# Patient Record
Sex: Female | Born: 1980 | Race: Black or African American | Hispanic: No | Marital: Married | Smoking: Never smoker
Health system: Southern US, Community
[De-identification: ages and names within clinical notes are randomized; demographics above are authoritative.]

## PROBLEM LIST (undated history)

## (undated) ENCOUNTER — Encounter: Attending: Family Medicine | Primary: Family Medicine

## (undated) ENCOUNTER — Encounter

## (undated) ENCOUNTER — Telehealth

## (undated) ENCOUNTER — Ambulatory Visit

## (undated) ENCOUNTER — Ambulatory Visit: Payer: BLUE CROSS/BLUE SHIELD | Attending: Family Medicine | Primary: Family Medicine

## (undated) ENCOUNTER — Ambulatory Visit: Payer: PRIVATE HEALTH INSURANCE

## (undated) ENCOUNTER — Telehealth: Attending: Family Medicine | Primary: Family Medicine

## (undated) ENCOUNTER — Ambulatory Visit: Payer: BLUE CROSS/BLUE SHIELD | Attending: Diagnostic Radiology | Primary: Diagnostic Radiology

## (undated) ENCOUNTER — Ambulatory Visit: Attending: Pharmacist | Primary: Pharmacist

## (undated) ENCOUNTER — Telehealth: Attending: Rheumatology | Primary: Rheumatology

## (undated) ENCOUNTER — Ambulatory Visit: Payer: PRIVATE HEALTH INSURANCE | Attending: Family Medicine | Primary: Family Medicine

## (undated) ENCOUNTER — Ambulatory Visit: Payer: BLUE CROSS/BLUE SHIELD

## (undated) ENCOUNTER — Ambulatory Visit: Attending: Family Medicine | Primary: Family Medicine

## (undated) DIAGNOSIS — R7303 Prediabetes: Secondary | ICD-10-CM

## (undated) DIAGNOSIS — D241 Benign neoplasm of right breast: Secondary | ICD-10-CM

## (undated) DIAGNOSIS — E559 Vitamin D deficiency, unspecified: Secondary | ICD-10-CM

## (undated) DIAGNOSIS — D242 Benign neoplasm of left breast: Secondary | ICD-10-CM

## (undated) DIAGNOSIS — K219 Gastro-esophageal reflux disease without esophagitis: Secondary | ICD-10-CM

## (undated) DIAGNOSIS — D869 Sarcoidosis, unspecified: Secondary | ICD-10-CM

## (undated) HISTORY — PX: TONSILLECTOMY: SUR1361

## (undated) HISTORY — DX: Sarcoidosis, unspecified: D86.9

## (undated) HISTORY — PX: BREAST EXCISIONAL BIOPSY: SUR124

## (undated) HISTORY — DX: Gastro-esophageal reflux disease without esophagitis: K21.9

## (undated) HISTORY — PX: BREAST BIOPSY: SHX20

## (undated) HISTORY — DX: Benign neoplasm of left breast: D24.2

## (undated) HISTORY — DX: Prediabetes: R73.03

## (undated) HISTORY — DX: Benign neoplasm of right breast: D24.1

## (undated) HISTORY — DX: Vitamin D deficiency, unspecified: E55.9

---

## 2000-07-15 HISTORY — PX: OTHER SURGICAL HISTORY: SHX169

## 2004-07-15 HISTORY — PX: WISDOM TOOTH EXTRACTION: SHX21

## 2004-08-31 ENCOUNTER — Emergency Department: Payer: Self-pay | Admitting: Emergency Medicine

## 2005-07-15 HISTORY — PX: UPPER GI ENDOSCOPY: SHX6162

## 2005-07-15 HISTORY — PX: DILATION AND CURETTAGE OF UTERUS: SHX78

## 2006-01-01 ENCOUNTER — Ambulatory Visit: Payer: Self-pay | Admitting: Family Medicine

## 2006-02-06 ENCOUNTER — Ambulatory Visit: Payer: Self-pay | Admitting: Obstetrics and Gynecology

## 2007-03-23 ENCOUNTER — Ambulatory Visit: Payer: Self-pay | Admitting: Ophthalmology

## 2009-07-15 HISTORY — PX: COLONOSCOPY: SHX174

## 2009-08-22 ENCOUNTER — Ambulatory Visit: Payer: Self-pay | Admitting: Rheumatology

## 2010-03-13 ENCOUNTER — Ambulatory Visit: Payer: Self-pay | Admitting: Emergency Medicine

## 2010-09-13 ENCOUNTER — Ambulatory Visit: Payer: Self-pay | Admitting: Oncology

## 2010-10-05 ENCOUNTER — Ambulatory Visit: Payer: Self-pay | Admitting: Oncology

## 2010-10-14 ENCOUNTER — Ambulatory Visit: Payer: Self-pay | Admitting: Oncology

## 2010-11-13 ENCOUNTER — Ambulatory Visit: Payer: Self-pay | Admitting: Oncology

## 2010-12-14 ENCOUNTER — Ambulatory Visit: Payer: Self-pay | Admitting: Oncology

## 2011-01-18 ENCOUNTER — Ambulatory Visit: Payer: Self-pay | Admitting: Oncology

## 2011-02-13 ENCOUNTER — Ambulatory Visit: Payer: Self-pay | Admitting: Oncology

## 2011-03-16 ENCOUNTER — Ambulatory Visit: Payer: Self-pay | Admitting: Oncology

## 2011-05-15 ENCOUNTER — Ambulatory Visit: Payer: Self-pay | Admitting: Oncology

## 2011-05-16 ENCOUNTER — Ambulatory Visit: Payer: Self-pay | Admitting: Oncology

## 2011-06-26 ENCOUNTER — Ambulatory Visit: Payer: Self-pay | Admitting: Oncology

## 2011-07-16 ENCOUNTER — Ambulatory Visit: Payer: Self-pay | Admitting: Oncology

## 2011-08-28 ENCOUNTER — Ambulatory Visit: Payer: Self-pay | Admitting: Oncology

## 2011-09-13 ENCOUNTER — Ambulatory Visit: Payer: Self-pay | Admitting: Oncology

## 2011-10-23 ENCOUNTER — Ambulatory Visit: Payer: Self-pay | Admitting: Oncology

## 2011-11-13 ENCOUNTER — Ambulatory Visit: Payer: Self-pay | Admitting: Oncology

## 2011-12-18 ENCOUNTER — Ambulatory Visit: Payer: Self-pay | Admitting: Oncology

## 2012-01-13 ENCOUNTER — Ambulatory Visit: Payer: Self-pay | Admitting: Oncology

## 2012-02-19 ENCOUNTER — Ambulatory Visit: Payer: Self-pay | Admitting: Oncology

## 2012-03-15 ENCOUNTER — Ambulatory Visit: Payer: Self-pay | Admitting: Oncology

## 2012-04-15 ENCOUNTER — Ambulatory Visit: Payer: Self-pay | Admitting: Oncology

## 2013-03-03 ENCOUNTER — Ambulatory Visit: Payer: Self-pay | Admitting: Otolaryngology

## 2015-02-13 ENCOUNTER — Other Ambulatory Visit: Payer: Self-pay | Admitting: Obstetrics and Gynecology

## 2015-02-13 DIAGNOSIS — N63 Unspecified lump in unspecified breast: Secondary | ICD-10-CM

## 2015-02-15 ENCOUNTER — Other Ambulatory Visit: Payer: Self-pay

## 2015-02-15 ENCOUNTER — Ambulatory Visit: Payer: Self-pay

## 2015-02-16 ENCOUNTER — Ambulatory Visit
Admission: RE | Admit: 2015-02-16 | Discharge: 2015-02-16 | Disposition: A | Discharge status: Home / Self Care | Payer: No Typology Code available for payment source | Source: Ambulatory Visit | Attending: Obstetrics and Gynecology | Admitting: Obstetrics and Gynecology

## 2015-02-16 DIAGNOSIS — N63 Unspecified lump in unspecified breast: Secondary | ICD-10-CM

## 2015-02-23 ENCOUNTER — Encounter: Payer: Self-pay | Admitting: *Deleted

## 2015-02-27 ENCOUNTER — Encounter: Payer: Self-pay | Admitting: General Surgery

## 2015-02-27 ENCOUNTER — Other Ambulatory Visit: Payer: PRIVATE HEALTH INSURANCE

## 2015-02-27 ENCOUNTER — Ambulatory Visit (INDEPENDENT_AMBULATORY_CARE_PROVIDER_SITE_OTHER): Payer: PRIVATE HEALTH INSURANCE | Admitting: General Surgery

## 2015-02-27 VITALS — BP 126/76 | Ht 70.0 in | Wt 219.0 lb

## 2015-02-27 DIAGNOSIS — N6320 Unspecified lump in the left breast, unspecified quadrant: Secondary | ICD-10-CM

## 2015-02-27 DIAGNOSIS — N6310 Unspecified lump in the right breast, unspecified quadrant: Secondary | ICD-10-CM

## 2015-02-27 DIAGNOSIS — I808 Phlebitis and thrombophlebitis of other sites: Secondary | ICD-10-CM

## 2015-02-27 DIAGNOSIS — N63 Unspecified lump in breast: Secondary | ICD-10-CM | POA: Diagnosis not present

## 2015-02-27 NOTE — Patient Instructions (Addendum)
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      CARE AFTER BREAST BIOPSY  1. Leave the dressing on that your doctor applied after surgery. It is waterproof. You may bathe, shower and/or swim. The dressing will probably remain intact until your return office visit. If the dressing comes off, you will see small strips of tape against your skin on the incision. Do not remove these strips.  2. You may want to use a gauze,cloth or similar protection in your bra to prevent rubbing against your dressing and incision. This is not necessary, but you may feel more comfortable doing so.  3. It is recommended that you wear a bra day and night to give support to the breast. This will prevent the weight of the breast from pulling on the incision.  4. Your breast will feel hard and lumpy under the incision. Do not be alarmed. This is the underlying stitching of tissue. Softening of this tissue will occur in time.  5. Make sure you call the office and schedule an appointment in one week after your surgery. The office phone number is [phone]. The nurses at [facility] may have already done this for you.  6. You will notice about a week after your office visit that the strips of the tape on your incision will begin to loosen. These may then be removed.  7. Report to your doctor any of the following:  * Severe pain not relieved by your pain medication  *Redness of the incision  * Drainage from the incision  *Fever greater than 101 degrees

## 2015-02-27 NOTE — Progress Notes (Signed)
Patient ID: Marie Moran, female   DOB: [birth year], 34 y.o.   MRN: [id number]  Chief Complaint  Patient presents with  . Follow-up    mammogram    HPI Marie Moran is a 34 y.o. female who presents for a breast evaluation. The most recent mammogram and left breast ultrasound was done on 02/16/15.  Patient does perform regular self breast checks and gets regular mammograms done.  Patient did feel a "knot" on her right breast near area of prior surgery and on her left breast about 6 months ago. Patient stated her left breast nipple is swollen and complains of pain and tenderness. She did feel area of concern prior to getting her mammogram.  , HPI  Past Medical History  Diagnosis Date  . Sarcoidosis     2009    Past Surgical History  Procedure Laterality Date  . Mass removed  2002  . Tonsillectomy    . Wisdom tooth extraction  2006  . Colonoscopy  2011    [facility]  . Breast biopsy Right neg    2002 Dr. Larinda Moran   . Upper gi endoscopy  2007  . Dilation and curettage of uterus  2007    Family History  Problem Relation Age of Onset  . Cancer Maternal Aunt     Breast   . Cancer Maternal Grandfather     lung  . Cancer Maternal Aunt     Breast     Social History Social History  Substance Use Topics  . Smoking status: Never Smoker   . Smokeless tobacco: None  . Alcohol Use: 0.0 oz/week    0 Standard drinks or equivalent per week    No Known Allergies  No current outpatient prescriptions on file.   No current facility-administered medications for this visit.    Review of Systems Review of Systems  Constitutional: Negative.   Respiratory: Negative.   Cardiovascular: Negative.     Blood pressure 126/76, height 5\' 10"  (1.778 m), weight 219 lb (99.338 kg), last menstrual period 01/25/2015.  Physical Exam Physical Exam  Constitutional: She is oriented to person, place, and time. She appears well-developed and well-nourished.  Eyes: Conjunctivae are  normal. No scleral icterus.  Neck: Neck supple.  Cardiovascular: Normal rate, regular rhythm and normal heart sounds.   Pulmonary/Chest: Effort normal and breath sounds normal. Right breast exhibits mass. Right breast exhibits no inverted nipple, no nipple discharge, no skin change and no tenderness. Left breast exhibits mass. Left breast exhibits no inverted nipple, no nipple discharge, no skin change and no tenderness.  3 cm mobile firm mass in left breast UOQ 2 cm mobile mass in right breast UOQ  Abdominal: Soft. Bowel sounds are normal. There is no tenderness.  Lymphadenopathy:    She has no cervical adenopathy.  Neurological: She is alert and oriented to person, place, and time.  Skin: Skin is warm and dry.    Data Reviewed Mammogram and [place] reviewed. 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 Also noted a 1 cm similar mass at 5ocl location near nipple on right  Assessment    1) Mass of right and left breast  2) Mondor's disease of left breast    Plan Recommended core biopsy of the large left breast mass. If no malignant features will plan for excision of all the masses in SDS. Pt advised fully and is agreeable.     US guided biopsy of left breast completed today,  specimen sent for  pathology Use ice pack and wear a snug bra for pain relief Take ibuprofen and use local heat for 5-7 days for Mondor's disease.      PCP:  Marie Moran 02/27/2015, 1:58 PM

## 2015-02-28 ENCOUNTER — Telehealth: Payer: Self-pay | Admitting: General Surgery

## 2015-02-28 NOTE — Telephone Encounter (Signed)
PT CALLED TODAY TO OBTAIN RESULTS FROM BR BX DONE 02-28-15 IN [facility].PLEASE GIVE PT A CALL.RESULTS ARE IN EPIC.

## 2015-02-28 NOTE — Telephone Encounter (Signed)
Notified patient as instructed, patient pleased. Discussed follow-up appointments, patient agrees  

## 2015-03-01 ENCOUNTER — Other Ambulatory Visit: Payer: Self-pay | Admitting: General Surgery

## 2015-03-01 ENCOUNTER — Telehealth: Payer: Self-pay | Admitting: *Deleted

## 2015-03-01 DIAGNOSIS — D241 Benign neoplasm of right breast: Secondary | ICD-10-CM

## 2015-03-01 DIAGNOSIS — D242 Benign neoplasm of left breast: Secondary | ICD-10-CM

## 2015-03-01 NOTE — Telephone Encounter (Signed)
Patient's surgery has been scheduled for 03-06-15 at [facility].

## 2015-03-01 NOTE — Telephone Encounter (Signed)
-----   Message from Christene Lye, MD sent at 02/28/2015  1:07 PM EDT ----- Please schedule excision breast masses bilateral in SDS. Left OR form on your desk.

## 2015-03-03 ENCOUNTER — Inpatient Hospital Stay
Admission: RE | Admit: 2015-03-03 | Discharge: 2015-03-03 | Disposition: A | Discharge status: Home / Self Care | Payer: No Typology Code available for payment source | Source: Ambulatory Visit

## 2015-03-03 DIAGNOSIS — Z803 Family history of malignant neoplasm of breast: Secondary | ICD-10-CM | POA: Diagnosis not present

## 2015-03-03 DIAGNOSIS — D869 Sarcoidosis, unspecified: Secondary | ICD-10-CM | POA: Diagnosis not present

## 2015-03-03 DIAGNOSIS — N62 Hypertrophy of breast: Secondary | ICD-10-CM | POA: Diagnosis not present

## 2015-03-03 DIAGNOSIS — D241 Benign neoplasm of right breast: Secondary | ICD-10-CM | POA: Diagnosis not present

## 2015-03-03 NOTE — Patient Instructions (Signed)
  Your procedure is scheduled on: 03/06/15 Mon  Report to Day Surgery. To find out your arrival time please call [phone] between 1PM - 3PM on 03/03/15 Fri.  Remember: Instructions that are not followed completely may result in serious medical risk, up to and including death, or upon the discretion of your surgeon and anesthesiologist your surgery may need to be rescheduled.    _x___ 1. Do not eat food or drink liquids after midnight. No gum chewing or hard candies.     ____ 2. No Alcohol for 24 hours before or after surgery.   ____ 3. Bring all medications with you on the day of surgery if instructed.    _x___ 4. Notify your doctor if there is any change in your medical condition     (cold, fever, infections).     Do not wear jewelry, make-up, hairpins, clips or nail polish.  Do not wear lotions, powders, or perfumes. You may wear deodorant.  Do not shave 48 hours prior to surgery. Men may shave face and neck.  Do not bring valuables to the hospital.    [facility] is not responsible for any belongings or valuables.               Contacts, dentures or bridgework may not be worn into surgery.  Leave your suitcase in the car. After surgery it may be brought to your room.  For patients admitted to the hospital, discharge time is determined by your                treatment team.   Patients discharged the day of surgery will not be allowed to drive home.   Please read over the following fact sheets that you were given:      ____ Take these medicines the morning of surgery with A SIP OF WATER:    1. None  2.   3.   4.  5.  6.  ____ Fleet Enema (as directed)   _x___ Use CHG Soap as directed Use Sage wipes day of surgery  ____ Use inhalers on the day of surgery  ____ Stop metformin 2 days prior to surgery    ____ Take 1/2 of usual insulin dose the night before surgery and none on the morning of surgery.   ____ Stop Coumadin/Plavix/aspirin on   ____ Stop  Anti-inflammatories on    ____ Stop supplements until after surgery.    ____ Bring C-Pap to the hospital.

## 2015-03-06 ENCOUNTER — Encounter
Admission: RE | Disposition: A | Discharge status: Home / Self Care | Payer: Self-pay | Source: Ambulatory Visit | Attending: General Surgery

## 2015-03-06 ENCOUNTER — Encounter: Payer: Self-pay | Admitting: *Deleted

## 2015-03-06 ENCOUNTER — Ambulatory Visit: Payer: No Typology Code available for payment source | Admitting: Anesthesiology

## 2015-03-06 ENCOUNTER — Ambulatory Visit
Admission: RE | Admit: 2015-03-06 | Discharge: 2015-03-06 | Disposition: A | Discharge status: Home / Self Care | Payer: No Typology Code available for payment source | Source: Ambulatory Visit | Attending: General Surgery | Admitting: General Surgery

## 2015-03-06 DIAGNOSIS — D242 Benign neoplasm of left breast: Secondary | ICD-10-CM

## 2015-03-06 DIAGNOSIS — D869 Sarcoidosis, unspecified: Secondary | ICD-10-CM | POA: Insufficient documentation

## 2015-03-06 DIAGNOSIS — N62 Hypertrophy of breast: Secondary | ICD-10-CM | POA: Insufficient documentation

## 2015-03-06 DIAGNOSIS — D241 Benign neoplasm of right breast: Secondary | ICD-10-CM | POA: Insufficient documentation

## 2015-03-06 DIAGNOSIS — Z803 Family history of malignant neoplasm of breast: Secondary | ICD-10-CM | POA: Insufficient documentation

## 2015-03-06 HISTORY — PX: BREAST BIOPSY: SHX20

## 2015-03-06 LAB — POCT PREGNANCY, URINE: Preg Test, Ur: NEGATIVE

## 2015-03-06 SURGERY — BREAST BIOPSY
Anesthesia: General | Laterality: Bilateral | Wound class: Clean

## 2015-03-06 MED ORDER — SUCCINYLCHOLINE CHLORIDE 20 MG/ML IJ SOLN
INTRAMUSCULAR | Status: DC | PRN
  Administered 2015-03-06: 10 mg via INTRAVENOUS

## 2015-03-06 MED ORDER — KETAMINE HCL 50 MG/ML IJ SOLN
INTRAMUSCULAR | Status: DC | PRN
  Administered 2015-03-06: 25 mg via INTRAMUSCULAR

## 2015-03-06 MED ORDER — FAMOTIDINE 20 MG PO TABS
20.0000 mg | ORAL_TABLET | Freq: Once | ORAL | Status: AC
  Administered 2015-03-06: 20 mg via ORAL

## 2015-03-06 MED ORDER — FAMOTIDINE 20 MG PO TABS
ORAL_TABLET | ORAL | Status: AC
  Administered 2015-03-06: 20 mg via ORAL
  Filled 2015-03-06: qty 1

## 2015-03-06 MED ORDER — ONDANSETRON HCL 4 MG/2ML IJ SOLN
INTRAMUSCULAR | Status: DC | PRN
  Administered 2015-03-06: 4 mg via INTRAVENOUS

## 2015-03-06 MED ORDER — KETOROLAC TROMETHAMINE 30 MG/ML IJ SOLN
INTRAMUSCULAR | Status: DC | PRN
  Administered 2015-03-06: 30 mg via INTRAVENOUS

## 2015-03-06 MED ORDER — PROPOFOL 10 MG/ML IV BOLUS
INTRAVENOUS | Status: DC | PRN
  Administered 2015-03-06: 150 mg via INTRAVENOUS

## 2015-03-06 MED ORDER — PHENYLEPHRINE HCL 10 MG/ML IJ SOLN
INTRAMUSCULAR | Status: DC | PRN
  Administered 2015-03-06 (×5): 100 ug via INTRAVENOUS

## 2015-03-06 MED ORDER — ACETAMINOPHEN 10 MG/ML IV SOLN
INTRAVENOUS | Status: DC | PRN
  Administered 2015-03-06: 1000 mg via INTRAVENOUS

## 2015-03-06 MED ORDER — FENTANYL CITRATE (PF) 100 MCG/2ML IJ SOLN: 25.0000 ug | INTRAMUSCULAR | Status: DC | PRN

## 2015-03-06 MED ORDER — ACETAMINOPHEN 10 MG/ML IV SOLN
INTRAVENOUS | Status: AC
  Filled 2015-03-06: qty 100

## 2015-03-06 MED ORDER — LIDOCAINE HCL (PF) 1 % IJ SOLN
INTRAMUSCULAR | Status: AC
  Filled 2015-03-06: qty 30

## 2015-03-06 MED ORDER — TRAMADOL HCL 50 MG PO TABS: 50.0000 mg | ORAL_TABLET | Freq: Four times a day (QID) | ORAL | Status: DC | PRN

## 2015-03-06 MED ORDER — FENTANYL CITRATE (PF) 100 MCG/2ML IJ SOLN
INTRAMUSCULAR | Status: DC | PRN
  Administered 2015-03-06 (×2): 25 ug via INTRAVENOUS
  Administered 2015-03-06: 50 ug via INTRAVENOUS
  Administered 2015-03-06 (×2): 25 ug via INTRAVENOUS

## 2015-03-06 MED ORDER — BUPIVACAINE HCL (PF) 0.5 % IJ SOLN
INTRAMUSCULAR | Status: DC | PRN
  Administered 2015-03-06: 15 mL

## 2015-03-06 MED ORDER — GLYCOPYRROLATE 0.2 MG/ML IJ SOLN
INTRAMUSCULAR | Status: DC | PRN
  Administered 2015-03-06: 0.2 mg via INTRAVENOUS

## 2015-03-06 MED ORDER — BUPIVACAINE HCL (PF) 0.5 % IJ SOLN
INTRAMUSCULAR | Status: AC
  Filled 2015-03-06: qty 30

## 2015-03-06 MED ORDER — DEXAMETHASONE SODIUM PHOSPHATE 4 MG/ML IJ SOLN
INTRAMUSCULAR | Status: DC | PRN
  Administered 2015-03-06: 5 mg via INTRAVENOUS

## 2015-03-06 MED ORDER — CHLORHEXIDINE GLUCONATE 4 % EX LIQD: 1.0000 "application " | Freq: Once | CUTANEOUS | Status: DC

## 2015-03-06 MED ORDER — MIDAZOLAM HCL 2 MG/2ML IJ SOLN
INTRAMUSCULAR | Status: DC | PRN
  Administered 2015-03-06: 2 mg via INTRAVENOUS

## 2015-03-06 MED ORDER — PROMETHAZINE HCL 25 MG/ML IJ SOLN: 6.2500 mg | INTRAMUSCULAR | Status: DC | PRN

## 2015-03-06 MED ORDER — LACTATED RINGERS IV SOLN
INTRAVENOUS | Status: DC
  Administered 2015-03-06: 08:00:00 via INTRAVENOUS

## 2015-03-06 MED ORDER — LIDOCAINE HCL (CARDIAC) 20 MG/ML IV SOLN
INTRAVENOUS | Status: DC | PRN
  Administered 2015-03-06: 100 mg via INTRAVENOUS

## 2015-03-06 SURGICAL SUPPLY — 32 items
BLADE SURG 15 STRL SS SAFETY (BLADE) ×3 IMPLANT
CANISTER SUCT 1200ML W/VALVE (MISCELLANEOUS) ×3 IMPLANT
CHLORAPREP W/TINT 26ML (MISCELLANEOUS) ×6 IMPLANT
CNTNR SPEC 2.5X3XGRAD LEK (MISCELLANEOUS) ×3
CONT SPEC 4OZ STER OR WHT (MISCELLANEOUS) ×6
CONTAINER SPEC 2.5X3XGRAD LEK (MISCELLANEOUS) ×3 IMPLANT
COVER PROBE FLX POLY STRL (MISCELLANEOUS) ×3 IMPLANT
DEVICE DUBIN SPECIMEN MAMMOGRA (MISCELLANEOUS) IMPLANT
DEVICE LOCALIZATION ULTRAWIRE (WIRE) IMPLANT
DEVICE ULTRAWIRE LOCAL 19X9 (WIRE) ×1 IMPLANT
DRAPE CHEST BREAST 77X106 FENE (MISCELLANEOUS) ×3 IMPLANT
DRAPE LAPAROTOMY 100X77 ABD (DRAPES) IMPLANT
GLOVE BIO SURGEON STRL SZ7 (GLOVE) ×9 IMPLANT
GOWN STRL REUS W/ TWL LRG LVL3 (GOWN DISPOSABLE) ×2 IMPLANT
GOWN STRL REUS W/TWL LRG LVL3 (GOWN DISPOSABLE) ×4
KIT RM TURNOVER STRD PROC AR (KITS) ×3 IMPLANT
LABEL OR SOLS (LABEL) ×3 IMPLANT
LIQUID BAND (GAUZE/BANDAGES/DRESSINGS) ×3 IMPLANT
MARGIN MAP 10MM (MISCELLANEOUS) ×3 IMPLANT
NDL SAFETY 22GX1.5 (NEEDLE) ×3 IMPLANT
NDL SAFETY 25GX1.5 (NEEDLE) ×3 IMPLANT
PACK BASIN MINOR ARMC (MISCELLANEOUS) ×3 IMPLANT
PAD GROUND ADULT SPLIT (MISCELLANEOUS) ×3 IMPLANT
SUT ETH BLK MONO 3 0 FS 1 12/B (SUTURE) ×3 IMPLANT
SUT VIC AB 2-0 CT1 (SUTURE) ×6 IMPLANT
SUT VIC AB 3-0 54X BRD REEL (SUTURE) ×1 IMPLANT
SUT VIC AB 3-0 BRD 54 (SUTURE) ×2
SUT VIC AB 4-0 FS2 27 (SUTURE) ×6 IMPLANT
SYR CONTROL 10ML (SYRINGE) ×3 IMPLANT
ULTRAWIRE LOCAL DEVICE 19X9 (WIRE) ×3
ULTRAWIRE LOCALIZATION DEVICE (WIRE)
WATER STERILE IRR 1000ML POUR (IV SOLUTION) ×3 IMPLANT

## 2015-03-06 NOTE — Anesthesia Preprocedure Evaluation (Signed)
Anesthesia Evaluation  Patient identified by MRN, date of birth, ID band Patient awake    Reviewed: Allergy & Precautions, H&P , NPO status , Patient's Chart, lab work & pertinent test results, reviewed documented beta blocker date and time   History of Anesthesia Complications (+) PONV and history of anesthetic complications  Airway Mallampati: II  TM Distance: >3 FB Neck ROM: full    Dental no notable dental hx. (+) Teeth Intact   Pulmonary neg pulmonary ROS,  breath sounds clear to auscultation  Pulmonary exam normal       Cardiovascular Exercise Tolerance: Good negative cardio ROS Normal cardiovascular examRhythm:regular Rate:Normal     Neuro/Psych negative neurological ROS  negative psych ROS   GI/Hepatic negative GI ROS, Neg liver ROS,   Endo/Other  negative endocrine ROS  Renal/GU negative Renal ROS  negative genitourinary   Musculoskeletal   Abdominal   Peds  Hematology negative hematology ROS (+)   Anesthesia Other Findings Past Medical History:   Sarcoidosis                                                    Comment:2009   Reproductive/Obstetrics negative OB ROS                             Anesthesia Physical Anesthesia Plan  ASA: II  Anesthesia Plan: General   Post-op Pain Management:    Induction:   Airway Management Planned:   Additional Equipment:   Intra-op Plan:   Post-operative Plan:   Informed Consent: I have reviewed the patients History and Physical, chart, labs and discussed the procedure including the risks, benefits and alternatives for the proposed anesthesia with the patient or authorized representative who has indicated his/her understanding and acceptance.   Dental Advisory Given  Plan Discussed with: Anesthesiologist, CRNA and Surgeon  Anesthesia Plan Comments:         Anesthesia Quick Evaluation

## 2015-03-06 NOTE — Anesthesia Procedure Notes (Signed)
Date/Time: 03/06/2015 8:48 AM Performed by: Rosaria Ferries, Hatice Bubel Pre-anesthesia Checklist: Patient identified Patient Re-evaluated:Patient Re-evaluated prior to inductionOxygen Delivery Method: Circle system utilized Preoxygenation: Pre-oxygenation with 100% oxygen Intubation Type: IV induction LMA: LMA inserted LMA Size: 4.0 Placement Confirmation: breath sounds checked- equal and bilateral

## 2015-03-06 NOTE — Interval H&P Note (Signed)
History and Physical Interval Note:  03/06/2015 8:18 AM  Marie Moran  has presented today for surgery, with the diagnosis of BENIGN NEOPLASMA BIL.BREAST  The various methods of treatment have been discussed with the patient and family. After consideration of risks, benefits and other options for treatment, the patient has consented to  Procedure(s): BREAST BIOPSY (Bilateral) as a surgical intervention .  The patient's history has been reviewed, patient examined, no change in status, stable for surgery.  I have reviewed the patient's chart and labs.  Questions were answered to the patient's satisfaction.     [name]

## 2015-03-06 NOTE — Op Note (Signed)
Preop diagnosis: Multiple breast fibroadenomas  Post op diagnosis: Same  Operation: Excision large left breast fibroadenoma and excision 2 fibroadenomas right breast  Surgeon: S.G.Sankar  Assistant:     Anesthesia: Gen.  Complications: None  EBL: Minimal  Drains: None  Description: Patient was put to sleep with an LMA and thereafter and both breasts were prepped and draped sterile field. This patient had a large palpable mass left breast which had grown significantly in size in the last year. Core biopsy of this showed this to be a fibroadenoma and by ultrasound measured over 3 cm. Patient also had a palpable mass in the outer portion of the right breast which was about 2 cm size and also no questionable finding just below the nipple area. Ultrasound confirmed the presence of the likely fibroadenomas in the right breast at the 10 and 5:00 location. Timeout was performed. A curvilinear incision was mapped out over the palpable mass in the upper-outer quadrant of the left breast and skin incision was made 10 mL of 50 half percent Marcaine was instilled. With careful exposure of the lobulated the 3 cm mass was identified and excised out completely and sent to pathology. Bleeding was controlled with cautery and ligatures of 2-0 Vicryl. The deeper tissues closed with interrupted 2-0 Vicryl and the skin with subcuticular 4-0 Vicryl. The palpable mass at the 10:00 location on the right breast was similarly dealt with after instillation of local anesthetic a curvilinear incision was made overlying this mass this mass was relatively superficial. This was adequately excised out and bleeding controlled with cautery. Again deep tissue closed with 2-0 Vicryl and the skin with subcuticular 4-0 Vicryl. The questionable finding moderate the 5:00 location was only identified with ultrasound. Accordingly the ultrasound probe was utilized in the 5 mm mass was identified in the 6:00 location about 4 cm from the  nipple. Following this a Bard ultralight Y was then utilized to localize this mass which was somewhat deeper location. Local anesthetic was instilled and a skin incision was made the wire was freed from the skin and using the wire as a guide dissection was carried out into the deeper portion of the gland. Neither thick part of the wire the fibroadenoma-like the 5 mm to 1 cm size mass was identified and this was excised out completely along with the wire in place. Hemostasis obtained with cautery deep tissue closed with 2-0 Vicryl and the skin with subcuticular 4-0 Vicryl. All skin incisions were covered with liqui  Ban. Patient subsequently was extubated and returned recovery room in stable condition.

## 2015-03-06 NOTE — Transfer of Care (Signed)
Immediate Anesthesia Transfer of Care Note  Patient: Marie Moran  Procedure(s) Performed: Procedure(s): BILATERAL BREAST MASS EXCISION, right breast mass excision with ultrasound guided needle localization  (Bilateral)  Patient Location: PACU  Anesthesia Type:General  Level of Consciousness: sedated  Airway & Oxygen Therapy: Patient Spontanous Breathing  Post-op Assessment: Report given to RN and Post -op Vital signs reviewed and stable  Post vital signs: Reviewed and stable  Last Vitals:  Filed Vitals:   03/06/15 1026  BP: 139/94  Pulse:   Temp: 36.3 C  Resp: 16    Complications: No apparent anesthesia complications

## 2015-03-06 NOTE — H&P (View-Only) (Signed)
Patient ID: Marie Moran, female   DOB: 1981/02/23, 35 y.o.   MRN: [id number]  Chief Complaint  Patient presents with  . Follow-up    mammogram    HPI Marie Moran is a 34 y.o. female who presents for a breast evaluation. The most recent mammogram and left breast ultrasound was done on 02/16/15.  Patient does perform regular self breast checks and gets regular mammograms done.  Patient did feel a "knot" on her right breast near area of prior surgery and on her left breast about 6 months ago. Patient stated her left breast nipple is swollen and complains of pain and tenderness. She did feel area of concern prior to getting her mammogram.  , HPI  Past Medical History  Diagnosis Date  . Sarcoidosis     2009    Past Surgical History  Procedure Laterality Date  . Mass removed  2002  . Tonsillectomy    . Wisdom tooth extraction  2006  . Colonoscopy  2011    [facility]  . Breast biopsy Right neg    2002 Dr. Larinda Buttery   . Upper gi endoscopy  2007  . Dilation and curettage of uterus  2007    Family History  Problem Relation Age of Onset  . Cancer Maternal Aunt     Breast   . Cancer Maternal Grandfather     lung  . Cancer Maternal Aunt     Breast     Social History Social History  Substance Use Topics  . Smoking status: Never Smoker   . Smokeless tobacco: None  . Alcohol Use: 0.0 oz/week    0 Standard drinks or equivalent per week    No Known Allergies  No current outpatient prescriptions on file.   No current facility-administered medications for this visit.    Review of Systems Review of Systems  Constitutional: Negative.   Respiratory: Negative.   Cardiovascular: Negative.     Blood pressure 126/76, height 5\' 10"  (1.778 m), weight 219 lb (99.338 kg), last menstrual period 01/25/2015.  Physical Exam Physical Exam  Constitutional: She is oriented to person, place, and time. She appears well-developed and well-nourished.  Eyes: Conjunctivae are  normal. No scleral icterus.  Neck: Neck supple.  Cardiovascular: Normal rate, regular rhythm and normal heart sounds.   Pulmonary/Chest: Effort normal and breath sounds normal. Right breast exhibits mass. Right breast exhibits no inverted nipple, no nipple discharge, no skin change and no tenderness. Left breast exhibits mass. Left breast exhibits no inverted nipple, no nipple discharge, no skin change and no tenderness.  3 cm mobile firm mass in left breast UOQ 2 cm mobile mass in right breast UOQ  Abdominal: Soft. Bowel sounds are normal. There is no tenderness.  Lymphadenopathy:    She has no cervical adenopathy.  Neurological: She is alert and oriented to person, place, and time.  Skin: Skin is warm and dry.    Data Reviewed Mammogram and [place] reviewed. 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 Also noted a 1 cm similar mass at 5ocl location near nipple on right  Assessment    1) Mass of right and left breast  2) Mondor's disease of left breast    Plan Recommended core biopsy of the large left breast mass. If no malignant features will plan for excision of all the masses in SDS. Pt advised fully and is agreeable.     US guided biopsy of left breast completed today,  specimen sent for  pathology Use ice pack and wear a snug bra for pain relief Take ibuprofen and use local heat for 5-7 days for Mondor's disease.      PCP:  Baldwin [place] G 02/27/2015, 1:58 PM

## 2015-03-06 NOTE — Discharge Instructions (Signed)
General Anesthesia °General anesthesia is a sleep-like state of non-feeling produced by medicines (anesthetics). General anesthesia prevents you from being alert and feeling pain during a medical procedure. Your caregiver may recommend general anesthesia if your procedure: °· Is long. °· Is painful or uncomfortable. °· Would be frightening to see or hear. °· Requires you to be still. °· Affects your breathing. °· Causes significant blood loss. °LET YOUR CAREGIVER KNOW ABOUT: °· Allergies to food or medicine. °· Medicines taken, including vitamins, herbs, eyedrops, over-the-counter medicines, and creams. °· Use of steroids (by mouth or creams). °· Previous problems with anesthetics or numbing medicines, including problems experienced by relatives. °· History of bleeding problems or blood clots. °· Previous surgeries and types of anesthetics received. °· Possibility of pregnancy, if this applies. °· Use of cigarettes, alcohol, or illegal drugs. °· Any health condition(s), especially diabetes, sleep apnea, and high blood pressure. °RISKS AND COMPLICATIONS °General anesthesia rarely causes complications. However, if complications do occur, they can be life threatening. Complications include: °· A lung infection. °· A stroke. °· A heart attack. °· Waking up during the procedure. When this occurs, the patient may be unable to move and communicate that he or she is awake. The patient may feel severe pain. °Older adults and adults with serious medical problems are more likely to have complications than adults who are young and healthy. Some complications can be prevented by answering all of your caregiver's questions thoroughly and by following all pre-procedure instructions. It is important to tell your caregiver if any of the pre-procedure instructions, especially those related to diet, were not followed. Any food or liquid in the stomach can cause problems when you are under general anesthesia. °BEFORE THE  PROCEDURE °· Ask your caregiver if you will have to spend the night at the hospital. If you will not have to spend the night, arrange to have an adult drive you and stay with you for 24 hours. °· Follow your caregiver's instructions if you are taking dietary supplements or medicines. Your caregiver may tell you to stop taking them or to reduce your dosage. °· Do not smoke for as long as possible before your procedure. If possible, stop smoking 3-6 weeks before the procedure. °· Do not take new dietary supplements or medicines within 1 week of your procedure unless your caregiver approves them. °· Do not eat within 8 hours of your procedure or as directed by your caregiver. Drink only clear liquids, such as water, black coffee (without milk or cream), and fruit juices (without pulp). °· Do not drink within 3 hours of your procedure or as directed by your caregiver. °· You may brush your teeth on the morning of the procedure, but make sure to spit out the toothpaste and water when finished. °PROCEDURE  °You will receive anesthetics through a mask, through an intravenous (IV) access tube, or through both. A doctor who specializes in anesthesia (anesthesiologist) or a nurse who specializes in anesthesia (nurse anesthetist) or both will stay with you throughout the procedure to make sure you remain unconscious. He or she will also watch your blood pressure, pulse, and oxygen levels to make sure that the anesthetics do not cause any problems. Once you are asleep, a breathing tube or mask may be used to help you breathe. °AFTER THE PROCEDURE °You will wake up after the procedure is complete. You may be in the room where the procedure was performed or in a recovery area. You may have a sore throat if   a breathing tube was used. You may also feel: °· Dizzy. °· Weak. °· Drowsy. °· Confused. °· Nauseous. °· Cold. °These are all normal responses and can be expected to last for up to 24 hours after the procedure is complete. A  caregiver will tell you when you are ready to go home. This will usually be when you are fully awake and in stable condition. °Document Released: 10/08/2007 Document Revised: 11/15/2013 Document Reviewed: 10/30/2011 °[employer]® Patient Information ©2015 [employer]. This information is not intended to replace advice given to you by your health care provider. Make sure you discuss any questions you have with your health care provider. ° °

## 2015-03-07 LAB — SURGICAL PATHOLOGY

## 2015-03-08 ENCOUNTER — Telehealth: Payer: Self-pay | Admitting: *Deleted

## 2015-03-08 NOTE — Telephone Encounter (Signed)
-----   Message from Christene Lye, MD sent at 03/08/2015  8:03 AM EDT ----- Please let pt pt know the pathology was normal.

## 2015-03-08 NOTE — Telephone Encounter (Signed)
Notified patient as instructed, patient pleased. Discussed follow-up appointments, patient agrees  

## 2015-03-09 NOTE — Anesthesia Postprocedure Evaluation (Signed)
  Anesthesia Post-op Note  Patient: Marie Moran  Procedure(s) Performed: Procedure(s): BILATERAL BREAST MASS EXCISION, right breast mass excision with ultrasound guided needle localization  (Bilateral)  Anesthesia type:General  Patient location: PACU  Post pain: Pain level controlled  Post assessment: Post-op Vital signs reviewed, Patient's Cardiovascular Status Stable, Respiratory Function Stable, Patent Airway and No signs of Nausea or vomiting  Post vital signs: Reviewed and stable  Last Vitals:  Filed Vitals:   03/06/15 1242  BP: 130/75  Pulse: 75  Temp:   Resp: 16    Level of consciousness: awake, alert  and patient cooperative  Complications: No apparent anesthesia complications

## 2015-03-22 ENCOUNTER — Ambulatory Visit (INDEPENDENT_AMBULATORY_CARE_PROVIDER_SITE_OTHER): Payer: PRIVATE HEALTH INSURANCE | Admitting: General Surgery

## 2015-03-22 ENCOUNTER — Encounter: Payer: Self-pay | Admitting: General Surgery

## 2015-03-22 VITALS — BP 116/78 | HR 88 | Resp 14 | Wt 223.0 lb

## 2015-03-22 DIAGNOSIS — D241 Benign neoplasm of right breast: Secondary | ICD-10-CM

## 2015-03-22 DIAGNOSIS — D242 Benign neoplasm of left breast: Secondary | ICD-10-CM

## 2015-03-22 NOTE — Progress Notes (Signed)
Patient ID: Marie Moran, female   DOB: Oct 05, 1980, 34 y.o.   MRN: [id number]  Chief Complaint  Patient presents with  . Routine Post Op    HPI Marie Moran is a 34 y.o. female here for follow up from a bilateral fibroadenoma excision. She reports that she is doing well with minimal discomfort. She does note a bruise on her left breast. She also states that she is still having pain in her left nipple.  HPI  Past Medical History  Diagnosis Date  . Sarcoidosis     2009    Past Surgical History  Procedure Laterality Date  . Mass removed  2002  . Tonsillectomy    . Wisdom tooth extraction  2006  . Colonoscopy  2011    [facility]  . Breast biopsy Right neg    2002 Dr. Larinda Buttery   . Upper gi endoscopy  2007  . Dilation and curettage of uterus  2007  . Breast biopsy Bilateral 03/06/2015    Procedure: BILATERAL BREAST MASS EXCISION, right breast mass excision with ultrasound guided needle localization ;  Surgeon: Christene Lye, MD;  Location: [facility] ORS;  Service: General;  Laterality: Bilateral;    Family History  Problem Relation Age of Onset  . Cancer Maternal Aunt     Breast   . Cancer Maternal Grandfather     lung  . Cancer Maternal Aunt     Breast     Social History Social History  Substance Use Topics  . Smoking status: Never Smoker   . Smokeless tobacco: Never Used  . Alcohol Use: 0.0 oz/week    0 Standard drinks or equivalent per week    No Known Allergies  Current Outpatient Prescriptions  Medication Sig Dispense Refill  . traMADol (ULTRAM) 50 MG tablet Take 1 tablet (50 mg total) by mouth every 6 (six) hours as needed. 30 tablet 0   No current facility-administered medications for this visit.    Review of Systems Review of Systems  Constitutional: Negative.   Respiratory: Negative.   Cardiovascular: Negative.     Blood pressure 116/78, pulse 88, resp. rate 14, weight 223 lb (101.152 kg), last menstrual period 02/27/2015.  Physical  Exam Physical Exam  Constitutional: She is oriented to person, place, and time. She appears well-developed and well-nourished.  Neurological: She is alert and oriented to person, place, and time.  Skin: Skin is warm and dry.  Psychiatric: She has a normal mood and affect.  breast incisions are all clean, intact and healing well.  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 Pt has vey large breasts with back symptoms. She likely will benefit with reduction. Pt is wishing to pursue this.     Plan    Patient to see a Psychiatric nurse. Follow up here in 1 yr.      PCP: None at present   [place] 03/23/2015, 8:11 AM

## 2015-03-23 ENCOUNTER — Encounter: Payer: Self-pay | Admitting: General Surgery

## 2015-05-24 ENCOUNTER — Emergency Department
Admission: EM | Admit: 2015-05-24 | Discharge: 2015-05-24 | Disposition: A | Discharge status: Home / Self Care | Payer: Self-pay | Attending: Emergency Medicine | Admitting: Emergency Medicine

## 2015-05-24 ENCOUNTER — Emergency Department: Payer: Self-pay

## 2015-05-24 DIAGNOSIS — J069 Acute upper respiratory infection, unspecified: Secondary | ICD-10-CM | POA: Insufficient documentation

## 2015-05-24 MED ORDER — HYDROCOD POLST-CPM POLST ER 10-8 MG/5ML PO SUER: 5.0000 mL | Freq: Two times a day (BID) | ORAL | Status: DC

## 2015-05-24 MED ORDER — HYDROCOD POLST-CPM POLST ER 10-8 MG/5ML PO SUER
5.0000 mL | Freq: Once | ORAL | Status: AC
  Administered 2015-05-24: 5 mL via ORAL
  Filled 2015-05-24: qty 5

## 2015-05-24 NOTE — Discharge Instructions (Signed)
Upper Respiratory Infection, Adult Most upper respiratory infections (URIs) are a viral infection of the air passages leading to the lungs. A URI affects the nose, throat, and upper air passages. The most common type of URI is nasopharyngitis and is typically referred to as "the common cold." URIs run their course and usually go away on their own. Most of the time, a URI does not require medical attention, but sometimes a bacterial infection in the upper airways can follow a viral infection. This is called a secondary infection. Sinus and middle ear infections are common types of secondary upper respiratory infections. Bacterial pneumonia can also complicate a URI. A URI can worsen asthma and chronic obstructive pulmonary disease (COPD). Sometimes, these complications can require emergency medical care and may be life threatening.  CAUSES Almost all URIs are caused by viruses. A virus is a type of germ and can spread from one person to another.  RISKS FACTORS You may be at risk for a URI if:   You smoke.   You have chronic heart or lung disease.  You have a weakened defense (immune) system.   You are very young or very old.   You have nasal allergies or asthma.  You work in crowded or poorly ventilated areas.  You work in health care facilities or schools. SIGNS AND SYMPTOMS  Symptoms typically develop 2-3 days after you come in contact with a cold virus. Most viral URIs last 7-10 days. However, viral URIs from the influenza virus (flu virus) can last 14-18 days and are typically more severe. Symptoms may include:   Runny or stuffy (congested) nose.   Sneezing.   Cough.   Sore throat.   Headache.   Fatigue.   Fever.   Loss of appetite.   Pain in your forehead, behind your eyes, and over your cheekbones (sinus pain).  Muscle aches.  DIAGNOSIS  Your health care provider may diagnose a URI by:  Physical exam.  Tests to check that your symptoms are not due to  another condition such as:  Strep throat.  Sinusitis.  Pneumonia.  Asthma. TREATMENT  A URI goes away on its own with time. It cannot be cured with medicines, but medicines may be prescribed or recommended to relieve symptoms. Medicines may help:  Reduce your fever.  Reduce your cough.  Relieve nasal congestion. HOME CARE INSTRUCTIONS   Take medicines only as directed by your health care provider.   Gargle warm saltwater or take cough drops to comfort your throat as directed by your health care provider.  Use a warm mist humidifier or inhale steam from a shower to increase air moisture. This may make it easier to breathe.  Drink enough fluid to keep your urine clear or pale yellow.   Eat soups and other clear broths and maintain good nutrition.   Rest as needed.   Return to work when your temperature has returned to normal or as your health care provider advises. You may need to stay home longer to avoid infecting others. You can also use a face mask and careful hand washing to prevent spread of the virus.  Increase the usage of your inhaler if you have asthma.   Do not use any tobacco products, including cigarettes, chewing tobacco, or electronic cigarettes. If you need help quitting, ask your health care provider. PREVENTION  The best way to protect yourself from getting a cold is to practice good hygiene.   Avoid oral or hand contact with people with cold   symptoms.   Wash your hands often if contact occurs.  There is no clear evidence that vitamin C, vitamin E, echinacea, or exercise reduces the chance of developing a cold. However, it is always recommended to get plenty of rest, exercise, and practice good nutrition.  SEEK MEDICAL CARE IF:   You are getting worse rather than better.   Your symptoms are not controlled by medicine.   You have chills.  You have worsening shortness of breath.  You have brown or red mucus.  You have yellow or brown nasal  discharge.  You have pain in your face, especially when you bend forward.  You have a fever.  You have swollen neck glands.  You have pain while swallowing.  You have white areas in the back of your throat. SEEK IMMEDIATE MEDICAL CARE IF:   You have severe or persistent:  Headache.  Ear pain.  Sinus pain.  Chest pain.  You have chronic lung disease and any of the following:  Wheezing.  Prolonged cough.  Coughing up blood.  A change in your usual mucus.  You have a stiff neck.  You have changes in your:  Vision.  Hearing.  Thinking.  Mood. MAKE SURE YOU:   Understand these instructions.  Will watch your condition.  Will get help right away if you are not doing well or get worse.   This information is not intended to replace advice given to you by your health care provider. Make sure you discuss any questions you have with your health care provider.   Document Released: 12/25/2000 Document Revised: 11/15/2014 Document Reviewed: 10/06/2013 Elsevier Interactive Patient Education 2016 [employer]  

## 2015-05-24 NOTE — ED Notes (Signed)
Patient comes in with complaints of shortness of breath and cough for 2 days.

## 2015-05-24 NOTE — ED Provider Notes (Signed)
[facility] Emergency Department Provider Note  ____________________________________________  Time seen: Approximately 1:52 PM  I have reviewed the triage vital signs and the nursing notes.   HISTORY  Chief Complaint Shortness of Breath    HPI Marie Moran is a 34 y.o. female patient complaining of dyspnea and intermitting productive/nonproductive cough for 2 days. Patient state shortness of breath is progressively worsening over 2 weeks. Patient attributed to the dyspnea secondary to sarcoidosis. No palliative measures taken for this complaint. Patient rates her pain discomfort as a 4/10.   Past Medical History  Diagnosis Date  . Sarcoidosis ([place])     2009    There are no active problems to display for this patient.   Past Surgical History  Procedure Laterality Date  . Mass removed  2002  . Tonsillectomy    . Wisdom tooth extraction  2006  . Colonoscopy  2011    [facility]  . Breast biopsy Right neg    2002 Dr. Larinda Buttery   . Upper gi endoscopy  2007  . Dilation and curettage of uterus  2007  . Breast biopsy Bilateral 03/06/2015    Procedure: BILATERAL BREAST MASS EXCISION, right breast mass excision with ultrasound guided needle localization ;  Surgeon: Christene Lye, MD;  Location: [facility] ORS;  Service: General;  Laterality: Bilateral;    Current Outpatient Rx  Name  Route  Sig  Dispense  Refill  . traMADol (ULTRAM) 50 MG tablet   Oral   Take 1 tablet (50 mg total) by mouth every 6 (six) hours as needed.   30 tablet   0     Allergies Review of patient's allergies indicates no known allergies.  Family History  Problem Relation Age of Onset  . Cancer Maternal Aunt     Breast   . Cancer Maternal Grandfather     lung  . Cancer Maternal Aunt     Breast     Social History Social History  Substance Use Topics  . Smoking status: Never Smoker   . Smokeless tobacco: Never Used  . Alcohol Use: 0.0 oz/week    0 Standard  drinks or equivalent per week    Review of Systems Constitutional: No fever/chills Eyes: No visual changes. ENT: No sore throat. Cardiovascular: Denies chest pain. Respiratory: Shortness of breath and coughing. Gastrointestinal: No abdominal pain.  No nausea, no vomiting.  No diarrhea.  No constipation. Genitourinary: Negative for dysuria. Musculoskeletal: Negative for back pain. Skin: Negative for rash. Neurological: Negative for headaches, focal weakness or numbness. 10-point ROS otherwise negative.  ____________________________________________   PHYSICAL EXAM:  VITAL SIGNS: ED Triage Vitals  Enc Vitals Group     BP 05/24/15 1225 143/84 mmHg     Pulse Rate 05/24/15 1225 84     Resp 05/24/15 1225 18     Temp 05/24/15 1225 98.3 F (36.8 C)     Temp Source 05/24/15 1225 Oral     SpO2 05/24/15 1225 100 %     Weight 05/24/15 1225 220 lb (99.791 kg)     Height 05/24/15 1225 5\' 8"  (1.727 m)     Head Cir --      Peak Flow --      Pain Score 05/24/15 1232 4     Pain Loc --      Pain Edu? --      Excl. in [place]? --     Constitutional: Alert and oriented. Well appearing and in no acute distress. Eyes: Conjunctivae  are normal. PERRL. EOMI. Head: Atraumatic. Nose: No congestion/rhinnorhea. Mouth/Throat: Mucous membranes are moist.  Oropharynx non-erythematous. Neck: No stridor.  No cervical spine tenderness to palpation Hematological/Lymphatic/Immunilogical: No cervical lymphadenopathy. Cardiovascular: Normal rate, regular rhythm. Grossly normal heart sounds.  Good peripheral circulation. Respiratory: Normal respiratory effort.  No retractions. Lungs CTAB. Nonproductive cough Gastrointestinal: Soft and nontender. No distention. No abdominal bruits. No CVA tenderness. Musculoskeletal: No lower extremity tenderness nor edema.  No joint effusions. Neurologic:  Normal speech and language. No gross focal neurologic deficits are appreciated. No gait instability. Skin:  Skin is warm,  dry and intact. No rash noted. Psychiatric: Mood and affect are normal. Speech and behavior are normal.  ____________________________________________   LABS (all labs ordered are listed, but only abnormal results are displayed)  Labs Reviewed - No data to display ____________________________________________  EKG   ____________________________________________  RADIOLOGY  No acute findings on chest x-ray ____________________________________________   PROCEDURES  Procedure(s) performed: None  Critical Care performed: No  ____________________________________________   INITIAL IMPRESSION / ASSESSMENT AND PLAN / ED COURSE  Pertinent labs & imaging results that were available during my care of the patient were reviewed by me and considered in my medical decision making (see chart for details).  Upper respiratory infection. Discussed negative chest x-ray findings with patient. Patient given a prescription for Tessalon for cough. Patient advised follow-up for her PCP for continued care. Return to ER if condition worsens. ____________________________________________   FINAL CLINICAL IMPRESSION(S) / ED DIAGNOSES  Final diagnoses:  Upper respiratory infection      Sable Feil, [name] 05/24/15 1510  Daymon Larsen, MD 05/24/15 1531

## 2015-06-04 ENCOUNTER — Emergency Department
Admission: EM | Admit: 2015-06-04 | Discharge: 2015-06-04 | Disposition: A | Discharge status: Home / Self Care | Payer: No Typology Code available for payment source | Attending: Emergency Medicine | Admitting: Emergency Medicine

## 2015-06-04 ENCOUNTER — Encounter: Payer: Self-pay | Admitting: Emergency Medicine

## 2015-06-04 ENCOUNTER — Emergency Department: Payer: No Typology Code available for payment source

## 2015-06-04 DIAGNOSIS — J209 Acute bronchitis, unspecified: Secondary | ICD-10-CM

## 2015-06-04 MED ORDER — PREDNISONE 10 MG PO TABS: 50.0000 mg | ORAL_TABLET | Freq: Every day | ORAL | Status: DC

## 2015-06-04 MED ORDER — AZITHROMYCIN 250 MG PO TABS: ORAL_TABLET | ORAL | Status: DC

## 2015-06-04 MED ORDER — GUAIFENESIN-CODEINE 100-10 MG/5ML PO SOLN: 10.0000 mL | Freq: Three times a day (TID) | ORAL | Status: DC | PRN

## 2015-06-04 MED ORDER — ALBUTEROL SULFATE HFA 108 (90 BASE) MCG/ACT IN AERS: 2.0000 | INHALATION_SPRAY | RESPIRATORY_TRACT | Status: DC | PRN

## 2015-06-04 MED ORDER — ALBUTEROL SULFATE (2.5 MG/3ML) 0.083% IN NEBU
2.5000 mg | INHALATION_SOLUTION | Freq: Once | RESPIRATORY_TRACT | Status: AC
  Administered 2015-06-04: 2.5 mg via RESPIRATORY_TRACT
  Filled 2015-06-04: qty 3

## 2015-06-04 MED ORDER — CIPROFLOXACIN HCL 0.3 % OP SOLN: 1.0000 [drp] | OPHTHALMIC | Status: AC

## 2015-06-04 NOTE — ED Provider Notes (Signed)
[facility] Emergency Department Provider Note ____________________________________________  Time seen: Approximately 9:23 AM  I have reviewed the triage vital signs and the nursing notes.   HISTORY  Chief Complaint URI   HPI Marie Moran is a 34 y.o. female who presents to the emergency department for evaluation of wheezing, shortness of breath, and pain in her ribs. She was evaluated here on 05/24/2015 for the same. She states that she began to feel better until 2 days ago and the symptoms returned, but worse. She states that she is increasingly short of breath and feels like she is being kicked in the ribs. She has not seen her physician or pulmonologist due to lack of insurance.   Past Medical History  Diagnosis Date  . Sarcoidosis ([place])     2009    There are no active problems to display for this patient.   Past Surgical History  Procedure Laterality Date  . Mass removed  2002  . Tonsillectomy    . Wisdom tooth extraction  2006  . Colonoscopy  2011    [facility]  . Breast biopsy Right neg    2002 Dr. Larinda Buttery   . Upper gi endoscopy  2007  . Dilation and curettage of uterus  2007  . Breast biopsy Bilateral 03/06/2015    Procedure: BILATERAL BREAST MASS EXCISION, right breast mass excision with ultrasound guided needle localization ;  Surgeon: Christene Lye, MD;  Location: [facility] ORS;  Service: General;  Laterality: Bilateral;    Current Outpatient Rx  Name  Route  Sig  Dispense  Refill  . albuterol (PROVENTIL HFA;VENTOLIN HFA) 108 (90 BASE) MCG/ACT inhaler   Inhalation   Inhale 2 puffs into the lungs every 4 (four) hours as needed for wheezing or shortness of breath.   1 Inhaler   2   . azithromycin (ZITHROMAX) 250 MG tablet      2 tablets today, then 1 tablet for the next 4 days.   6 each   0   . ciprofloxacin (CILOXAN) 0.3 % ophthalmic solution   Right Eye   Place 1 drop into the right eye every 4 (four) hours while  awake. Administer 1 drop, every 2 hours, while awake, for 2 days. Then 1 drop, every 4 hours, while awake, for the next 5 days.   5 mL   0   . guaiFENesin-codeine 100-10 MG/5ML syrup   Oral   Take 10 mLs by mouth 3 (three) times daily as needed.   120 mL   0   . predniSONE (DELTASONE) 10 MG tablet   Oral   Take 5 tablets (50 mg total) by mouth daily.   25 tablet   0     Allergies Review of patient's allergies indicates no known allergies.  Family History  Problem Relation Age of Onset  . Cancer Maternal Aunt     Breast   . Cancer Maternal Grandfather     lung  . Cancer Maternal Aunt     Breast     Social History Social History  Substance Use Topics  . Smoking status: Never Smoker   . Smokeless tobacco: Never Used  . Alcohol Use: 0.0 oz/week    0 Standard drinks or equivalent per week    Review of Systems Constitutional: No fever/chills Eyes: No visual changes. Left is tender and red 2 days ENT: No sore throat. Cardiovascular: Positive for chest pain. Respiratory: Positive for shortness of breath. Positive for cough. Gastrointestinal: Negative for  abdominal pain. Negative for nausea,  negative for vomiting.  Negative for diarrhea.  Genitourinary: Negative for dysuria. Musculoskeletal: Negative for body aches Skin: Negative for rash. Neurological: Negative for headaches, Negative for focal weakness or numbness.  10-point ROS otherwise negative.  ____________________________________________   PHYSICAL EXAM:  VITAL SIGNS: ED Triage Vitals  Enc Vitals Group     BP 06/04/15 0910 160/96 mmHg     Pulse Rate 06/04/15 0910 94     Resp 06/04/15 0910 20     Temp 06/04/15 0910 98.4 F (36.9 C)     Temp src --      SpO2 06/04/15 0910 97 %     Weight 06/04/15 0910 198 lb (89.812 kg)     Height 06/04/15 0910 5\' 8"  (1.727 m)     Head Cir --      Peak Flow --      Pain Score 06/04/15 0911 9     Pain Loc --      Pain Edu? --      Excl. in [place]? --      Constitutional: Alert and oriented. Well appearing and in no acute distress. Eyes: Left conjunctiva is erythematous and the medial aspect. PERRL. EOMI. Head: Atraumatic. Nose: No congestion/rhinnorhea. Mouth/Throat: Mucous membranes are moist.  Oropharynx non-erythematous. Neck: No stridor.  Lymphatic: Negative cervical lymphadenopathy. Cardiovascular: Normal rate, regular rhythm. Grossly normal heart sounds.  Good peripheral circulation. Respiratory: Normal respiratory effort.  No retractions. Lungs wheezes noted throughout with decreased air movement in the left lower lobe. Gastrointestinal: Soft and nontender. No distention. No abdominal bruits. No CVA tenderness. Musculoskeletal: No joint pain reported. Neurologic:  Normal speech and language. No gross focal neurologic deficits are appreciated. Speech is normal. No gait instability. Skin:  Skin is warm, dry and intact. No rash noted. Psychiatric: Mood and affect are normal. Speech and behavior are normal.  ____________________________________________   LABS (all labs ordered are listed, but only abnormal results are displayed)  Labs Reviewed - No data to display ____________________________________________  EKG   Date: 06/04/2015  Rate: 90  Rhythm: normal sinus rhythm  QRS Axis: normal  Intervals: normal  ST/T Wave abnormalities: normal  Conduction Disutrbances: none  Narrative Interpretation: Normal sinus rhythm with sinus arrhythmia     ____________________________________________  RADIOLOGY  Peribronchial thickening consistent with bronchitis. ____________________________________________   PROCEDURES  Procedure(s) performed: None  Critical Care performed: No  ____________________________________________   INITIAL IMPRESSION / ASSESSMENT AND PLAN / ED COURSE  Pertinent labs & imaging results that were available during my care of the patient were reviewed by me and considered in my medical decision  making (see chart for details.)  We will repeat a chest x-ray today. She will receive an albuterol treatment as well.   10:30 AM:  Will discharge home. X-ray shows bronchial changes. Will treat with azithromycin, prednisone, albuterol, and Robitussin AC. Patient instructed to follow up with PCP or return to the ER for symptoms that change or worsen if unable to schedule an appointment. ____________________________________________   FINAL CLINICAL IMPRESSION(S) / ED DIAGNOSES  Final diagnoses:  Bronchitis, acute, with bronchospasm       Victorino Dike, FNP 06/04/15 1119  Delman Kitten, MD 06/04/15 1649

## 2015-06-04 NOTE — ED Provider Notes (Signed)
ED ECG REPORT I, Ramaya Guile, the attending physician, personally viewed and interpreted this ECG.  Date: 06/04/2015 EKG Time: 0915 Rate: 90 Rhythm: normal sinus rhythm QRS Axis: normal Intervals: normal ST/T Wave abnormalities: normal Conduction Disutrbances: none Narrative Interpretation: unremarkable  Medical screening examination/treatment/procedure(s) were performed by non-physician practitioner and as supervising physician I was immediately available for consultation/collaboration.     Delman Kitten, MD 06/04/15 1101

## 2015-06-04 NOTE — ED Notes (Addendum)
Was seen about 1-2 weeks ago with some SOb and chest congestion and discomfort   Non prod cough no fever but is having occasional wheezing .pain to rib area from cough

## 2015-06-04 NOTE — Discharge Instructions (Signed)

## 2015-06-21 ENCOUNTER — Emergency Department (HOSPITAL_COMMUNITY): Payer: No Typology Code available for payment source

## 2015-06-21 ENCOUNTER — Encounter (HOSPITAL_COMMUNITY): Payer: Self-pay | Admitting: Emergency Medicine

## 2015-06-21 ENCOUNTER — Emergency Department (HOSPITAL_COMMUNITY)
Admission: EM | Admit: 2015-06-21 | Discharge: 2015-06-21 | Disposition: A | Discharge status: Home / Self Care | Payer: No Typology Code available for payment source | Attending: Emergency Medicine | Admitting: Emergency Medicine

## 2015-06-21 DIAGNOSIS — Z79899 Other long term (current) drug therapy: Secondary | ICD-10-CM | POA: Diagnosis not present

## 2015-06-21 DIAGNOSIS — D869 Sarcoidosis, unspecified: Secondary | ICD-10-CM | POA: Diagnosis not present

## 2015-06-21 DIAGNOSIS — H109 Unspecified conjunctivitis: Secondary | ICD-10-CM | POA: Diagnosis not present

## 2015-06-21 DIAGNOSIS — Z7952 Long term (current) use of systemic steroids: Secondary | ICD-10-CM | POA: Insufficient documentation

## 2015-06-21 DIAGNOSIS — R0789 Other chest pain: Secondary | ICD-10-CM

## 2015-06-21 DIAGNOSIS — R079 Chest pain, unspecified: Secondary | ICD-10-CM | POA: Diagnosis present

## 2015-06-21 LAB — I-STAT TROPONIN, ED: TROPONIN I, POC: 0 ng/mL (ref 0.00–0.08)

## 2015-06-21 LAB — CBC
HCT: 35.7 % — ABNORMAL LOW (ref 36.0–46.0)
Hemoglobin: 11.1 g/dL — ABNORMAL LOW (ref 12.0–15.0)
MCH: 23.2 pg — ABNORMAL LOW (ref 26.0–34.0)
MCHC: 31.1 g/dL (ref 30.0–36.0)
MCV: 74.7 fL — ABNORMAL LOW (ref 78.0–100.0)
PLATELETS: 435 10*3/uL — AB (ref 150–400)
RBC: 4.78 MIL/uL (ref 3.87–5.11)
RDW: 16.1 % — ABNORMAL HIGH (ref 11.5–15.5)
WBC: 8 10*3/uL (ref 4.0–10.5)

## 2015-06-21 LAB — BASIC METABOLIC PANEL
Anion gap: 7 (ref 5–15)
BUN: 9 mg/dL (ref 6–20)
CALCIUM: 9.3 mg/dL (ref 8.9–10.3)
CO2: 26 mmol/L (ref 22–32)
CREATININE: 0.7 mg/dL (ref 0.44–1.00)
Chloride: 103 mmol/L (ref 101–111)
GFR calc non Af Amer: >60 mL/min (ref >60)
Glucose, Bld: 106 mg/dL — ABNORMAL HIGH (ref 65–99)
Potassium: 4.4 mmol/L (ref 3.5–5.1)
SODIUM: 136 mmol/L (ref 135–145)

## 2015-06-21 MED ORDER — PREDNISONE 10 MG PO TABS: ORAL_TABLET | ORAL | Status: DC

## 2015-06-21 MED ORDER — PREDNISONE 20 MG PO TABS
60.0000 mg | ORAL_TABLET | Freq: Once | ORAL | Status: AC
  Administered 2015-06-21: 60 mg via ORAL
  Filled 2015-06-21: qty 3

## 2015-06-21 MED ORDER — METHYLPREDNISOLONE SODIUM SUCC 125 MG IJ SOLR: 60.0000 mg | Freq: Once | INTRAMUSCULAR | Status: DC

## 2015-06-21 MED ORDER — PREDNISONE 20 MG PO TABS: 60.0000 mg | ORAL_TABLET | Freq: Once | ORAL | Status: DC

## 2015-06-21 NOTE — ED Notes (Signed)
Pt has history of sarcoidosis and three days ago started having right and left sided chest pain under breast that is aching in nature. Pt is also short of breath when pain comes. Pt is warm and dry.

## 2015-06-21 NOTE — ED Provider Notes (Signed)
CSN: [id number]     Arrival date & time 06/21/15  1252 History   First MD Initiated Contact with Patient 06/21/15 1705     Chief Complaint  Patient presents with  . Chest Pain     (Consider location/radiation/quality/duration/timing/severity/associated sxs/prior Treatment) Patient is a 34 y.o. female presenting with chest pain. The history is provided by the patient.  Chest Pain She comes in with bilateral chest pain which is been present off and on for about the last month. Pain is dull and achy and worse with deep breathing and movement. There is no fever chills. She denies any cough. She feels slightly dyspneic when pain is present. She is also having generalized body aches. There's been no nausea or vomiting. She had been seen at emergency at [facility] and initially given a cough medicine which did not help. She went back and was given a short course of prednisone which did give her some relief, but symptoms recurred within a few days of stopping. Also, she has been seen for her eye doctor recently. She does have history of sarcoidosis and had some inflammation in her right eye for which she was put on steroid eyedrops and down now has developed inflammation in her left eye for which she is being placed on steroid eyedrops. In the past, she has been on methotrexate and Remicade for her sarcoidosis.  Past Medical History  Diagnosis Date  . Sarcoidosis ([place])     2009   Past Surgical History  Procedure Laterality Date  . Mass removed  2002  . Tonsillectomy    . Wisdom tooth extraction  2006  . Colonoscopy  2011    [facility]  . Breast biopsy Right neg    2002 Dr. Larinda Buttery   . Upper gi endoscopy  2007  . Dilation and curettage of uterus  2007  . Breast biopsy Bilateral 03/06/2015    Procedure: BILATERAL BREAST MASS EXCISION, right breast mass excision with ultrasound guided needle localization ;  Surgeon: Christene Lye, MD;  Location: [facility] ORS;   Service: General;  Laterality: Bilateral;   Family History  Problem Relation Age of Onset  . Cancer Maternal Aunt     Breast   . Cancer Maternal Grandfather     lung  . Cancer Maternal Aunt     Breast    Social History  Substance Use Topics  . Smoking status: Never Smoker   . Smokeless tobacco: Never Used  . Alcohol Use: 0.0 oz/week    0 Standard drinks or equivalent per week   OB History    Gravida Para Term Preterm AB TAB SAB Ectopic Multiple Living   1         0      Obstetric Comments   1st Menstrual Cycle:  12 1st Pregnancy:  18      Review of Systems  Cardiovascular: Positive for chest pain.  All other systems reviewed and are negative.     Allergies  Review of patient's allergies indicates no known allergies.  Home Medications   Prior to Admission medications   Medication Sig Start Date End Date Taking? Authorizing Provider  albuterol (PROVENTIL HFA;VENTOLIN HFA) 108 (90 BASE) MCG/ACT inhaler Inhale 2 puffs into the lungs every 4 (four) hours as needed for wheezing or shortness of breath. 06/04/15   Victorino Dike, FNP  azithromycin (ZITHROMAX) 250 MG tablet 2 tablets today, then 1 tablet for the next 4 days. 06/04/15   Cari B  Triplett, FNP  guaiFENesin-codeine 100-10 MG/5ML syrup Take 10 mLs by mouth 3 (three) times daily as needed. 06/04/15   Victorino Dike, FNP  predniSONE (DELTASONE) 10 MG tablet Take 5 tablets (50 mg total) by mouth daily. 06/04/15   Victorino Dike, FNP   Pulse 93  Temp(Src) 98.6 F (37 C) (Oral)  Resp 16  Ht 5\' 8"  (1.727 m)  Wt 195 lb (88.451 kg)  BMI 29.66 kg/m2  SpO2 100%  LMP 05/10/2015 Physical Exam  Nursing note and vitals reviewed.  34 year old female, resting comfortably and in no acute distress. Vital signs are normal. Oxygen saturation is 100%, which is normal. Head is normocephalic and atraumatic. PERRLA, EOMI. Oropharynx is clear. There is erythema of the conjunctiva of the medial aspect of the left eye. Neck is  nontender and supple without adenopathy or JVD. Back is nontender and there is no CVA tenderness. Lungs are clear without rales, wheezes, or rhonchi. Chest is tender throughout the anterior chest wall. Heart has regular rate and rhythm without murmur. Abdomen is soft, flat, nontender without masses or hepatosplenomegaly and peristalsis is normoactive. Extremities have no cyanosis or edema, full range of motion is present. Skin is warm and dry without rash. Neurologic: Mental status is normal, cranial nerves are intact, there are no motor or sensory deficits.  ED Course  Procedures (including critical care time) Labs Review Results for orders placed or performed during the hospital encounter of [id number]  Basic metabolic panel  Result Value Ref Range   Sodium 136 135 - 145 mmol/L   Potassium 4.4 3.5 - 5.1 mmol/L   Chloride 103 101 - 111 mmol/L   CO2 26 22 - 32 mmol/L   Glucose, Bld 106 (H) 65 - 99 mg/dL   BUN 9 6 - 20 mg/dL   Creatinine, Ser 0.70 0.44 - 1.00 mg/dL   Calcium 9.3 8.9 - 10.3 mg/dL   GFR calc non Af Amer >60 >60 mL/min   GFR calc Af Amer >60 >60 mL/min   Anion gap 7 5 - 15  CBC  Result Value Ref Range   WBC 8.0 4.0 - 10.5 K/uL   RBC 4.78 3.87 - 5.11 MIL/uL   Hemoglobin 11.1 (L) 12.0 - 15.0 g/dL   HCT 35.7 (L) 36.0 - 46.0 %   MCV 74.7 (L) 78.0 - 100.0 fL   MCH 23.2 (L) 26.0 - 34.0 pg   MCHC 31.1 30.0 - 36.0 g/dL   RDW 16.1 (H) 11.5 - 15.5 %   Platelets 435 (H) 150 - 400 K/uL  I-stat troponin, ED (not at [facility])  Result Value Ref Range   Troponin i, poc 0.00 0.00 - 0.08 ng/mL   Comment 3            Imaging Review Dg Chest 2 View  06/21/2015  CLINICAL DATA:  Chest pain for 1 month.  Nonsmoker. EXAM: CHEST  2 VIEW COMPARISON:  Radiographs 05/24/2015 and 06/04/2015.  CT 08/22/2009. FINDINGS: The heart size and mediastinal contours are normal. The lungs are clear. There is no pleural effusion or pneumothorax. No acute osseous findings are identified. EKG snaps  overlie the upper chest bilaterally on the frontal examination. IMPRESSION: No active cardiopulmonary process. Electronically Signed   By: Richardean Sale M.D.   On: 06/21/2015 13:22   I have personally reviewed and evaluated these images and lab results as part of my medical decision-making.   EKG Interpretation   Date/Time:  Wednesday June 21 2015 13:01:53  EST Ventricular Rate:  96 PR Interval:  146 QRS Duration: 70 QT Interval:  336 QTC Calculation: 424 R Axis:   66 Text Interpretation:  Normal sinus rhythm with sinus arrhythmia  Nonspecific T wave abnormality Abnormal ECG When compared with ECG of  06/04/2015, No significant change was found Confirmed by [facility]  MD, Oluwaseun Cremer  ([id number]) on 06/21/2015 5:10:02 PM      MDM   Final diagnoses:  Chest wall pain  Sarcoidosis (HCC)  Conjunctivitis, left eye    Chest wall pain which seems to be part of her sarcoidosis. I reviewed her past records and at the previous ED visits, she was treated for upper respiratory infection. Which seemed to help was the prednisone. Given her history of requiring immune modulating agents for control of her sarcoid, I suspect she will need to go back on a immune modulating agent or else be on long-term steroids. Her workup today is benign. She is placed on a three-week steroid taper. Patient states that she had a rheumatologist but the rheumatologist is new. She is referred to a rheumatologist and also to all of our pulmonary. I suspect that until she is on an immune modulating agent, she will need to be on long-term steroids and this needs to be managed through an office setting and not through the emergency department. All this has been explained to the patient and she expresses understanding.    Delora Fuel, MD [id number]

## 2015-06-21 NOTE — Discharge Instructions (Signed)
Take the prednisone as directed. Make an appointment with the pulmonologist or the rheumatologist as soon as possible. I suspect you will need to be on a longer course of prednisone, or on another immune modulating drug.  Chest Wall Pain Chest wall pain is pain in or around the bones and muscles of your chest. Sometimes, an injury causes this pain. Sometimes, the cause may not be known. This pain may take several weeks or longer to get better. HOME CARE INSTRUCTIONS  Pay attention to any changes in your symptoms. Take these actions to help with your pain:   Rest as told by your health care provider.   Avoid activities that cause pain. These include any activities that use your chest muscles or your abdominal and side muscles to lift heavy items.   If directed, apply ice to the painful area:  Put ice in a plastic bag.  Place a towel between your skin and the bag.  Leave the ice on for 20 minutes, 2-3 times per day.  Take over-the-counter and prescription medicines only as told by your health care provider.  Do not use tobacco products, including cigarettes, chewing tobacco, and e-cigarettes. If you need help quitting, ask your health care provider.  Keep all follow-up visits as told by your health care provider. This is important. SEEK MEDICAL CARE IF:  You have a fever.  Your chest pain becomes worse.  You have new symptoms. SEEK IMMEDIATE MEDICAL CARE IF:  You have nausea or vomiting.  You feel sweaty or light-headed.  You have a cough with phlegm (sputum) or you cough up blood.  You develop shortness of breath.   This information is not intended to replace advice given to you by your health care provider. Make sure you discuss any questions you have with your health care provider.   Document Released: 07/01/2005 Document Revised: 03/22/2015 Document Reviewed: 09/26/2014 Elsevier Interactive Patient Education [employer].  Sarcoidosis Sarcoidosis is a disease  that causes inflammation in your organs and other areas of your body. The lungs are most often affected (pulmonary sarcoidosis). Sarcoidosis can also affect your lymph nodes, liver, eyes, skin, or any other body tissue. When you have sarcoidosis, small clumps of tissue (granulomas) form in the affected area of your body. Granulomas are made up of your body's defense (immune) cells. Inflammation results when your body reacts to a harmful substance. Normally, inflammation goes away after immune cells get rid of the harmful substance. In sarcoidosis, the immune cells form granulomas instead. CAUSES  The exact cause of sarcoidosis is not known. Something triggers the immune system to respond, such as dust, chemicals, bacteria, or a virus.  RISK FACTORS You may be at a greater risk for sarcoidosis if you:   Have a family history of the disease.  Are African American.  Are of Northern European ancestry.  Are 10-49 years old.  Are female. SIGNS AND SYMPTOMS  Many people with sarcoidosis have no symptoms. Others have very mild symptoms. Sarcoidosis most often affects the lungs. Symptoms include:  Chest pain.  Coughing.  Wheezing.  Shortness of breath. Other common symptoms include:   Night sweats.  Weight loss.  Fatigue.  Depression.  A sense of uneasiness. DIAGNOSIS  Sarcoidosis may be diagnosed by:   Medical history and physical exam.  Chest X-ray. This looks for granulomas in your lungs.  Lung function tests. These measure your breathing and look for problems related to sarcoidosis.  Examining a sample of tissue under a microscope (  biopsy). TREATMENT  Sarcoidosis usually clears up without treatment. You may take medicines to reduce inflammation or relieve symptoms. These may include:  Prednisone. This steroid reduces inflammation related to sarcoidosis.  Chloroquine or hydroxychloroquine. These are antimalarial medicines used to treat sarcoidosis that affects the skin  or brain.  Methotrexate, leflunomide, or azathioprine. These medicines affect the immune system and can help with sarcoidosis in the joints, eyes, skin, or lungs.  Inhalers. Inhaled medicines can help you breathe if sarcoidosis is affecting your lungs. HOME CARE INSTRUCTIONS  Do not use any tobacco products, including cigarettes, chewing tobacco, or electronic cigarettes. If you need help quitting, ask your health care provider.  Avoid secondhand smoke.  Avoid irritating dust and chemicals. Stay indoors on days when air quality is poor in your area.  Take medicines only as directed by your health care provider. SEEK MEDICAL CARE IF:  You have vision problems.  You have shortness of breath.  You have a dry, persistent cough.  You have an irregular heartbeat.  You have pain or ache in your joints, hands, or feet.  You have an unexplained rash. SEEK IMMEDIATE MEDICAL CARE IF:  You have chest pain.   This information is not intended to replace advice given to you by your health care provider. Make sure you discuss any questions you have with your health care provider.   Document Released: 05/01/2004 Document Revised: 07/22/2014 Document Reviewed: 10/27/2013 Elsevier Interactive Patient Education [employer].

## 2015-07-14 ENCOUNTER — Emergency Department
Admission: EM | Admit: 2015-07-14 | Discharge: 2015-07-14 | Disposition: A | Discharge status: Home / Self Care | Payer: PRIVATE HEALTH INSURANCE | Attending: Emergency Medicine | Admitting: Emergency Medicine

## 2015-07-14 ENCOUNTER — Emergency Department: Payer: PRIVATE HEALTH INSURANCE

## 2015-07-14 DIAGNOSIS — D869 Sarcoidosis, unspecified: Secondary | ICD-10-CM | POA: Diagnosis not present

## 2015-07-14 DIAGNOSIS — R0789 Other chest pain: Secondary | ICD-10-CM

## 2015-07-14 DIAGNOSIS — R079 Chest pain, unspecified: Secondary | ICD-10-CM | POA: Diagnosis present

## 2015-07-14 DIAGNOSIS — Z792 Long term (current) use of antibiotics: Secondary | ICD-10-CM | POA: Insufficient documentation

## 2015-07-14 LAB — CBC
HCT: 33.5 % — ABNORMAL LOW (ref 35.0–47.0)
HEMOGLOBIN: 10.7 g/dL — AB (ref 12.0–16.0)
MCH: 22.8 pg — AB (ref 26.0–34.0)
MCHC: 31.9 g/dL — ABNORMAL LOW (ref 32.0–36.0)
MCV: 71.5 fL — ABNORMAL LOW (ref 80.0–100.0)
PLATELETS: 420 10*3/uL (ref 150–440)
RBC: 4.68 MIL/uL (ref 3.80–5.20)
RDW: 17.3 % — ABNORMAL HIGH (ref 11.5–14.5)
WBC: 12.6 10*3/uL — AB (ref 3.6–11.0)

## 2015-07-14 LAB — BASIC METABOLIC PANEL
ANION GAP: 6 (ref 5–15)
BUN: 16 mg/dL (ref 6–20)
CHLORIDE: 105 mmol/L (ref 101–111)
CO2: 26 mmol/L (ref 22–32)
Calcium: 9.3 mg/dL (ref 8.9–10.3)
Creatinine, Ser: 0.58 mg/dL (ref 0.44–1.00)
GFR calc Af Amer: >60 mL/min (ref >60)
Glucose, Bld: 134 mg/dL — ABNORMAL HIGH (ref 65–99)
POTASSIUM: 4.1 mmol/L (ref 3.5–5.1)
SODIUM: 137 mmol/L (ref 135–145)

## 2015-07-14 LAB — TROPONIN I

## 2015-07-14 MED ORDER — PREDNISONE 20 MG PO TABS
60.0000 mg | ORAL_TABLET | Freq: Once | ORAL | Status: AC
  Administered 2015-07-14: 60 mg via ORAL
  Filled 2015-07-14: qty 3

## 2015-07-14 MED ORDER — RANITIDINE HCL 150 MG PO CAPS: 150.0000 mg | ORAL_CAPSULE | Freq: Two times a day (BID) | ORAL | Status: DC

## 2015-07-14 MED ORDER — PREDNISONE 10 MG PO TABS: ORAL_TABLET | ORAL | Status: DC

## 2015-07-14 NOTE — ED Notes (Signed)
Patient with CP and difficulty breathing. States she has sarcoidosis but can't get in to see her pulmonologist for a month. Has not had a flare since October but is here because she can't get in to her doctor. ALso states her back is hurting. No obvious difficulty breathing. EKG shows NSR with sinus arrhythmia at 94/bpm in triage.

## 2015-07-14 NOTE — Discharge Instructions (Signed)
Chest Wall Pain Chest wall pain is pain in or around the bones and muscles of your chest. Sometimes, an injury causes this pain. Sometimes, the cause may not be known. This pain may take several weeks or longer to get better. HOME CARE INSTRUCTIONS  Pay attention to any changes in your symptoms. Take these actions to help with your pain:   Rest as told by your health care provider.   Avoid activities that cause pain. These include any activities that use your chest muscles or your abdominal and side muscles to lift heavy items.   If directed, apply ice to the painful area:  Put ice in a plastic bag.  Place a towel between your skin and the bag.  Leave the ice on for 20 minutes, 2-3 times per day.  Take over-the-counter and prescription medicines only as told by your health care provider.  Do not use tobacco products, including cigarettes, chewing tobacco, and e-cigarettes. If you need help quitting, ask your health care provider.  Keep all follow-up visits as told by your health care provider. This is important. SEEK MEDICAL CARE IF:  You have a fever.  Your chest pain becomes worse.  You have new symptoms. SEEK IMMEDIATE MEDICAL CARE IF:  You have nausea or vomiting.  You feel sweaty or light-headed.  You have a cough with phlegm (sputum) or you cough up blood.  You develop shortness of breath.   This information is not intended to replace advice given to you by your health care provider. Make sure you discuss any questions you have with your health care provider.   Document Released: 07/01/2005 Document Revised: 03/22/2015 Document Reviewed: 09/26/2014 Elsevier Interactive Patient Education [employer].  Sarcoidosis Sarcoidosis is a disease that causes inflammation in your organs and other areas of your body. The lungs are most often affected (pulmonary sarcoidosis). Sarcoidosis can also affect your lymph nodes, liver, eyes, skin, or any other body  tissue. When you have sarcoidosis, small clumps of tissue (granulomas) form in the affected area of your body. Granulomas are made up of your body's defense (immune) cells. Inflammation results when your body reacts to a harmful substance. Normally, inflammation goes away after immune cells get rid of the harmful substance. In sarcoidosis, the immune cells form granulomas instead. CAUSES  The exact cause of sarcoidosis is not known. Something triggers the immune system to respond, such as dust, chemicals, bacteria, or a virus.  RISK FACTORS You may be at a greater risk for sarcoidosis if you:   Have a family history of the disease.  Are African American.  Are of Northern European ancestry.  Are 49-27 years old.  Are female. SIGNS AND SYMPTOMS  Many people with sarcoidosis have no symptoms. Others have very mild symptoms. Sarcoidosis most often affects the lungs. Symptoms include:  Chest pain.  Coughing.  Wheezing.  Shortness of breath. Other common symptoms include:   Night sweats.  Weight loss.  Fatigue.  Depression.  A sense of uneasiness. DIAGNOSIS  Sarcoidosis may be diagnosed by:   Medical history and physical exam.  Chest X-ray. This looks for granulomas in your lungs.  Lung function tests. These measure your breathing and look for problems related to sarcoidosis.  Examining a sample of tissue under a microscope (biopsy). TREATMENT  Sarcoidosis usually clears up without treatment. You may take medicines to reduce inflammation or relieve symptoms. These may include:  Prednisone. This steroid reduces inflammation related to sarcoidosis.  Chloroquine or hydroxychloroquine. These are antimalarial  medicines used to treat sarcoidosis that affects the skin or brain.  Methotrexate, leflunomide, or azathioprine. These medicines affect the immune system and can help with sarcoidosis in the joints, eyes, skin, or lungs.  Inhalers. Inhaled medicines can help you  breathe if sarcoidosis is affecting your lungs. HOME CARE INSTRUCTIONS  Do not use any tobacco products, including cigarettes, chewing tobacco, or electronic cigarettes. If you need help quitting, ask your health care provider.  Avoid secondhand smoke.  Avoid irritating dust and chemicals. Stay indoors on days when air quality is poor in your area.  Take medicines only as directed by your health care provider. SEEK MEDICAL CARE IF:  You have vision problems.  You have shortness of breath.  You have a dry, persistent cough.  You have an irregular heartbeat.  You have pain or ache in your joints, hands, or feet.  You have an unexplained rash. SEEK IMMEDIATE MEDICAL CARE IF:  You have chest pain.   This information is not intended to replace advice given to you by your health care provider. Make sure you discuss any questions you have with your health care provider.   Document Released: 05/01/2004 Document Revised: 07/22/2014 Document Reviewed: 10/27/2013 Elsevier Interactive Patient Education [employer].

## 2015-07-14 NOTE — ED Notes (Signed)
Report from Vanessa, RN

## 2015-07-14 NOTE — ED Provider Notes (Signed)
[facility] Emergency Department Provider Note  ____________________________________________  Time seen: 7:15 AM  I have reviewed the triage vital signs and the nursing notes.   HISTORY  Chief Complaint Chest Pain and Sarcoidosis    HPI Marie Moran is a 34 y.o. female who reports that she has chronic sarcoidosis requiring immunomodulators including Remicade and methotrexate. She's not been back to see her rheumatologist and pulmonologist in some time. Since the unexpected death of her father-in-law in May 06, 2023, she has been under increased stress has been having worsening symptoms of her sarcoidosis requiring multiple courses of steroids. She most recently was at [facility] emergency department on December 7 where she was given a three-week steroid taper. She reports that this greatly improved her symptoms but the steroid taper finished 2 days ago, and now for the past 2 days her symptoms are worse again. She is having the same bilateral chest wall pain as before that is episodic lasting about 5 minutes at a time, bilateral anterior chest, nonradiating. No aggravating or alleviating factors. Feels like tightness. No associated shortness of breath vomiting or diaphoresis. She's continued to work out in the gym regularly as per her routine without symptoms.     Past Medical History  Diagnosis Date  . Sarcoidosis ([place])     2009     There are no active problems to display for this patient.    Past Surgical History  Procedure Laterality Date  . Mass removed  2002  . Tonsillectomy    . Wisdom tooth extraction  2006  . Colonoscopy  2011    [facility]  . Breast biopsy Right neg    2002 Dr. Larinda Buttery   . Upper gi endoscopy  2007  . Dilation and curettage of uterus  2007  . Breast biopsy Bilateral 03/06/2015    Procedure: BILATERAL BREAST MASS EXCISION, right breast mass excision with ultrasound guided needle localization ;  Surgeon: Christene Lye, MD;  Location: [facility] ORS;  Service: General;  Laterality: Bilateral;     Current Outpatient Rx  Name  Route  Sig  Dispense  Refill  . albuterol (PROVENTIL HFA;VENTOLIN HFA) 108 (90 BASE) MCG/ACT inhaler   Inhalation   Inhale 2 puffs into the lungs every 4 (four) hours as needed for wheezing or shortness of breath.   1 Inhaler   2   . azithromycin (ZITHROMAX) 250 MG tablet      2 tablets today, then 1 tablet for the next 4 days.   6 each   0   . guaiFENesin-codeine 100-10 MG/5ML syrup   Oral   Take 10 mLs by mouth 3 (three) times daily as needed.   120 mL   0   . predniSONE (DELTASONE) 10 MG tablet      Take five tablets a day (50 mg) for 3 days, Then four tablets a day (40 mg) for 3 days Then three tablets a day (30 mg) for 3 days Then two tablets a day (20 mg) for 5 days Then one tablet a day (10 mg) for 5 days   51 tablet   0   . ranitidine (ZANTAC) 150 MG capsule   Oral   Take 1 capsule (150 mg total) by mouth 2 (two) times daily.   60 capsule   0      Allergies Review of patient's allergies indicates no known allergies.   Family History  Problem Relation Age of Onset  . Cancer Maternal Aunt  Breast   . Cancer Maternal Grandfather     lung  . Cancer Maternal Aunt     Breast     Social History Social History  Substance Use Topics  . Smoking status: Never Smoker   . Smokeless tobacco: Never Used  . Alcohol Use: 0.0 oz/week    0 Standard drinks or equivalent per week     Comment: Occasionally    Review of Systems  Constitutional:   No fever or chills. No weight changes Eyes:   No blurry vision or double vision.  ENT:   No sore throat. Cardiovascular:   Positive chest pain. Respiratory:   No dyspnea positive nonproductive cough. Gastrointestinal:   Negative for abdominal pain, vomiting and diarrhea.  No BRBPR or melena. Genitourinary:   Negative for dysuria, urinary retention, bloody urine, or difficulty urinating. Musculoskeletal:    Negative for back pain. No joint swelling or pain. Skin:   Negative for rash. Neurological:   Negative for headaches, focal weakness or numbness. Psychiatric:  No anxiety or depression.   Endocrine:  No hot/cold intolerance, changes in energy, or sleep difficulty.  10-point ROS otherwise negative.  ____________________________________________   PHYSICAL EXAM:  VITAL SIGNS: ED Triage Vitals  Enc Vitals Group     BP 07/14/15 0509 163/92 mmHg     Pulse Rate 07/14/15 0509 90     Resp 07/14/15 0509 22     Temp 07/14/15 0509 98.9 F (37.2 C)     Temp Source 07/14/15 0509 Oral     SpO2 07/14/15 0509 100 %     Weight 07/14/15 0509 185 lb (83.915 kg)     Height 07/14/15 0509 5\' 8"  (1.727 m)     Head Cir --      Peak Flow --      Pain Score --      Pain Loc --      Pain Edu? --      Excl. in [place]? --     Vital signs reviewed, nursing assessments reviewed.   Constitutional:   Alert and oriented. Well appearing and in no distress. Eyes:   No scleral icterus. No conjunctival pallor. PERRL. EOMI ENT   Head:   Normocephalic and atraumatic.   Nose:   No congestion/rhinnorhea. No septal hematoma   Mouth/Throat:   MMM, no pharyngeal erythema. No peritonsillar mass. No uvula shift.   Neck:   No stridor. No SubQ emphysema. No meningismus. Hematological/Lymphatic/Immunilogical:   No cervical lymphadenopathy. Cardiovascular:   RRR. Normal and symmetric distal pulses are present in all extremities. No murmurs, rubs, or gallops. Respiratory:   Normal respiratory effort without tachypnea nor retractions. Breath sounds are clear and equal bilaterally. No wheezes/rales/rhonchi. Gastrointestinal:   Soft and nontender. No distention. There is no CVA tenderness.  No rebound, rigidity, or guarding. Genitourinary:   deferred Musculoskeletal:   Nontender with normal range of motion in all extremities. No joint effusions.  No lower extremity tenderness.  No edema. Positive chest wall  tenderness over the sternum Neurologic:   Normal speech and language.  CN 2-10 normal. Motor grossly intact. No pronator drift.  Normal gait. No gross focal neurologic deficits are appreciated.  Skin:    Skin is warm, dry and intact. No rash noted.  No petechiae, purpura, or bullae. Psychiatric:   Mood and affect are normal. Speech and behavior are normal. Patient exhibits appropriate insight and judgment.  ____________________________________________    LABS (pertinent positives/negatives) (all labs ordered are listed, but only abnormal results  are displayed) Labs Reviewed  CBC - Abnormal; Notable for the following:    WBC 12.6 (*)    Hemoglobin 10.7 (*)    HCT 33.5 (*)    MCV 71.5 (*)    MCH 22.8 (*)    MCHC 31.9 (*)    RDW 17.3 (*)    All other components within normal limits  BASIC METABOLIC PANEL - Abnormal; Notable for the following:    Glucose, Bld 134 (*)    All other components within normal limits  TROPONIN I   ____________________________________________   EKG  Interpreted by me  Date: 07/14/2015  Rate: 94  Rhythm: normal sinus rhythm  QRS Axis: normal  Intervals: normal  ST/T Wave abnormalities: normal  Conduction Disutrbances: none  Narrative Interpretation: unremarkable      ____________________________________________    RADIOLOGY  Chest x-ray unremarkable  ____________________________________________   PROCEDURES   ____________________________________________   INITIAL IMPRESSION / ASSESSMENT AND PLAN / ED COURSE  Pertinent labs & imaging results that were available during my care of the patient were reviewed by me and considered in my medical decision making (see chart for details).  Patient presents with recurrent chest wall pain that is acute on chronic. Low suspicion for ACS PE TAD pneumothorax carditis mediastinitis pneumonia or sepsis. Patient is very well-appearing, comfortable with unremarkable vital signs and EKG. Labs are  reassuring as is the chest x-ray. Exam reveals no other abnormalities. The symptoms have been greatly alleviated by a steroid course in the past, and it appears that she needs to resume her steroids at this time. As noted by Dr. Roxanne Mins at [facility], she is likely to require steroids until she can get back on immunomodulators therapy due to the severity of her chronic illness. She has a friend who works at the rheumatology clinic at [facility], and although they are fully booked for routine appointments until April, she is hopeful that she can get a work in appointment through a cancellation sometime soon. We'll restart a 21 day steroid taper. Because she also takes ibuprofen turmeric and vitamin D, I'll start her on ranitidine as well to protect from erosive gastritis.     ____________________________________________   FINAL CLINICAL IMPRESSION(S) / ED DIAGNOSES  Final diagnoses:  Chest wall pain  Sarcoidosis [facility])      Carrie Mew, MD 07/14/15 [phone]

## 2015-07-14 NOTE — ED Notes (Signed)
Pt alert and oriented X4, active, cooperative, pt in NAD. RR even and unlabored, color WNL.  Pt informed to return if any life threatening symptoms occur.   

## 2015-07-14 NOTE — ED Notes (Signed)
MD at bedside. 

## 2015-08-11 ENCOUNTER — Ambulatory Visit (INDEPENDENT_AMBULATORY_CARE_PROVIDER_SITE_OTHER): Payer: PRIVATE HEALTH INSURANCE | Admitting: Internal Medicine

## 2015-08-11 ENCOUNTER — Encounter: Payer: Self-pay | Admitting: Internal Medicine

## 2015-08-11 ENCOUNTER — Telehealth: Payer: Self-pay | Admitting: Internal Medicine

## 2015-08-11 VITALS — BP 126/74 | HR 80 | Ht 68.0 in | Wt 230.0 lb

## 2015-08-11 DIAGNOSIS — D869 Sarcoidosis, unspecified: Secondary | ICD-10-CM | POA: Insufficient documentation

## 2015-08-11 DIAGNOSIS — R0600 Dyspnea, unspecified: Secondary | ICD-10-CM

## 2015-08-11 LAB — PULMONARY FUNCTION TEST
FEF 25-75 Pre: 1.29 L/sec
FEF2575-%PRED-PRE: 39 %
FEV1-%PRED-PRE: 50 %
FEV1-PRE: 1.52 L
FEV1FVC-%Pred-Pre: 86 %
FEV6-%PRED-PRE: 58 %
FEV6-PRE: 2.09 L
FEV6FVC-%Pred-Pre: 101 %
FVC-%PRED-PRE: 58 %
FVC-PRE: 2.09 L
PRE FEV6/FVC RATIO: 100 %
Pre FEV1/FVC ratio: 73 %

## 2015-08-11 MED ORDER — TRAMADOL HCL 50 MG PO TABS: ORAL_TABLET | ORAL | Status: DC

## 2015-08-11 MED ORDER — PANTOPRAZOLE SODIUM 40 MG PO TBEC: 40.0000 mg | DELAYED_RELEASE_TABLET | Freq: Every day | ORAL | Status: DC

## 2015-08-11 MED ORDER — FAMOTIDINE 20 MG PO TABS: ORAL_TABLET | ORAL | Status: DC

## 2015-08-11 NOTE — Patient Instructions (Addendum)
Pantoprazole (protonix) 40 mg   Take  30-60 min before first meal of the day and Pepcid (famotidine)  20 mg one @  bedtime until return to office - this is the best way to tell whether stomach acid is contributing to your problem.   GERD (REFLUX)  is an extremely common cause of respiratory symptoms just like yours , many times with no obvious heartburn at all.    It can be treated with medication, but also with lifestyle changes including elevation of the head of your bed (ideally with 6 inch  bed blocks),  Smoking cessation, avoidance of late meals, excessive alcohol, and avoid fatty foods, chocolate, peppermint, colas, red wine, and acidic juices such as orange juice.  NO MINT OR MENTHOL PRODUCTS SO NO COUGH DROPS  USE SUGARLESS CANDY INSTEAD (Jolley ranchers or Stover's or Life Savers) or even ice chips will also do - the key is to swallow to prevent all throat clearing. NO OIL BASED VITAMINS - use powdered substitutes.    Take delsym two tsp every 12 hours and supplement if needed with  tramadol 50 mg up to 2 every 4 hours to suppress the urge to cough. Swallowing water or using ice chips/non mint and menthol containing candies (such as lifesavers or sugarless jolly ranchers) are also effective.  You should rest your voice and avoid activities that you know make you cough.  Once you have eliminated the cough for 3 straight days try reducing the tramadol first,  then the delsym as tolerated.    See your ENT doctor as soon as you can and take the flow volume loop with you and call us with his name

## 2015-08-11 NOTE — Telephone Encounter (Signed)
Pt states ENT is Dr. Pryor Ochoa at [facility]   Pantoprazole (protonix) 40 mg Take 30-60 min before first meal of the day and Pepcid (famotidine) 20 mg one @ bedtime until return to office - this is the best way to tell whether stomach acid is contributing to your problem.   GERD (REFLUX) is an extremely common cause of respiratory symptoms just like yours , many times with no obvious heartburn at all.   It can be treated with medication, but also with lifestyle changes including elevation of the head of your bed (ideally with 6 inch bed blocks), Smoking cessation, avoidance of late meals, excessive alcohol, and avoid fatty foods, chocolate, peppermint, colas, red wine, and acidic juices such as orange juice.  NO MINT OR MENTHOL PRODUCTS SO NO COUGH DROPS  USE SUGARLESS CANDY INSTEAD (Jolley ranchers or Stover's or Life Savers) or even ice chips will also do - the key is to swallow to prevent all throat clearing. NO OIL BASED VITAMINS - use powdered substitutes.   Take delsym two tsp every 12 hours and supplement if needed with tramadol 50 mg up to 2 every 4 hours to suppress the urge to cough. Swallowing water or using ice chips/non mint and menthol containing candies (such as lifesavers or sugarless jolly ranchers) are also effective. You should rest your voice and avoid activities that you know make you cough.  Once you have eliminated the cough for 3 straight days try reducing the tramadol first, then the delsym as tolerated.   See your ENT doctor as soon as you can and take the flow volume loop with you and call us with his name

## 2015-08-11 NOTE — Progress Notes (Signed)
Subjective:     Patient ID: Marie Moran, female   DOB: April 12, 1981      MRN: [id number]  HPI   10 yobm never smoker with dx of sarcoid 2011 presented as uveitis/ night sweats/joint aches some better prednisone only high doses then added mtx /remicade and improved and first stopped pred then remicade then mtx and on no rx for sev years  Thru most of summer 2016 then August 2016 started having viz problems dx as uveitis rx eyedrops > then sob at gym in Oct 2016 > Multiple ER trips to [facility] dx ? Bronchitis > trasiently some better on prednisone referred to pulmonary clinic 08/11/2015 by Dr Orland Mustard    08/11/2015 1st [facility] office visit/ Marie Moran   Chief Complaint  Patient presents with  . Pulmonary Consult    Referred by Dr. Orland Mustard. Pt states that she was dxed with Sarcoid 9 years ago "by a blood test".  She c/o CP, cough and SOB "all the time" for the past 3 months. She sates that CP is sharp and constant- in the center of her chest and below breasts. She has also had some vision changes- seeing [place] 24/7 worse with any activity/ worse supine Cp midline worse deep breath/  Assoc dry cough / no resp to saba  Cough assoc with midline cp no n  Or v or Diaphoresis  No obvious day to day or daytime variability or assoc   chest tightness, subjective wheeze or overt sinus or hb symptoms. No unusual exp hx or h/o childhood pna/ asthma or knowledge of premature birth.  Sleeping ok without nocturnal  or early am exacerbation  of respiratory  c/o's or need for noct saba. Also denies any obvious fluctuation of symptoms with weather or environmental changes or other aggravating or alleviating factors except as outlined above   Current Medications, Allergies, Complete Past Medical History, Past Surgical History, Family History, and Social History were reviewed in [employer] record.  ROS  The following are not active complaints  unless bolded sore throat, dysphagia, dental problems, itching, sneezing,  nasal congestion or excess/ purulent secretions, ear ache,   fever, chills, sweats, unintended wt loss, classically pleuritic or exertional cp, hemoptysis,  orthopnea pnd or leg swelling, presyncope, palpitations, abdominal pain, anorexia, nausea, vomiting, diarrhea  or change in bowel or bladder habits, change in stools or urine, dysuria,hematuria,  rash, arthralgias, visual complaints, headache, numbness, weakness or ataxia or problems with walking or coordination,  change in mood/affect or memory.       Review of Systems     Objective:   Physical Exam    amb bf with mild voice fatigue and  wheezing heard across the room insp and exp   Wt Readings from Last 3 Encounters:  08/11/15 230 lb (104.327 kg)  07/14/15 185 lb (83.915 kg)  06/21/15 195 lb (88.451 kg)    Vital signs reviewed   HEENT: nl dentition, turbinates, and oropharynx. Nl external ear canals without cough reflex   NECK :  without JVD/Nodes/TM/ nl carotid upstrokes bilaterally   LUNGS: no acc muscle use,  Nl contour chest which is clear to A and P bilaterally without cough on insp or exp maneuvers   CV:  RRR  no s3 or murmur or increase in P2, no edema   ABD:  soft and nontender with nl inspiratory excursion in the supine position. No bruits or organomegaly, bowel sounds nl  MS:  Nl gait/ ext warm without deformities, calf tenderness, cyanosis or clubbing No obvious joint restrictions   SKIN: warm and dry without lesions    NEURO:  alert, approp, nl sensorium with  no motor deficits    I personally reviewed images and agree with radiology impression as follows:  CXR:  07/14/15 No acute cardiopulmonary process seen. Upper portion of trachea looks fine/ no obvious deviation/compression        Assessment:

## 2015-08-11 NOTE — Progress Notes (Signed)
Spirometry done today. 

## 2015-08-11 NOTE — Assessment & Plan Note (Addendum)
Original dx 2011 rx pred/ mtx/ remicade x sev years then in remission until Aug 2016 with ? Ocular involvement  - no evidence of pulmonary sarcoid 08/11/2015 A good rule of thumb is that >95% of pts with active sarcoid in any organ will have some plain cxr changes - on the other hand  if there are active pulmonary symptoms the cxr will look much worse than the patient:  No evidence of either scenario here/ strongly doubt active dz    F/u opthamology planned ? Need rheum input again if topical rx not adequate as there is not pulmoary indication for pred rx

## 2015-08-11 NOTE — Assessment & Plan Note (Addendum)
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   These are the same pts (now being labeled as having "irritable larynx syndrome" by some cough centers) who not infrequently have a history of having failed to tolerate ace inhibitors,  dry powder inhalers or biphosphonates or report having atypical reflux symptoms that don't respond to standard doses of PPI , and are easily confused as having aecopd or asthma flares by even experienced allergists/ pulmonologists.    Try max rx for GERD/ cyclical cough pending ENT eval in [place] since she is already established there.   Total time devoted to counseling  = 35/36m review case with pt/ discussion of options/alternatives/ personally creating in presence of pt  then going over specific  Instructions directly with the pt including how to use all of the meds but in particular covering each new medication in detail (see avs)

## 2015-08-13 ENCOUNTER — Encounter: Payer: Self-pay | Admitting: Emergency Medicine

## 2015-08-13 ENCOUNTER — Emergency Department: Payer: PRIVATE HEALTH INSURANCE

## 2015-08-13 ENCOUNTER — Emergency Department
Admission: EM | Admit: 2015-08-13 | Discharge: 2015-08-13 | Disposition: A | Discharge status: Home / Self Care | Payer: PRIVATE HEALTH INSURANCE | Attending: Emergency Medicine | Admitting: Emergency Medicine

## 2015-08-13 DIAGNOSIS — R0600 Dyspnea, unspecified: Secondary | ICD-10-CM | POA: Insufficient documentation

## 2015-08-13 DIAGNOSIS — R079 Chest pain, unspecified: Secondary | ICD-10-CM

## 2015-08-13 DIAGNOSIS — Z7952 Long term (current) use of systemic steroids: Secondary | ICD-10-CM | POA: Insufficient documentation

## 2015-08-13 DIAGNOSIS — R062 Wheezing: Secondary | ICD-10-CM | POA: Insufficient documentation

## 2015-08-13 DIAGNOSIS — R0602 Shortness of breath: Secondary | ICD-10-CM | POA: Diagnosis not present

## 2015-08-13 DIAGNOSIS — Z79899 Other long term (current) drug therapy: Secondary | ICD-10-CM | POA: Diagnosis not present

## 2015-08-13 LAB — CBC
HEMATOCRIT: 33.3 % — AB (ref 35.0–47.0)
HEMOGLOBIN: 10.5 g/dL — AB (ref 12.0–16.0)
MCH: 22.3 pg — AB (ref 26.0–34.0)
MCHC: 31.4 g/dL — AB (ref 32.0–36.0)
MCV: 71 fL — ABNORMAL LOW (ref 80.0–100.0)
Platelets: 457 10*3/uL — ABNORMAL HIGH (ref 150–440)
RBC: 4.69 MIL/uL (ref 3.80–5.20)
RDW: 17.5 % — ABNORMAL HIGH (ref 11.5–14.5)
WBC: 11.7 10*3/uL — ABNORMAL HIGH (ref 3.6–11.0)

## 2015-08-13 LAB — BASIC METABOLIC PANEL
ANION GAP: 6 (ref 5–15)
BUN: 11 mg/dL (ref 6–20)
CHLORIDE: 102 mmol/L (ref 101–111)
CO2: 30 mmol/L (ref 22–32)
Calcium: 8.9 mg/dL (ref 8.9–10.3)
Creatinine, Ser: 0.65 mg/dL (ref 0.44–1.00)
GFR calc Af Amer: >60 mL/min (ref >60)
GFR calc non Af Amer: >60 mL/min (ref >60)
GLUCOSE: 120 mg/dL — AB (ref 65–99)
POTASSIUM: 3.4 mmol/L — AB (ref 3.5–5.1)
Sodium: 138 mmol/L (ref 135–145)

## 2015-08-13 LAB — TROPONIN I: Troponin I: <0.03 ng/mL (ref <0.031)

## 2015-08-13 LAB — LIPASE, BLOOD: Lipase: 23 U/L (ref 11–51)

## 2015-08-13 MED ORDER — IBUPROFEN 200 MG PO TABS: 600.0000 mg | ORAL_TABLET | Freq: Three times a day (TID) | ORAL | Status: DC | PRN

## 2015-08-13 MED ORDER — PREDNISONE 10 MG PO TABS: ORAL_TABLET | ORAL | Status: DC

## 2015-08-13 MED ORDER — PREDNISONE 20 MG PO TABS
60.0000 mg | ORAL_TABLET | Freq: Once | ORAL | Status: AC
  Administered 2015-08-13: 60 mg via ORAL
  Filled 2015-08-13: qty 3

## 2015-08-13 NOTE — Telephone Encounter (Signed)
Aware, copy of note sent

## 2015-08-13 NOTE — ED Provider Notes (Addendum)
[facility] Emergency Department Provider Note  ____________________________________________  Time seen: Approximately 7:05 AM  I have reviewed the triage vital signs and the nursing notes.   HISTORY  Chief Complaint Chest Pain and Abdominal Pain    HPI Marie Moran is a 35 y.o. female with a history of sarcoidosis who is presenting today with chest pain and dyspnea. She says that the chest pain and dyspnea have been ongoing since November. She says that she is in constant pain from these issues however this morning the pain became acutely worse about 2 hours ago. She describes the pain as a 6 out of 10 at this time. She says that it is sharp into the central chest radiating under the left and right breast. She denies any nausea or vomiting. Says she does of shortness of breath with "wheezing." She was recently seen by a pulmonologist who prescribed her with tramadol as well as pantoprazole. He felt that the pantoprazole may help the chest pain as well as the difficulty breathing as the difficulty breathing may be related to upper airway narrowing presumably from reflux. The patient has not had any relief with these medications. She has been on multiple steroid tapers since November which she says do believe the symptoms. She was previously taking Remicade and methotrexate but says that she was having reactions to both of these medications and has since been off them. At that time she was also being seen by rheumatologist who she has been trying to get an appointment with. However, she says the nearest appointment is in March or April. She says the pain is worsened with movement such as moving her arms as well as rolling over in bed. However, she says it is also worsened with exertion as well as the shortness of breath.Denies any history of smoking, drug use or heavy drinking. No history of cardiac disease in her family.   Past Medical History  Diagnosis Date  .  Sarcoidosis ([place])     2009    Patient Active Problem List   Diagnosis Date Noted  . Dyspnea 08/11/2015  . Sarcoidosis ([place]) 08/11/2015    Past Surgical History  Procedure Laterality Date  . Mass removed  2002  . Tonsillectomy    . Wisdom tooth extraction  2006  . Colonoscopy  2011    [facility]  . Breast biopsy Right neg    2002 Dr. Larinda Buttery   . Upper gi endoscopy  2007  . Dilation and curettage of uterus  2007  . Breast biopsy Bilateral 03/06/2015    Procedure: BILATERAL BREAST MASS EXCISION, right breast mass excision with ultrasound guided needle localization ;  Surgeon: Christene Lye, MD;  Location: [facility] ORS;  Service: General;  Laterality: Bilateral;    Current Outpatient Rx  Name  Route  Sig  Dispense  Refill  . albuterol (PROVENTIL HFA;VENTOLIN HFA) 108 (90 BASE) MCG/ACT inhaler   Inhalation   Inhale 2 puffs into the lungs every 4 (four) hours as needed for wheezing or shortness of breath.   1 Inhaler   2   . Difluprednate (DUREZOL) 0.05 % EMUL   Ophthalmic   Apply 4 drops to eye 4 (four) times daily.         . famotidine (PEPCID) 20 MG tablet      One at bedtime   30 tablet   2   . ibuprofen (ADVIL,MOTRIN) 200 MG tablet   Oral   Take 200 mg by mouth every  6 (six) hours as needed.         . pantoprazole (PROTONIX) 40 MG tablet   Oral   Take 1 tablet (40 mg total) by mouth daily. Take 30-60 min before first meal of the day   30 tablet   2   . predniSONE (DELTASONE) 10 MG tablet      Take five tablets a day (50 mg) for 3 days, Then four tablets a day (40 mg) for 3 days Then three tablets a day (30 mg) for 3 days Then two tablets a day (20 mg) for 5 days Then one tablet a day (10 mg) for 5 days   51 tablet   0   . traMADol (ULTRAM) 50 MG tablet      1-2 every 4 hours as needed for cough or pain   40 tablet   0     Allergies Review of patient's allergies indicates no known allergies.  Family History  Problem Relation Age of  Onset  . Cancer Maternal Aunt     Breast   . Cancer Maternal Grandfather     lung  . Cancer Maternal Aunt     Breast     Social History Social History  Substance Use Topics  . Smoking status: Never Smoker   . Smokeless tobacco: Never Used  . Alcohol Use: 0.0 oz/week    0 Standard drinks or equivalent per week     Comment: Occasionally    Review of Systems Constitutional: No fever/chills Eyes: No visual changes. ENT: No sore throat. Cardiovascular: As above. Respiratory: Denies shortness of breath. Gastrointestinal: No abdominal pain.  No nausea, no vomiting.  No diarrhea.  No constipation. Genitourinary: Negative for dysuria. Musculoskeletal: Negative for back pain. Skin: Negative for rash. Neurological: Negative for headaches, focal weakness or numbness.  10-point ROS otherwise negative.  ____________________________________________   PHYSICAL EXAM:  VITAL SIGNS: ED Triage Vitals  Enc Vitals Group     BP 08/13/15 0628 161/96 mmHg     Pulse Rate 08/13/15 0628 88     Resp 08/13/15 0628 20     Temp 08/13/15 0628 98.3 F (36.8 C)     Temp Source 08/13/15 0628 Oral     SpO2 08/13/15 0628 100 %     Weight 08/13/15 0628 220 lb (99.791 kg)     Height 08/13/15 0628 5\' 8"  (1.727 m)     Head Cir --      Peak Flow --      Pain Score 08/13/15 0624 8     Pain Loc --      Pain Edu? --      Excl. in [place]? --     Constitutional: Alert and oriented. Well appearing and in no acute distress. Audible stridor. However controlling her secretions and speaks in full sentences without any respiratory distress. Eyes: Conjunctivae are normal. PERRL. EOMI. Head: Atraumatic. Nose: No congestion/rhinnorhea. Mouth/Throat: Mucous membranes are moist.  Oropharynx non-erythematous. Neck: Mild inspiratory stridor.   Cardiovascular: Normal rate, regular rhythm. Grossly normal heart sounds.  Good peripheral circulation. Chest pain is reproducible palpation. Respiratory: Normal respiratory  effort.  No retractions. Lungs CTAB. Gastrointestinal: Soft and nontender. No distention. No abdominal bruits. No CVA tenderness. Musculoskeletal: No lower extremity tenderness nor edema.  No joint effusions. Neurologic:  Normal speech and language. No gross focal neurologic deficits are appreciated. No gait instability. Skin:  Skin is warm, dry and intact. No rash noted. Psychiatric: Mood and affect are normal. Speech and behavior  are normal.  ____________________________________________   LABS (all labs ordered are listed, but only abnormal results are displayed)  Labs Reviewed  BASIC METABOLIC PANEL - Abnormal; Notable for the following:    Potassium 3.4 (*)    Glucose, Bld 120 (*)    All other components within normal limits  CBC - Abnormal; Notable for the following:    WBC 11.7 (*)    Hemoglobin 10.5 (*)    HCT 33.3 (*)    MCV 71.0 (*)    MCH 22.3 (*)    MCHC 31.4 (*)    RDW 17.5 (*)    Platelets 457 (*)    All other components within normal limits  LIPASE, BLOOD  TROPONIN I  URINALYSIS COMPLETEWITH MICROSCOPIC ([facility] ONLY)  PREGNANCY, URINE   ____________________________________________  EKG  ED ECG REPORT I, Doran Stabler, the attending physician, personally viewed and interpreted this ECG.   Date: 08/13/2015  EKG Time: 625  Rate: 85  Rhythm: normal EKG, normal sinus rhythm  Axis: Normal  Intervals:none  ST&T Change: No ST segment elevation or depression. No abnormal T-wave inversion.  ____________________________________________  RADIOLOGY  No active cardiopulmonary disease. ____________________________________________   PROCEDURES   ____________________________________________   INITIAL IMPRESSION / ASSESSMENT AND PLAN / ED COURSE  Pertinent labs & imaging results that were available during my care of the patient were reviewed by me and considered in my medical decision making (see chart for  details).  ----------------------------------------- 8:20 AM on 08/13/2015 -----------------------------------------  Patient says pain is now down to a 4 out of 10 after prednisone. Breathing improved. Still with a very mild amount of stridor but still without any respiratory distress as well. We will restart on prednisone. Unclear etiology. Agree with pulmonology that would be a strange presentation for sarcoid secondary to normal chest x-ray presentation. However, patient did have several years of improved symptoms on immunomodulators. Will follow-up with ENT. Says that she will make sure to schedule an point with her rheumatologist even if it is not until March or April decision may have it on the books. We'll discharge to home. Very low suspicion for aortic pathology, ACS or PE. Symptoms are chronic and appeared be exacerbation of her chronic symptoms. ____________________________________________   FINAL CLINICAL IMPRESSION(S) / ED DIAGNOSES  Chest pain. Dyspnea.    Orbie Pyo, MD 08/13/15 [phone]  Patient aware of negative side effects of steroids and prolonged steroid use. However, given symptoms of chest pain and especially shortness of breath I do not see an easy alternative option. The patient understands this.  Also, addition to physical exam above. The patient had bilateral and equal radial as well as dorsalis pedis pulses.  Orbie Pyo, MD 08/13/15 [phone]

## 2015-08-13 NOTE — ED Notes (Addendum)
Patient transported to x-ray. ?

## 2015-08-13 NOTE — ED Notes (Addendum)
Pt with history of Sarcoidosis; c/o chest pain to center of chest and across upper abdomen since November; this is the 5th times she's been to an ER since November-has not been admitted; says she was prescribed steroid and discharged home; saw her Pulmonologist on 08/11/15 and was put on new medication including Tramadol and protonix; medication not helping pain; not feeling any better; pt with shortness of breath on exertion; nonproductive cough; denies fever

## 2015-08-21 ENCOUNTER — Telehealth: Payer: Self-pay | Admitting: Internal Medicine

## 2015-08-21 NOTE — Telephone Encounter (Signed)
Information already faxed.Marie Moran

## 2015-08-21 NOTE — Telephone Encounter (Signed)
Pt states that nothing further is needed. Everything has been faxed and received by ENT. Will close message.

## 2015-09-19 ENCOUNTER — Other Ambulatory Visit: Payer: Self-pay | Admitting: Internal Medicine

## 2015-09-19 ENCOUNTER — Other Ambulatory Visit: Payer: Self-pay | Admitting: Rheumatology

## 2015-09-19 ENCOUNTER — Ambulatory Visit
Admission: RE | Admit: 2015-09-19 | Discharge: 2015-09-19 | Disposition: A | Discharge status: Home / Self Care | Payer: PRIVATE HEALTH INSURANCE | Source: Ambulatory Visit | Attending: Rheumatology | Admitting: Rheumatology

## 2015-09-19 DIAGNOSIS — R0602 Shortness of breath: Secondary | ICD-10-CM

## 2015-09-19 DIAGNOSIS — D869 Sarcoidosis, unspecified: Secondary | ICD-10-CM

## 2015-11-06 ENCOUNTER — Other Ambulatory Visit: Payer: Self-pay | Admitting: Internal Medicine

## 2015-11-21 ENCOUNTER — Other Ambulatory Visit: Payer: Self-pay | Admitting: Internal Medicine

## 2016-02-08 ENCOUNTER — Encounter: Payer: Self-pay | Admitting: *Deleted

## 2016-03-20 ENCOUNTER — Ambulatory Visit: Payer: PRIVATE HEALTH INSURANCE | Admitting: General Surgery

## 2016-03-21 ENCOUNTER — Emergency Department: Payer: PRIVATE HEALTH INSURANCE

## 2016-03-21 ENCOUNTER — Emergency Department
Admission: EM | Admit: 2016-03-21 | Discharge: 2016-03-21 | Disposition: A | Discharge status: Home / Self Care | Payer: PRIVATE HEALTH INSURANCE | Attending: Emergency Medicine | Admitting: Emergency Medicine

## 2016-03-21 DIAGNOSIS — R0602 Shortness of breath: Secondary | ICD-10-CM | POA: Diagnosis not present

## 2016-03-21 DIAGNOSIS — Z79899 Other long term (current) drug therapy: Secondary | ICD-10-CM | POA: Diagnosis not present

## 2016-03-21 LAB — COMPREHENSIVE METABOLIC PANEL
ALT: 18 U/L (ref 14–54)
ANION GAP: 6 (ref 5–15)
AST: 20 U/L (ref 15–41)
Albumin: 3.9 g/dL (ref 3.5–5.0)
Alkaline Phosphatase: 60 U/L (ref 38–126)
BUN: 14 mg/dL (ref 6–20)
CO2: 27 mmol/L (ref 22–32)
Calcium: 9.2 mg/dL (ref 8.9–10.3)
Chloride: 104 mmol/L (ref 101–111)
Creatinine, Ser: 0.66 mg/dL (ref 0.44–1.00)
Glucose, Bld: 100 mg/dL — ABNORMAL HIGH (ref 65–99)
POTASSIUM: 4.4 mmol/L (ref 3.5–5.1)
Sodium: 137 mmol/L (ref 135–145)
TOTAL PROTEIN: 8.1 g/dL (ref 6.5–8.1)

## 2016-03-21 LAB — POCT PREGNANCY, URINE: Preg Test, Ur: NEGATIVE

## 2016-03-21 LAB — CBC
HEMATOCRIT: 30.7 % — AB (ref 35.0–47.0)
Hemoglobin: 9.9 g/dL — ABNORMAL LOW (ref 12.0–16.0)
MCH: 21.6 pg — AB (ref 26.0–34.0)
MCHC: 32.2 g/dL (ref 32.0–36.0)
MCV: 66.9 fL — AB (ref 80.0–100.0)
Platelets: 485 10*3/uL — ABNORMAL HIGH (ref 150–440)
RBC: 4.59 MIL/uL (ref 3.80–5.20)
RDW: 19.4 % — AB (ref 11.5–14.5)
WBC: 9.4 10*3/uL (ref 3.6–11.0)

## 2016-03-21 LAB — FIBRIN DERIVATIVES D-DIMER (ARMC ONLY): FIBRIN DERIVATIVES D-DIMER (ARMC): 283 (ref 0–499)

## 2016-03-21 MED ORDER — IPRATROPIUM-ALBUTEROL 0.5-2.5 (3) MG/3ML IN SOLN
3.0000 mL | Freq: Once | RESPIRATORY_TRACT | Status: AC
  Administered 2016-03-21: 3 mL via RESPIRATORY_TRACT
  Filled 2016-03-21 (×2): qty 3

## 2016-03-21 MED ORDER — ALBUTEROL SULFATE HFA 108 (90 BASE) MCG/ACT IN AERS: INHALATION_SPRAY | RESPIRATORY_TRACT | 1 refills | Status: DC

## 2016-03-21 MED ORDER — PREDNISONE 20 MG PO TABS: 40.0000 mg | ORAL_TABLET | Freq: Every day | ORAL | 0 refills | Status: DC

## 2016-03-21 MED ORDER — ALBUTEROL SULFATE (2.5 MG/3ML) 0.083% IN NEBU
5.0000 mg | INHALATION_SOLUTION | Freq: Once | RESPIRATORY_TRACT | Status: AC
  Administered 2016-03-21: 5 mg via RESPIRATORY_TRACT
  Filled 2016-03-21: qty 6

## 2016-03-21 NOTE — ED Triage Notes (Signed)
Patient presents to the ED with complaint of increasing shortness of breath x 1 week.  Patient reports history of sarcoidosis.  Patient reports that she takes humera, methyltrexate, zoloft and prednisone and states that sometimes the medications interacting causes temporary shortness of breath but to feel this short of breath x 1 week is unusual.  Patient reports sob is worse at night.  Patient is audibly wheezing during triage and has a dry non-productive cough.

## 2016-03-21 NOTE — Discharge Instructions (Signed)
As we discussed, your evaluation was reassuring today.  We recommend that you use the albuterol inhaler as needed and follow-up with Dr. Melvyn Novas at the next available opportunity.  We refilled her prednisone since you have said you are almost out and you have been taking it for so long that we do not want there to be a time period where you are not on it - you need to see your pulmonologist at the next available opportunity to discuss all of your medications and any additional treatment that may help your sarcoidosis.    Return to the emergency department if you develop new or worsening symptoms that concern you.

## 2016-03-21 NOTE — ED Provider Notes (Signed)
[facility] Emergency Department Provider Note  ____________________________________________   First MD Initiated Contact with Patient 03/21/16 1927     (approximate)  I have reviewed the triage vital signs and the nursing notes.   HISTORY  Chief Complaint Shortness of Breath    HPI Marie Moran is a 35 y.o. female with a history of sarcoidosis treated by pulmonology (Dr. Christinia Gully in [place]) and rheumatology with prednisone, Humira, and methotrexate.  She presents for evaluation of worsening shortness of breath over the last week that has gotten severe today.  She states it is worse with exertion.  She can hear herself wheezing and she feels like she is not getting enough air.  She is not currently using any nebulizers or inhalers as she was taken off them previously when she started on the rheumatological treatments.  She had a DuoNeb in triage and stated that it does make her feel a little bit better.  She has not been in contact with her pulmonologist recently about these symptoms.  She denies fever/chills, chest pain, nausea, vomiting, abdominal pain, dysuria.   Past Medical History:  Diagnosis Date  . Sarcoidosis ([place])    2009    Patient Active Problem List   Diagnosis Date Noted  . Dyspnea 08/11/2015  . Sarcoidosis ([place]) 08/11/2015    Past Surgical History:  Procedure Laterality Date  . BREAST BIOPSY Right neg   2002 Dr. Larinda Buttery   . BREAST BIOPSY Bilateral 03/06/2015   Procedure: BILATERAL BREAST MASS EXCISION, right breast mass excision with ultrasound guided needle localization ;  Surgeon: Christene Lye, MD;  Location: [facility] ORS;  Service: General;  Laterality: Bilateral;  . COLONOSCOPY  2011   [place]  2007  . mass removed  2002  . TONSILLECTOMY    . UPPER GI ENDOSCOPY  2007  . WISDOM TOOTH EXTRACTION  2006    Prior to Admission medications   Medication Sig Start Date End  Date Taking? Authorizing Provider  albuterol (PROVENTIL HFA;VENTOLIN HFA) 108 (90 Base) MCG/ACT inhaler Inhale 4-6 puffs by mouth every 4 hours as needed for wheezing, cough, and/or shortness of breath 03/21/16   Hinda Kehr, MD  Difluprednate (DUREZOL) 0.05 % EMUL Apply 4 drops to eye 4 (four) times daily.    Historical Provider, MD  famotidine (PEPCID) 20 MG tablet One at bedtime 08/11/15   Tanda Rockers, MD  ibuprofen ([employer] IBUPROFEN) 200 MG tablet Take 3 tablets (600 mg total) by mouth every 8 (eight) hours as needed for mild pain or moderate pain. 08/13/15   Orbie Pyo, MD  pantoprazole (PROTONIX) 40 MG tablet Take 1 tablet (40 mg total) by mouth daily. Take 30-60 min before first meal of the day 08/11/15   Tanda Rockers, MD  predniSONE (DELTASONE) 20 MG tablet Take 2 tablets (40 mg total) by mouth daily. 03/21/16   Hinda Kehr, MD  traMADol (ULTRAM) 50 MG tablet TAKE ONE TO TWO TABLETS BY MOUTH EVERY 4 HOURS AS NEEDED FOR PAIN OR  COUGH 09/19/15   Tanda Rockers, MD    Allergies Review of patient's allergies indicates no known allergies.  Family History  Problem Relation Age of Onset  . Cancer Maternal Aunt     Breast   . Cancer Maternal Grandfather     lung  . Cancer Maternal Aunt     Breast     Social History Social History  Substance Use Topics  .  Smoking status: Never Smoker  . Smokeless tobacco: Never Used  . Alcohol use 0.0 oz/week     Comment: Occasionally    Review of Systems Constitutional: No fever/chills Eyes: No visual changes. ENT: No sore throat. Cardiovascular: Denies chest pain. Respiratory: +shortness of breath And wheezing Gastrointestinal: No abdominal pain.  No nausea, no vomiting.  No diarrhea.  No constipation. Genitourinary: Negative for dysuria. Musculoskeletal: Negative for back pain. Skin: Negative for rash. Neurological: Negative for headaches, focal weakness or numbness.  10-point ROS otherwise  negative.  ____________________________________________   PHYSICAL EXAM:  VITAL SIGNS: ED Triage Vitals  Enc Vitals Group     BP 03/21/16 1533 (!) 150/84     Pulse Rate 03/21/16 1533 91     Resp 03/21/16 1533 (!) 22     Temp 03/21/16 1533 98.9 F (37.2 C)     Temp Source 03/21/16 1533 Oral     SpO2 03/21/16 1533 99 %     Weight 03/21/16 1534 215 lb (97.5 kg)     Height 03/21/16 1534 5\' 8"  (1.727 m)     Head Circumference --      Peak Flow --      Pain Score --      Pain Loc --      Pain Edu? --      Excl. in [place]? --     Constitutional: Alert and oriented. Well appearing and in no acute distress. Eyes: Conjunctivae are normal. PERRL. EOMI. Head: Atraumatic. Nose: No congestion/rhinnorhea. Mouth/Throat: Mucous membranes are moist.  Oropharynx non-erythematous. Neck: No stridor.  No meningeal signs.   Cardiovascular: Normal rate, regular rhythm. Good peripheral circulation. Grossly normal heart sounds. Respiratory: Increased respiratory effort.  No retractions. Mild expiratory wheezing throughout. Gastrointestinal: Soft and nontender. No distention.  Musculoskeletal: No lower extremity tenderness nor edema. No gross deformities of extremities. Neurologic:  Normal speech and language. No gross focal neurologic deficits are appreciated.  Skin:  Skin is warm, dry and intact. No rash noted. Psychiatric: Mood and affect are normal. Speech and behavior are normal.  ____________________________________________   LABS (all labs ordered are listed, but only abnormal results are displayed)  Labs Reviewed  CBC - Abnormal; Notable for the following:       Result Value   Hemoglobin 9.9 (*)    HCT 30.7 (*)    MCV 66.9 (*)    MCH 21.6 (*)    RDW 19.4 (*)    Platelets 485 (*)    All other components within normal limits  COMPREHENSIVE METABOLIC PANEL - Abnormal; Notable for the following:    Glucose, Bld 100 (*)    Total Bilirubin <0.1 (*)    All other components within normal  limits  FIBRIN DERIVATIVES D-DIMER ([facility] ONLY)  POCT PREGNANCY, URINE  POC URINE PREG, ED   ____________________________________________  EKG  ED ECG REPORT I, Laysa Kimmey, the attending physician, personally viewed and interpreted this ECG.  Date: 03/21/2016 EKG Time: 15:33 Rate: 76 Rhythm: normal sinus rhythm with mild sinus arrhythmia QRS Axis: normal Intervals: normal ST/T Wave abnormalities: normal Conduction Disturbances: none Narrative Interpretation: unremarkable  ____________________________________________  RADIOLOGY   Dg Chest 2 View  Result Date: 03/21/2016 CLINICAL DATA:  Shortness of breath for 1 week. History of sarcoidosis. EXAM: CHEST  2 VIEW COMPARISON:  CT chest 09/19/2015 FINDINGS: The heart size and mediastinal contours are within normal limits. Both lungs are clear. The visualized skeletal structures are unremarkable. IMPRESSION: No active cardiopulmonary disease. Electronically Signed  By: Kathreen Devoid   On: 03/21/2016 16:38    ____________________________________________   PROCEDURES  Procedure(s) performed:   Procedures   Critical Care performed: No ____________________________________________   INITIAL IMPRESSION / ASSESSMENT AND PLAN / ED COURSE  Pertinent labs & imaging results that were available during my care of the patient were reviewed by me and considered in my medical decision making (see chart for details).  The patient's lab results and chest x-ray are unremarkable.  Given that she is on multiple medications for sarcoidosis, I will attempt to contact her pulmonologist and in the meantime give two additional Duonebs.   Clinical Course  Value Comment By Time   Attempted to page Dr. Melvyn Novas to discuss.  Treating with Duonebs x 2 while waiting to speak with pulmonology. Hinda Kehr, MD 09/07 1940   I spoke by phone with Dr. Jamal Collin (sp?) with pulmonology who is covering for Dr. Melvyn Novas.  We discussed the case in detail and he  reviewed the chart.  He brought up PE; we both agree the probability is low, and that a d-dimer will almost certainly be elevated, but given the concern with no alternative diagnosis (since she is early on major therapy for sarcoidosis) he thought it might be worth pursuing.  In an attempt to rule it out with a d-dimer, I will check that first, understanding that I will need to proceed with CT angiogram chest if the d-dimer is elevated.  I discussed all of this with the patient first and she understands and agrees with the plan to proceed with imaging if the dimer is elevated.  Hinda Kehr, MD 09/07 2011  Fibrin derivatives D-dimer [facility]): 283 Dimer is within normal limits, no need to proceed with CTA.  Discussed with patient, she will follow up as an outpatient. Hinda Kehr, MD 09/07 2104    ____________________________________________  FINAL CLINICAL IMPRESSION(S) / ED DIAGNOSES  Final diagnoses:  SOB (shortness of breath)     MEDICATIONS GIVEN DURING THIS VISIT:  Medications  albuterol (PROVENTIL) (2.5 MG/3ML) 0.083% nebulizer solution 5 mg (5 mg Nebulization Given 03/21/16 1547)  ipratropium-albuterol (DUONEB) 0.5-2.5 (3) MG/3ML nebulizer solution 3 mL (3 mLs Nebulization Given 03/21/16 1949)  ipratropium-albuterol (DUONEB) 0.5-2.5 (3) MG/3ML nebulizer solution 3 mL (3 mLs Nebulization Given 03/21/16 1949)     NEW OUTPATIENT MEDICATIONS STARTED DURING THIS VISIT:  New Prescriptions   ALBUTEROL (PROVENTIL HFA;VENTOLIN HFA) 108 (90 BASE) MCG/ACT INHALER    Inhale 4-6 puffs by mouth every 4 hours as needed for wheezing, cough, and/or shortness of breath   PREDNISONE (DELTASONE) 20 MG TABLET    Take 2 tablets (40 mg total) by mouth daily.    Modified Medications   No medications on file    Discontinued Medications   ALBUTEROL (PROVENTIL HFA;VENTOLIN HFA) 108 (90 BASE) MCG/ACT INHALER    Inhale 2 puffs into the lungs every 4 (four) hours as needed for wheezing or shortness of breath.    PREDNISONE (DELTASONE) 10 MG TABLET    Taper:   5 tabs po x3 days 4 tabs po x3 days 3 tabs po x3 days 2 tabs po x5 days 1 tab po x5 days     Note:  This document was prepared using Dragon voice recognition software and may include unintentional dictation errors.    Hinda Kehr, MD 03/21/16 2122

## 2016-05-09 ENCOUNTER — Encounter: Payer: Self-pay | Admitting: *Deleted

## 2016-06-22 IMAGING — CR DG CHEST 2V
1 series · 2 of 2 positions shown · non-contrast
Comparison: Chest radiograph performed 06/21/2015

CLINICAL DATA: Acute onset of generalized chest pain and difficulty
breathing. Personal history of sarcoidosis. Initial encounter.

EXAM:
CHEST  2 VIEW

[Series 1: dg chest 2 view · 0.14mm/px · 2 of 2 slices shown]
[im 1/2]
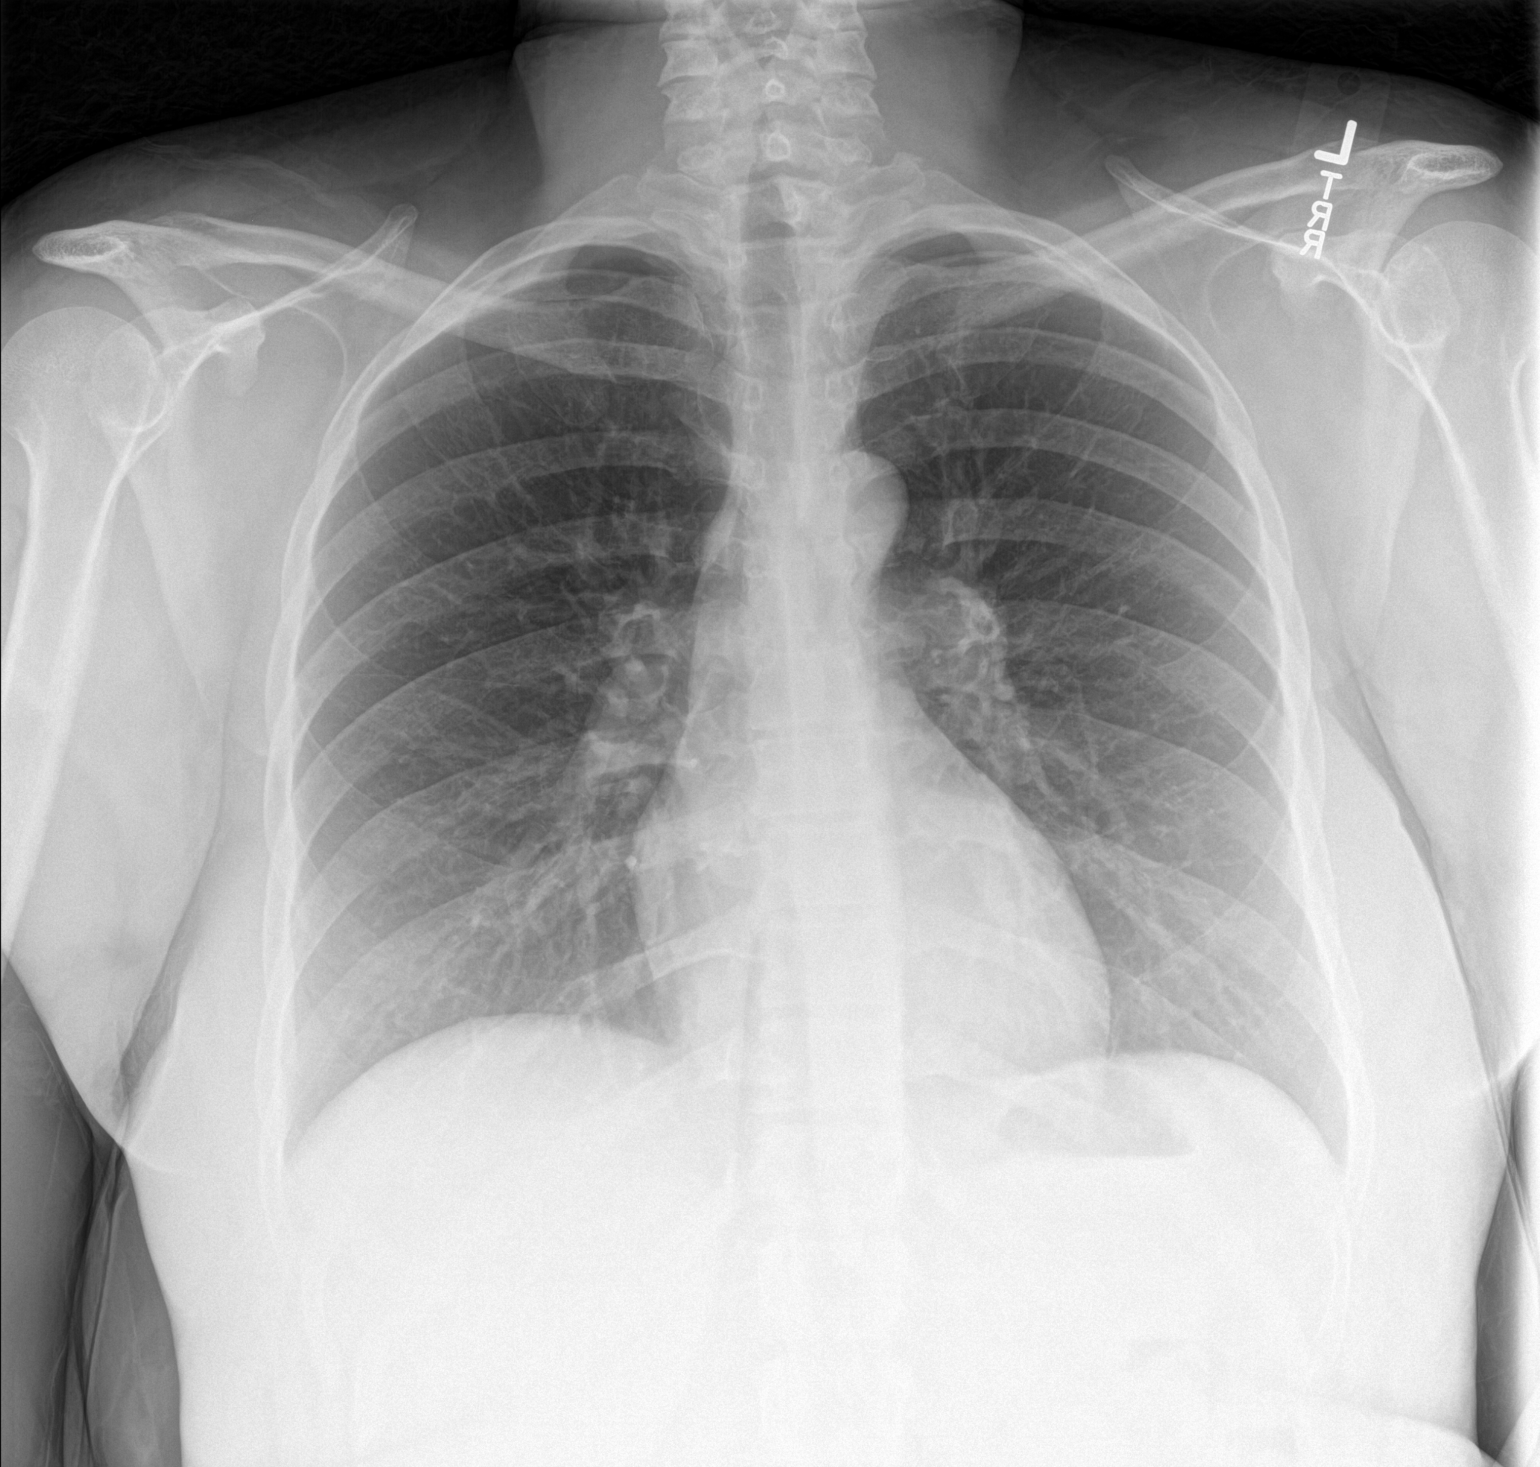
[im 2/2]
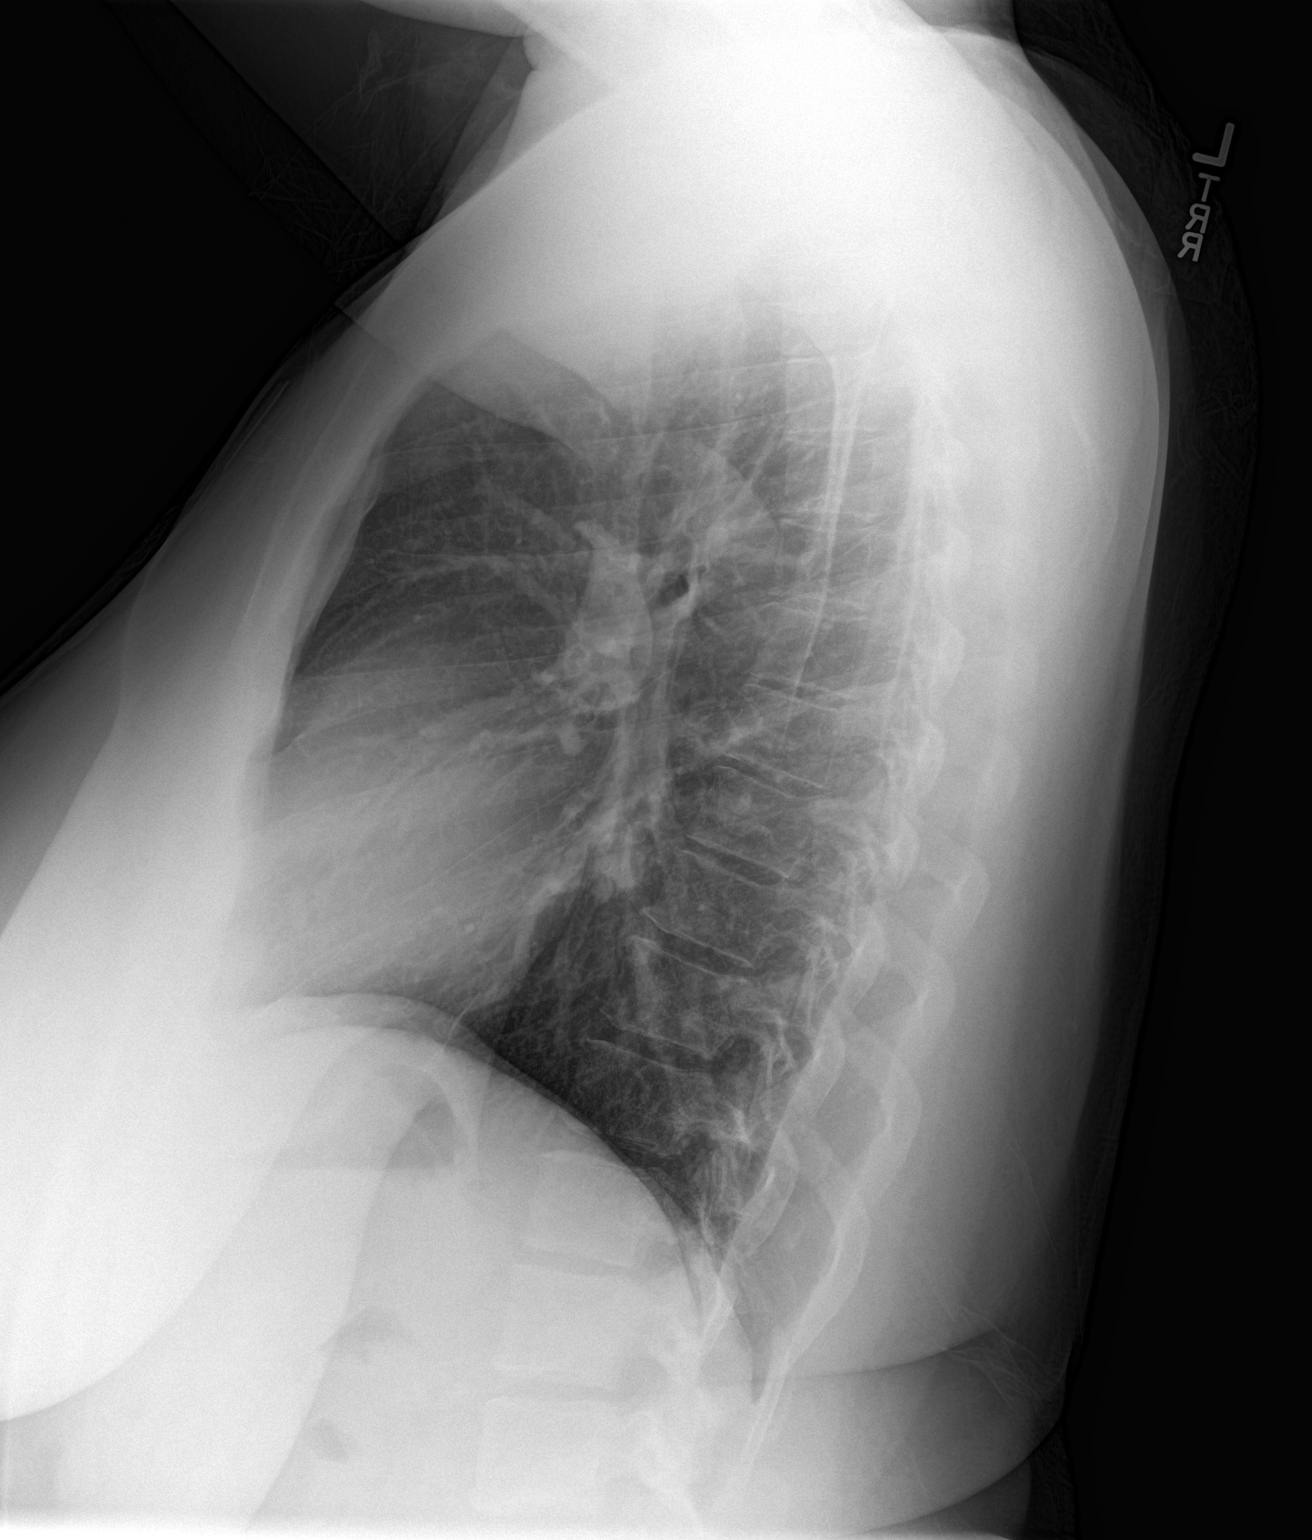

[2 of 2 positions shown; findings below may reference images not displayed]

FINDINGS: The lungs are well-aerated and clear. There is no evidence of focal
opacification, pleural effusion or pneumothorax.

The heart is normal in size; the mediastinal contour is within
normal limits. No acute osseous abnormalities are seen.
IMPRESSION: No acute cardiopulmonary process seen.

## 2017-04-15 ENCOUNTER — Encounter: Payer: Self-pay | Admitting: Obstetrics and Gynecology

## 2017-04-15 ENCOUNTER — Ambulatory Visit (INDEPENDENT_AMBULATORY_CARE_PROVIDER_SITE_OTHER): Payer: 59 | Admitting: Obstetrics and Gynecology

## 2017-04-15 VITALS — BP 130/80 | HR 76 | Ht 68.0 in | Wt 229.0 lb

## 2017-04-15 DIAGNOSIS — Z131 Encounter for screening for diabetes mellitus: Secondary | ICD-10-CM

## 2017-04-15 DIAGNOSIS — Z3169 Encounter for other general counseling and advice on procreation: Secondary | ICD-10-CM | POA: Diagnosis not present

## 2017-04-15 DIAGNOSIS — Z124 Encounter for screening for malignant neoplasm of cervix: Secondary | ICD-10-CM

## 2017-04-15 DIAGNOSIS — A630 Anogenital (venereal) warts: Secondary | ICD-10-CM

## 2017-04-15 DIAGNOSIS — Z1151 Encounter for screening for human papillomavirus (HPV): Secondary | ICD-10-CM

## 2017-04-15 DIAGNOSIS — Z01419 Encounter for gynecological examination (general) (routine) without abnormal findings: Secondary | ICD-10-CM | POA: Diagnosis not present

## 2017-04-15 DIAGNOSIS — Z1321 Encounter for screening for nutritional disorder: Secondary | ICD-10-CM | POA: Diagnosis not present

## 2017-04-15 NOTE — Progress Notes (Signed)
PCP:  McLean-Scocuzza, Nino Glow, MD   Chief Complaint  Patient presents with  . Gynecologic Exam     HPI:      [name] is a 36 y.o. G1P0 who LMP was Patient's last menstrual period was 03/20/2017., presents today for her annual examination.  Her menses are regular every 28-30 days, lasting 4 days.  Dysmenorrhea mild, occurring first 1-2 days of flow. She does not have intermenstrual bleeding.  Sex activity: single partner, contraception -none. Wants to conceive. Has never conceived. Partner without children. Is not on any sarcoid meds now other than humira. Has done steroids and methotrexate in the past which wasn't safe to use with conception.   Last Pap: March 30, 2015  Results were: no abnormalities /POS HPV DNA , repeat due today. Hx of STDs: HPV  Last mammogram: with Dr. Jamal Collin due to bilat fibroadenomas removed.  There is a FH of breast cancer in her mat aunt, genetic testing not indicated . There is no FH of ovarian cancer. The patient does do self-breast exams. She has a hx of bilat fibroadenomas removed with Dr. Jamal Collin in 8/16.  Tobacco use: The patient denies current or previous tobacco use. Alcohol use: social drinker No drug use.  Exercise: moderately active  She does get adequate calcium but not Vitamin D in her diet.  Fasting labs 9/16 with pre-DM (most likely due to steroid use but not rechecked), normal lipids, and severe Vit D deficiency. Pt is not doing supp because she was told values due to steroid use.    Past Medical History:  Diagnosis Date  . Fibroadenoma of both breasts   . Pre-diabetes   . Sarcoidosis    2009  . Vitamin D deficiency     Past Surgical History:  Procedure Laterality Date  . BREAST BIOPSY Right neg   2002 Dr. Larinda Buttery   . BREAST BIOPSY Bilateral 03/06/2015   Procedure: BILATERAL BREAST MASS EXCISION, right breast mass excision with ultrasound guided needle localization ;  Surgeon: Christene Lye, MD;  Location:  [facility] ORS;  Service: General;  Laterality: Bilateral;  . COLONOSCOPY  2011   [place]  2007  . mass removed  2002  . TONSILLECTOMY    . UPPER GI ENDOSCOPY  2007  . WISDOM TOOTH EXTRACTION  2006    Family History  Problem Relation Age of Onset  . Breast cancer Maternal Aunt 65  . Pancreatic cancer Maternal Grandfather 51    Social History   Social History  . Marital status: Married    Spouse name: N/A  . Number of children: N/A  . Years of education: N/A   Occupational History  . Not on file.   Social History Main Topics  . Smoking status: Never Smoker  . Smokeless tobacco: Never Used  . Alcohol use 0.0 oz/week     Comment: Occasionally  . Drug use: No  . Sexual activity: Yes    Birth control/ protection: None   Other Topics Concern  . Not on file   Social History Narrative  . No narrative on file    Current Meds  Medication Sig  . HUMIRA PEN 40 MG/0.8ML PNKT      ROS:  Review of Systems  Constitutional: Negative for fatigue, fever and unexpected weight change.  Respiratory: Negative for cough, shortness of breath and wheezing.   Cardiovascular: Negative for chest pain, palpitations and leg swelling.  Gastrointestinal: Negative for  blood in stool, constipation, diarrhea, nausea and vomiting.  Endocrine: Negative for cold intolerance, heat intolerance and polyuria.  Genitourinary: Negative for dyspareunia, dysuria, flank pain, frequency, genital sores, hematuria, menstrual problem, pelvic pain, urgency, vaginal bleeding, vaginal discharge and vaginal pain.  Musculoskeletal: Negative for back pain, joint swelling and myalgias.  Skin: Negative for rash.  Neurological: Negative for dizziness, syncope, light-headedness, numbness and headaches.  Hematological: Negative for adenopathy.  Psychiatric/Behavioral: Negative for agitation, confusion, sleep disturbance and suicidal ideas. The patient is not nervous/anxious.       Objective: BP 130/80   Pulse 76   Ht 5' 8" (1.727 m)   Wt 229 lb (103.9 kg)   LMP 03/20/2017   BMI 34.82 kg/m    Physical Exam  Constitutional: She is oriented to person, place, and time. She appears well-developed and well-nourished.  Genitourinary: Vagina normal and uterus normal. There is no rash or tenderness on the right labia. There is no rash or tenderness on the left labia. No erythema or tenderness in the vagina. No vaginal discharge found. Right adnexum does not display mass and does not display tenderness. Left adnexum does not display mass and does not display tenderness. Cervix does not exhibit motion tenderness or polyp. Uterus is not enlarged or tender.  Neck: Normal range of motion. No thyromegaly present.  Cardiovascular: Normal rate, regular rhythm and normal heart sounds.   No murmur heard. Pulmonary/Chest: Effort normal and breath sounds normal. Right breast exhibits no mass, no nipple discharge, no skin change and no tenderness. Left breast exhibits no mass, no nipple discharge, no skin change and no tenderness.  Abdominal: Soft. There is no tenderness. There is no guarding.  Musculoskeletal: Normal range of motion.  Neurological: She is alert and oriented to person, place, and time. No cranial nerve deficit.  Psychiatric: She has a normal mood and affect. Her behavior is normal.  Vitals reviewed.   Assessment/Plan: Encounter for annual routine gynecological examination  Cervical cancer screening - Will call pt with results.  - Plan: IGP, Aptima HPV  Screening for HPV (human papillomavirus) - Plan: IGP, Aptima HPV  Encounter for vitamin deficiency screening - REchk labs. Will call pt wiht results.  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 Wash in 4-6 hrs. F/u prn. Hx of HPV  on pap.  Pre-conception counseling - Try urine ovulation pred kit. Start PNVs. If normal, will check semen analysis/HSG. F/u with results. Humira ok with conception. 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  Alicia B. Copland, [name] 04/15/2017 11:45 AM 

## 2017-04-16 LAB — HEMOGLOBIN A1C
Est. average glucose Bld gHb Est-mCnc: 114 mg/dL
HEMOGLOBIN A1C: 5.6 % (ref 4.8–5.6)

## 2017-04-16 LAB — VITAMIN D 25 HYDROXY (VIT D DEFICIENCY, FRACTURES): Vit D, 25-Hydroxy: 11.9 ng/mL — ABNORMAL LOW (ref 30.0–100.0)

## 2017-04-17 LAB — IGP, APTIMA HPV
HPV Aptima: POSITIVE — AB
PAP Smear Comment: 0

## 2017-04-22 ENCOUNTER — Telehealth: Payer: Self-pay | Admitting: Obstetrics and Gynecology

## 2017-04-22 ENCOUNTER — Encounter: Payer: Self-pay | Admitting: Obstetrics and Gynecology

## 2017-04-22 DIAGNOSIS — A599 Trichomoniasis, unspecified: Secondary | ICD-10-CM | POA: Insufficient documentation

## 2017-04-22 NOTE — Telephone Encounter (Signed)
Pt's questions answered. Dr. Georgianne Fick, pt seeing you for colpo for 2 yrs of neg pap/POS HPV DNA. Also been on immune modulators for RA. Pap showed trich. Pt with d/c, irritation sx. Can you pls do wet prep/eval at colpo (to save her extra return visit?). Thx.

## 2017-04-22 NOTE — Telephone Encounter (Signed)
Pt aware of neg pap/pos HPV DNA. Due for colpo since had same pap results last time.  Pap also showed trich. Pt having itch/irritation/d/c. Will RTO for pelvic/wet prep/nuswab.

## 2017-04-22 NOTE — Telephone Encounter (Signed)
Colpo with MD. Pelvic with me (abc) if colpo greater that 1 wk

## 2017-04-22 NOTE — Telephone Encounter (Signed)
Pt is schedule Tues, Oct 16 at 3:30 with AMS. Pt would like a call back from [employer].

## 2017-04-29 ENCOUNTER — Encounter: Payer: Self-pay | Admitting: Obstetrics and Gynecology

## 2017-04-29 ENCOUNTER — Ambulatory Visit (INDEPENDENT_AMBULATORY_CARE_PROVIDER_SITE_OTHER): Payer: 59 | Admitting: Obstetrics and Gynecology

## 2017-04-29 VITALS — BP 142/82 | HR 82 | Ht 68.0 in | Wt 237.0 lb

## 2017-04-29 DIAGNOSIS — B977 Papillomavirus as the cause of diseases classified elsewhere: Secondary | ICD-10-CM | POA: Diagnosis not present

## 2017-04-29 DIAGNOSIS — N72 Inflammatory disease of cervix uteri: Secondary | ICD-10-CM

## 2017-04-29 DIAGNOSIS — Z23 Encounter for immunization: Secondary | ICD-10-CM

## 2017-04-29 DIAGNOSIS — A5901 Trichomonal vulvovaginitis: Secondary | ICD-10-CM | POA: Diagnosis not present

## 2017-04-29 NOTE — Progress Notes (Signed)
   GYNECOLOGY CLINIC COLPOSCOPY PROCEDURE NOTE  36 y.o. G1P0 here for colposcopy for cytology showing no abnormalities, but persistent HPV onpap smear on 04/15/2017, pap was also notable for trichomonas for which the patient received treatment. Discussed underlying role for HPV infection in the development of cervical dysplasia, its natural history and progression/regression, need for surveillance.  She is on immunologic drugs for sarcoid which we discussed may prolong HPV clearance rate.  Is the patient  pregnant: No LMP: Patient's last menstrual period was 04/20/2017. Smoking status:  Metrics: Intervention Frequency ACO  Documented Smoking Status Yearly  Screened one or more times in 24 months  Cessation Counseling or  Active cessation medication Past 24 months  Past 24 months   Guideline developer: UpToDate (See UpToDate for funding source) Date Released: 2014  History of STD:  Yes  Patient given informed consent, signed copy in the chart, time out was performed.  The patient was position in dorsal lithotomy position. Speculum was placed the cervix was visualized.   After application of acetic acid colposcopic inspection of the cervix was undertaken.   Colposcopy adequate, full visualization of transformation zone: Yes no visible lesions; corresponding biopsies obtained.  (Random 12 O'Clock Biopsy) ECC specimen obtained:  Yes  All specimens were labeled and sent to pathology.   Patient was given post procedure instructions.  Will follow up pathology and manage accordingly.  Routine preventative health maintenance measures emphasized.  Wet Prep: PH: N/A Clue Cells: Negative Fungal elements: Negative Trichomonas: Positive  Physical Exam  Genitourinary:      - samples of tinidazole provided for treatment of patient and her partner 2g po once dose  Malachy Mood, MD, [place], [place]

## 2017-05-01 LAB — PATHOLOGY

## 2017-05-06 ENCOUNTER — Telehealth: Payer: Self-pay | Admitting: Obstetrics and Gynecology

## 2017-05-06 NOTE — Telephone Encounter (Signed)
Pt is calling about her Colpo results. Please advise. Cb# [phone]

## 2017-05-06 NOTE — Telephone Encounter (Signed)
Please advise 

## 2017-06-26 ENCOUNTER — Encounter: Payer: 59 | Admitting: Obstetrics and Gynecology

## 2017-07-05 ENCOUNTER — Encounter
Admission: EM | Disposition: A | Discharge status: Home / Self Care | Payer: Self-pay | Source: Home / Self Care | Attending: Emergency Medicine

## 2017-07-05 ENCOUNTER — Emergency Department
Admission: EM | Admit: 2017-07-05 | Discharge: 2017-07-05 | Disposition: A | Discharge status: Home / Self Care | Payer: PRIVATE HEALTH INSURANCE | Attending: Obstetrics & Gynecology | Admitting: Obstetrics & Gynecology

## 2017-07-05 ENCOUNTER — Emergency Department: Payer: PRIVATE HEALTH INSURANCE | Admitting: Certified Registered Nurse Anesthetist

## 2017-07-05 ENCOUNTER — Encounter: Payer: Self-pay | Admitting: Emergency Medicine

## 2017-07-05 ENCOUNTER — Other Ambulatory Visit: Payer: Self-pay

## 2017-07-05 ENCOUNTER — Emergency Department: Payer: PRIVATE HEALTH INSURANCE

## 2017-07-05 DIAGNOSIS — D259 Leiomyoma of uterus, unspecified: Secondary | ICD-10-CM | POA: Insufficient documentation

## 2017-07-05 DIAGNOSIS — Z3A11 11 weeks gestation of pregnancy: Secondary | ICD-10-CM | POA: Diagnosis not present

## 2017-07-05 DIAGNOSIS — O99011 Anemia complicating pregnancy, first trimester: Secondary | ICD-10-CM | POA: Insufficient documentation

## 2017-07-05 DIAGNOSIS — O99111 Other diseases of the blood and blood-forming organs and certain disorders involving the immune mechanism complicating pregnancy, first trimester: Secondary | ICD-10-CM | POA: Insufficient documentation

## 2017-07-05 DIAGNOSIS — Z803 Family history of malignant neoplasm of breast: Secondary | ICD-10-CM | POA: Diagnosis not present

## 2017-07-05 DIAGNOSIS — N939 Abnormal uterine and vaginal bleeding, unspecified: Secondary | ICD-10-CM | POA: Diagnosis present

## 2017-07-05 DIAGNOSIS — O071 Delayed or excessive hemorrhage following failed attempted termination of pregnancy: Secondary | ICD-10-CM | POA: Diagnosis not present

## 2017-07-05 DIAGNOSIS — O034 Incomplete spontaneous abortion without complication: Secondary | ICD-10-CM

## 2017-07-05 DIAGNOSIS — T884XXA Failed or difficult intubation, initial encounter: Secondary | ICD-10-CM

## 2017-07-05 DIAGNOSIS — E559 Vitamin D deficiency, unspecified: Secondary | ICD-10-CM | POA: Diagnosis not present

## 2017-07-05 DIAGNOSIS — Z419 Encounter for procedure for purposes other than remedying health state, unspecified: Secondary | ICD-10-CM

## 2017-07-05 DIAGNOSIS — Z79899 Other long term (current) drug therapy: Secondary | ICD-10-CM | POA: Diagnosis not present

## 2017-07-05 DIAGNOSIS — O0489 (Induced) termination of pregnancy with other complications: Secondary | ICD-10-CM | POA: Diagnosis not present

## 2017-07-05 DIAGNOSIS — Z800 Family history of malignant neoplasm of digestive organs: Secondary | ICD-10-CM | POA: Diagnosis not present

## 2017-07-05 DIAGNOSIS — R7303 Prediabetes: Secondary | ICD-10-CM | POA: Insufficient documentation

## 2017-07-05 DIAGNOSIS — D649 Anemia, unspecified: Secondary | ICD-10-CM | POA: Diagnosis not present

## 2017-07-05 DIAGNOSIS — N99820 Postprocedural hemorrhage and hematoma of a genitourinary system organ or structure following a genitourinary system procedure: Secondary | ICD-10-CM

## 2017-07-05 DIAGNOSIS — R109 Unspecified abdominal pain: Secondary | ICD-10-CM | POA: Diagnosis not present

## 2017-07-05 HISTORY — PX: DILATION AND CURETTAGE OF UTERUS: SHX78

## 2017-07-05 LAB — CBC WITH DIFFERENTIAL/PLATELET
BASOS ABS: 0.1 10*3/uL (ref 0–0.1)
BASOS PCT: 0 %
Eosinophils Absolute: 0 10*3/uL (ref 0–0.7)
Eosinophils Relative: 0 %
HEMATOCRIT: 28.9 % — AB (ref 35.0–47.0)
Hemoglobin: 8.8 g/dL — ABNORMAL LOW (ref 12.0–16.0)
LYMPHS PCT: 10 %
Lymphs Abs: 1.2 10*3/uL (ref 1.0–3.6)
MCH: 18.3 pg — ABNORMAL LOW (ref 26.0–34.0)
MCHC: 30.3 g/dL — ABNORMAL LOW (ref 32.0–36.0)
MCV: 60.4 fL — AB (ref 80.0–100.0)
Monocytes Absolute: 0.6 10*3/uL (ref 0.2–0.9)
Monocytes Relative: 5 %
NEUTROS ABS: 10.9 10*3/uL — AB (ref 1.4–6.5)
NEUTROS PCT: 85 %
PLATELETS: 412 10*3/uL (ref 150–440)
RBC: 4.78 MIL/uL (ref 3.80–5.20)
RDW: 20.3 % — AB (ref 11.5–14.5)
WBC: 12.8 10*3/uL — AB (ref 3.6–11.0)

## 2017-07-05 LAB — URINALYSIS, COMPLETE (UACMP) WITH MICROSCOPIC
BACTERIA UA: NONE SEEN
SPECIFIC GRAVITY, URINE: 1.025 (ref 1.005–1.030)
SQUAMOUS EPITHELIAL / LPF: NONE SEEN

## 2017-07-05 LAB — ABO/RH: ABO/RH(D): B POS

## 2017-07-05 LAB — BASIC METABOLIC PANEL
ANION GAP: 9 (ref 5–15)
BUN: 8 mg/dL (ref 6–20)
CALCIUM: 9.1 mg/dL (ref 8.9–10.3)
CO2: 20 mmol/L — AB (ref 22–32)
Chloride: 104 mmol/L (ref 101–111)
Creatinine, Ser: 0.63 mg/dL (ref 0.44–1.00)
GLUCOSE: 118 mg/dL — AB (ref 65–99)
POTASSIUM: 3.9 mmol/L (ref 3.5–5.1)
Sodium: 133 mmol/L — ABNORMAL LOW (ref 135–145)

## 2017-07-05 LAB — PREGNANCY, URINE: Preg Test, Ur: POSITIVE — AB

## 2017-07-05 SURGERY — DILATION AND CURETTAGE
Anesthesia: General

## 2017-07-05 MED ORDER — METHYLERGONOVINE MALEATE 0.2 MG/ML IJ SOLN
INTRAMUSCULAR | Status: AC
  Filled 2017-07-05: qty 1

## 2017-07-05 MED ORDER — METHYLPREDNISOLONE SODIUM SUCC 125 MG IJ SOLR
INTRAMUSCULAR | Status: AC
  Filled 2017-07-05: qty 2

## 2017-07-05 MED ORDER — DEXTROSE 5 % IV SOLN
200.0000 mg | INTRAVENOUS | Status: AC
  Administered 2017-07-05: 200 mg via INTRAVENOUS
  Filled 2017-07-05: qty 200

## 2017-07-05 MED ORDER — FENTANYL CITRATE (PF) 100 MCG/2ML IJ SOLN
25.0000 ug | INTRAMUSCULAR | Status: DC | PRN
  Administered 2017-07-05 (×2): 25 ug via INTRAVENOUS

## 2017-07-05 MED ORDER — ONDANSETRON HCL 4 MG/2ML IJ SOLN
4.0000 mg | Freq: Once | INTRAMUSCULAR | Status: AC
  Administered 2017-07-05: 4 mg via INTRAVENOUS
  Filled 2017-07-05: qty 2

## 2017-07-05 MED ORDER — PROPOFOL 10 MG/ML IV BOLUS
INTRAVENOUS | Status: AC
  Filled 2017-07-05: qty 20

## 2017-07-05 MED ORDER — METHYLPREDNISOLONE SODIUM SUCC 125 MG IJ SOLR
INTRAMUSCULAR | Status: DC | PRN
  Administered 2017-07-05: 125 mg via INTRAVENOUS

## 2017-07-05 MED ORDER — SUCCINYLCHOLINE CHLORIDE 20 MG/ML IJ SOLN
INTRAMUSCULAR | Status: DC | PRN
  Administered 2017-07-05 (×2): 100 mg via INTRAVENOUS

## 2017-07-05 MED ORDER — FENTANYL CITRATE (PF) 100 MCG/2ML IJ SOLN
INTRAMUSCULAR | Status: AC
  Filled 2017-07-05: qty 2

## 2017-07-05 MED ORDER — ROCURONIUM BROMIDE 50 MG/5ML IV SOLN
INTRAVENOUS | Status: AC
  Filled 2017-07-05: qty 1

## 2017-07-05 MED ORDER — DEXAMETHASONE SODIUM PHOSPHATE 10 MG/ML IJ SOLN
INTRAMUSCULAR | Status: AC
  Filled 2017-07-05: qty 1

## 2017-07-05 MED ORDER — DEXAMETHASONE SODIUM PHOSPHATE 10 MG/ML IJ SOLN
INTRAMUSCULAR | Status: AC
  Filled 2017-07-05: qty 2

## 2017-07-05 MED ORDER — ACETAMINOPHEN 500 MG PO TABS
1000.0000 mg | ORAL_TABLET | Freq: Once | ORAL | Status: AC
  Administered 2017-07-05: 1000 mg via ORAL
  Filled 2017-07-05: qty 2

## 2017-07-05 MED ORDER — MORPHINE SULFATE (PF) 4 MG/ML IV SOLN
4.0000 mg | Freq: Once | INTRAVENOUS | Status: AC
  Administered 2017-07-05: 4 mg via INTRAVENOUS
  Filled 2017-07-05: qty 1

## 2017-07-05 MED ORDER — ONDANSETRON HCL 4 MG/2ML IJ SOLN
4.0000 mg | Freq: Once | INTRAMUSCULAR | Status: AC | PRN
  Administered 2017-07-05: 4 mg via INTRAVENOUS

## 2017-07-05 MED ORDER — OXYCODONE-ACETAMINOPHEN 5-325 MG PO TABS
1.0000 | ORAL_TABLET | Freq: Four times a day (QID) | ORAL | 0 refills | Status: DC | PRN
Start: 2017-07-05 — End: 2017-10-17

## 2017-07-05 MED ORDER — GLYCOPYRROLATE 0.2 MG/ML IJ SOLN
INTRAMUSCULAR | Status: DC | PRN
  Administered 2017-07-05: 0.2 mg via INTRAVENOUS

## 2017-07-05 MED ORDER — LIDOCAINE HCL (PF) 2 % IJ SOLN
INTRAMUSCULAR | Status: AC
  Filled 2017-07-05: qty 10

## 2017-07-05 MED ORDER — DIPHENHYDRAMINE HCL 50 MG/ML IJ SOLN
INTRAMUSCULAR | Status: AC
  Filled 2017-07-05: qty 1

## 2017-07-05 MED ORDER — PROPOFOL 10 MG/ML IV BOLUS
INTRAVENOUS | Status: DC | PRN
  Administered 2017-07-05: 50 mg via INTRAVENOUS
  Administered 2017-07-05: 200 mg via INTRAVENOUS
  Administered 2017-07-05: 50 mg via INTRAVENOUS

## 2017-07-05 MED ORDER — METHYLERGONOVINE MALEATE 0.2 MG/ML IJ SOLN
INTRAMUSCULAR | Status: DC | PRN
  Administered 2017-07-05: 0.2 mg via INTRAMUSCULAR

## 2017-07-05 MED ORDER — FENTANYL CITRATE (PF) 100 MCG/2ML IJ SOLN
INTRAMUSCULAR | Status: AC
  Administered 2017-07-05: 25 ug via INTRAVENOUS
  Filled 2017-07-05: qty 2

## 2017-07-05 MED ORDER — DEXAMETHASONE SODIUM PHOSPHATE 10 MG/ML IJ SOLN
INTRAMUSCULAR | Status: DC | PRN
  Administered 2017-07-05: 10 mg via INTRAVENOUS
  Administered 2017-07-05: 20 mg via INTRAVENOUS

## 2017-07-05 MED ORDER — NITROFURANTOIN MONOHYD MACRO 100 MG PO CAPS
100.0000 mg | ORAL_CAPSULE | Freq: Once | ORAL | Status: AC
  Administered 2017-07-05: 100 mg via ORAL
  Filled 2017-07-05: qty 1

## 2017-07-05 MED ORDER — NITROFURANTOIN MONOHYD MACRO 100 MG PO CAPS: 100.0000 mg | ORAL_CAPSULE | Freq: Two times a day (BID) | ORAL | 0 refills | Status: AC

## 2017-07-05 MED ORDER — MIDAZOLAM HCL 2 MG/2ML IJ SOLN
INTRAMUSCULAR | Status: AC
  Filled 2017-07-05: qty 2

## 2017-07-05 MED ORDER — FENTANYL CITRATE (PF) 100 MCG/2ML IJ SOLN
50.0000 ug | Freq: Once | INTRAMUSCULAR | Status: AC
  Administered 2017-07-05: 50 ug via INTRAVENOUS
  Filled 2017-07-05: qty 2

## 2017-07-05 MED ORDER — ACETAMINOPHEN 10 MG/ML IV SOLN
INTRAVENOUS | Status: AC
  Filled 2017-07-05: qty 100

## 2017-07-05 MED ORDER — FENTANYL CITRATE (PF) 100 MCG/2ML IJ SOLN
INTRAMUSCULAR | Status: DC | PRN
  Administered 2017-07-05 (×4): 50 ug via INTRAVENOUS

## 2017-07-05 MED ORDER — OXYCODONE HCL 5 MG PO TABS
5.0000 mg | ORAL_TABLET | Freq: Once | ORAL | Status: AC
  Administered 2017-07-05: 5 mg via ORAL
  Filled 2017-07-05: qty 1

## 2017-07-05 MED ORDER — LIDOCAINE HCL (CARDIAC) 20 MG/ML IV SOLN
INTRAVENOUS | Status: DC | PRN
  Administered 2017-07-05: 100 mg via INTRAVENOUS

## 2017-07-05 MED ORDER — ACETAMINOPHEN 10 MG/ML IV SOLN
INTRAVENOUS | Status: DC | PRN
  Administered 2017-07-05: 1000 mg via INTRAVENOUS

## 2017-07-05 MED ORDER — ONDANSETRON HCL 4 MG/2ML IJ SOLN
INTRAMUSCULAR | Status: AC
  Filled 2017-07-05: qty 2

## 2017-07-05 MED ORDER — HYDROMORPHONE HCL 1 MG/ML IJ SOLN
0.5000 mg | Freq: Once | INTRAMUSCULAR | Status: AC
  Administered 2017-07-05: 0.5 mg via INTRAVENOUS
  Filled 2017-07-05: qty 1

## 2017-07-05 MED ORDER — DIPHENHYDRAMINE HCL 50 MG/ML IJ SOLN
INTRAMUSCULAR | Status: DC | PRN
  Administered 2017-07-05: 25 mg via INTRAVENOUS

## 2017-07-05 MED ORDER — ONDANSETRON HCL 4 MG/2ML IJ SOLN
INTRAMUSCULAR | Status: DC | PRN
  Administered 2017-07-05: 4 mg via INTRAVENOUS

## 2017-07-05 MED ORDER — MIDAZOLAM HCL 2 MG/2ML IJ SOLN
INTRAMUSCULAR | Status: DC | PRN
  Administered 2017-07-05: 2 mg via INTRAVENOUS

## 2017-07-05 MED ORDER — LACTATED RINGERS IV SOLN
INTRAVENOUS | Status: DC
  Administered 2017-07-05: 12:00:00 via INTRAVENOUS

## 2017-07-05 SURGICAL SUPPLY — 25 items
ABLATOR ENDOMETRIAL MYOSURE (ABLATOR) IMPLANT
BAG COUNTER SPONGE EZ (MISCELLANEOUS) IMPLANT
CANISTER SUC SOCK COL 7IN (MISCELLANEOUS) IMPLANT
CATH ROBINSON RED A/P 16FR (CATHETERS) ×3 IMPLANT
COUNTER SPONGE BAG EZ (MISCELLANEOUS)
DEVICE MYOSURE LITE (MISCELLANEOUS) IMPLANT
DRSG TELFA 3X8 NADH (GAUZE/BANDAGES/DRESSINGS) ×3 IMPLANT
DRSG TELFA 4X3 1S NADH ST (GAUZE/BANDAGES/DRESSINGS) ×3 IMPLANT
ELECT REM PT RETURN 9FT ADLT (ELECTROSURGICAL)
ELECTRODE REM PT RTRN 9FT ADLT (ELECTROSURGICAL) IMPLANT
GOWN STRL REUS W/ TWL LRG LVL3 (GOWN DISPOSABLE) ×2 IMPLANT
GOWN STRL REUS W/TWL LRG LVL3 (GOWN DISPOSABLE) ×4
PACK DNC HYST (MISCELLANEOUS) ×3 IMPLANT
PAD OB MATERNITY 4.3X12.25 (PERSONAL CARE ITEMS) ×3 IMPLANT
PAD PREP 24X41 OB/GYN DISP (PERSONAL CARE ITEMS) IMPLANT
SET BERKELEY SUCTION TUBING (SUCTIONS) ×3 IMPLANT
SOL .9 NS 3000ML IRR  AL (IV SOLUTION)
SOL .9 NS 3000ML IRR UROMATIC (IV SOLUTION) IMPLANT
SPONGE XRAY 4X4 16PLY STRL (MISCELLANEOUS) ×9 IMPLANT
STRAP SAFETY BODY (MISCELLANEOUS) IMPLANT
TOWEL OR 17X26 4PK STRL BLUE (TOWEL DISPOSABLE) ×3 IMPLANT
TUBING CONNECTING 10 (TUBING) IMPLANT
TUBING CONNECTING 10' (TUBING)
TUBING HYSTEROSCOPY DOLPHIN (MISCELLANEOUS) IMPLANT
VACURETTE 8 RIGID CVD (CANNULA) ×3 IMPLANT

## 2017-07-05 NOTE — Anesthesia Post-op Follow-up Note (Signed)
Anesthesia QCDR form completed.        

## 2017-07-05 NOTE — Anesthesia Procedure Notes (Addendum)
Procedure Name: Intubation Date/Time: 07/05/2017 1:30 PM Performed by: Johnna Acosta, CRNA Pre-anesthesia Checklist: Patient identified, Emergency Drugs available, Suction available, Patient being monitored and Timeout performed Patient Re-evaluated:Patient Re-evaluated prior to induction Oxygen Delivery Method: Circle system utilized Preoxygenation: Pre-oxygenation with 100% oxygen Induction Type: IV induction Ventilation: Mask ventilation with difficulty, Oral airway inserted - appropriate to patient size and Two handed mask ventilation required Laryngoscope Size: Miller, 2, Mac and 4 Grade View: Grade II Tube type: Oral Tube size: 7.5 mm Number of attempts: 2 Airway Equipment and Method: Stylet and Bougie stylet Placement Confirmation: ETT inserted through vocal cords under direct vision,  positive ETCO2 and breath sounds checked- equal and bilateral Secured at: 21 cm Tube secured with: Tape Dental Injury: Teeth and Oropharynx as per pre-operative assessment  Difficulty Due To: Difficulty was unanticipated Future Recommendations: Recommend- induction with short-acting agent, and alternative techniques readily available Comments: Unable to get good TV with LMA Dr Kayleen Memos at bedside  LMA removed DVL with Sabra Heck able to visualize cords unable to pass, bougie inserted without resistance however ETT unable to pass. Mask ventilation with OA  Dr Kayleen Memos DVL with MAC 4 able to visualize extra tissue or stricture  ETT passed. Recommend 6.0 or 6.5 ETT

## 2017-07-05 NOTE — Anesthesia Preprocedure Evaluation (Addendum)
Anesthesia Evaluation  Patient identified by MRN, date of birth, ID band Patient awake    Reviewed: Allergy & Precautions, H&P , NPO status , Patient's Chart, lab work & pertinent test results, reviewed documented beta blocker date and time   History of Anesthesia Complications (+) PONV and history of anesthetic complications  Airway Mallampati: II  TM Distance: >3 FB Neck ROM: full    Dental no notable dental hx. (+) Teeth Intact   Pulmonary neg pulmonary ROS, shortness of breath and with exertion,    Pulmonary exam normal breath sounds clear to auscultation       Cardiovascular Exercise Tolerance: Good negative cardio ROS Normal cardiovascular exam Rhythm:regular Rate:Normal     Neuro/Psych negative neurological ROS  negative psych ROS   GI/Hepatic negative GI ROS, Neg liver ROS,   Endo/Other  negative endocrine ROS  Renal/GU negative Renal ROS  negative genitourinary   Musculoskeletal   Abdominal   Peds  Hematology  (+) anemia ,   Anesthesia Other Findings Past Medical History:   Sarcoidosis                                                    Comment:2009   Reproductive/Obstetrics negative OB ROS                            Anesthesia Physical  Anesthesia Plan  ASA: II  Anesthesia Plan: General   Post-op Pain Management:    Induction:   PONV Risk Score and Plan:   Airway Management Planned: LMA  Additional Equipment:   Intra-op Plan:   Post-operative Plan: Extubation in OR  Informed Consent: I have reviewed the patients History and Physical, chart, labs and discussed the procedure including the risks, benefits and alternatives for the proposed anesthesia with the patient or authorized representative who has indicated his/her understanding and acceptance.   Dental Advisory Given  Plan Discussed with: Anesthesiologist, CRNA and Surgeon  Anesthesia Plan Comments:          Anesthesia Quick Evaluation

## 2017-07-05 NOTE — ED Notes (Signed)
PT REQUESTING MEDICAL INFORMATION NOT TO BE DISCUSSED IN FRONT OF ANY VISITORS EXCEPT HER MOTHER.

## 2017-07-05 NOTE — ED Provider Notes (Signed)
[facility] Emergency Department Provider Note  ____________________________________________  Time seen: Approximately 4:01 AM  I have reviewed the triage vital signs and the nursing notes.   HISTORY  Chief Complaint Abdominal Pain   HPI Marie Moran is a 36 y.o. female presents for evaluation of abdominal pain. Patient reportsthat she underwent a abortion 4 days ago. She was [redacted] weeks pregnant and underwent suction of products of conception. She has been having bleeding like a menstrual period since then but yesterday morning woke up with severe lower abdominal cramping. She reports taking 7 x 800 mg motrin in the last 24 hours with no significant improvement of the pain. She is also complaining of mild dysuria. No nausea or vomiting, no vaginal discharge, no constipation or diarrhea, no chest pain or shortness of breath. Her pain is currently severe.  Past Medical History:  Diagnosis Date  . Fibroadenoma of both breasts   . Pre-diabetes   . Sarcoidosis    2009  . Vitamin D deficiency     Patient Active Problem List   Diagnosis Date Noted  . Trichomonas infection 04/22/2017  . 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 Dyspnea 08/11/2015  . Sarcoidosis 08/11/2015    Past Surgical History:  Procedure Laterality Date  . BREAST BIOPSY Right neg   2002 Dr. Larinda Buttery   . BREAST BIOPSY Bilateral 03/06/2015   Procedure: BILATERAL BREAST MASS EXCISION, right breast mass excision with ultrasound guided needle localization ;  Surgeon: Christene Lye, MD;  Location: [facility] ORS;  Service: General;  Laterality: Bilateral;  . COLONOSCOPY  2011   [place]  2007  . mass removed  2002  . TONSILLECTOMY    . UPPER GI ENDOSCOPY  2007  . WISDOM TOOTH EXTRACTION  2006    Prior to Admission medications   Medication Sig Start Date End Date Taking? Authorizing Provider  nitrofurantoin,  macrocrystal-monohydrate, (MACROBID) 100 MG capsule Take 1 capsule (100 mg total) by mouth 2 (two) times daily for 5 days. 07/05/17 07/10/17  Rudene Re, MD  oxyCODONE-acetaminophen (ROXICET) 5-325 MG tablet Take 1 tablet by mouth every 6 (six) hours as needed. 07/05/17 07/05/18  Rudene Re, MD    Allergies Patient has no known allergies.  Family History  Problem Relation Age of Onset  . Breast cancer Maternal Aunt 65  . Pancreatic cancer Maternal Grandfather 70    Social History Social History   Tobacco Use  . Smoking status: Never Smoker  . Smokeless tobacco: Never Used  Substance Use Topics  . Alcohol use: Yes    Alcohol/week: 0.0 oz    Comment: Occasionally  . Drug use: No    Review of Systems  Constitutional: Negative for fever. Eyes: Negative for visual changes. ENT: Negative for sore throat. Neck: No neck pain  Cardiovascular: Negative for chest pain. Respiratory: Negative for shortness of breath. Gastrointestinal: + Lower abdominal cramping. No vomiting or diarrhea. Genitourinary: + vaginal bleeding and dysuria Musculoskeletal: Negative for back pain. Skin: Negative for rash. Neurological: Negative for headaches, weakness or numbness. Psych: No SI or HI  ____________________________________________   PHYSICAL EXAM:  VITAL SIGNS: ED Triage Vitals  Enc Vitals Group     BP 07/05/17 0238 139/76     Pulse Rate 07/05/17 0238 85     Resp 07/05/17 0238 16     Temp 07/05/17 0238 99.1 F (37.3 C)     Temp Source 07/05/17 0238 Oral  SpO2 07/05/17 0238 100 %     Weight 07/05/17 0239 230 lb (104.3 kg)     Height 07/05/17 0239 5\' 8"  (1.727 m)     Head Circumference --      Peak Flow --      Pain Score 07/05/17 0237 10     Pain Loc --      Pain Edu? --      Excl. in [place]? --     Constitutional: Alert and oriented. Well appearing and in no apparent distress. HEENT:      Head: Normocephalic and atraumatic.         Eyes: Conjunctivae are  normal. Sclera is non-icteric.       Mouth/Throat: Mucous membranes are moist.       Neck: Supple with no signs of meningismus. Cardiovascular: Regular rate and rhythm. No murmurs, gallops, or rubs. 2+ symmetrical distal pulses are present in all extremities. No JVD. Respiratory: Normal respiratory effort. Lungs are clear to auscultation bilaterally. No wheezes, crackles, or rhonchi.  Gastrointestinal: Soft, non tender, and non distended with positive bowel sounds. No rebound or guarding. Genitourinary: No CVA tenderness. Musculoskeletal: Nontender with normal range of motion in all extremities. No edema, cyanosis, or erythema of extremities. Neurologic: Normal speech and language. Face is symmetric. Moving all extremities. No gross focal neurologic deficits are appreciated. Skin: Skin is warm, dry and intact. No rash noted. Psychiatric: Mood and affect are normal. Speech and behavior are normal.  ____________________________________________   LABS (all labs ordered are listed, but only abnormal results are displayed)  Labs Reviewed  CBC WITH DIFFERENTIAL/PLATELET - Abnormal; Notable for the following components:      Result Value   WBC 12.8 (*)    Hemoglobin 8.8 (*)    HCT 28.9 (*)    MCV 60.4 (*)    MCH 18.3 (*)    MCHC 30.3 (*)    RDW 20.3 (*)    Neutro Abs 10.9 (*)    All other components within normal limits  BASIC METABOLIC PANEL - Abnormal; Notable for the following components:   Sodium 133 (*)    CO2 20 (*)    Glucose, Bld 118 (*)    All other components within normal limits  URINALYSIS, COMPLETE (UACMP) WITH MICROSCOPIC - Abnormal; Notable for the following components:   Color, Urine RED (*)    APPearance   (*)    Value: TEST NOT REPORTED DUE TO COLOR INTERFERENCE OF URINE PIGMENT   Glucose, UA   (*)    Value: TEST NOT REPORTED DUE TO COLOR INTERFERENCE OF URINE PIGMENT   Hgb urine dipstick   (*)    Value: TEST NOT REPORTED DUE TO COLOR INTERFERENCE OF URINE PIGMENT     Bilirubin Urine   (*)    Value: TEST NOT REPORTED DUE TO COLOR INTERFERENCE OF URINE PIGMENT   Ketones, ur   (*)    Value: TEST NOT REPORTED DUE TO COLOR INTERFERENCE OF URINE PIGMENT   Protein, ur   (*)    Value: TEST NOT REPORTED DUE TO COLOR INTERFERENCE OF URINE PIGMENT   Nitrite   (*)    Value: TEST NOT REPORTED DUE TO COLOR INTERFERENCE OF URINE PIGMENT   Leukocytes, UA   (*)    Value: TEST NOT REPORTED DUE TO COLOR INTERFERENCE OF URINE PIGMENT   All other components within normal limits  PREGNANCY, URINE - Abnormal; Notable for the following components:   Preg Test, Ur POSITIVE (*)  All other components within normal limits   ____________________________________________  EKG  none  ____________________________________________  RADIOLOGY  TVUS:  1. Sonographic findings compatible with retained products of conception in the correct clinical setting. 2. Small fibroid at the anterior uterine fundus. ____________________________________________   PROCEDURES  Procedure(s) performed: None Procedures Critical Care performed:  None ____________________________________________   INITIAL IMPRESSION / ASSESSMENT AND PLAN / ED COURSE  36 y.o. Female post elective D&C at [redacted] weeks gestational age who presents for evaluation of abdominal cramping and dysuria. Patient is well-appearing, in no distress, is normal vital signs, abdomen is soft with ttp over the suprapubic region. Labs showing luekocytosis 12.8, hgb 8.8. UA with mo bacteria seen but inconclusive test due to color interference. Since patient is complaining of dysuria I will treat her with macrobid. Patient received fentanyl with some improvement of the pain, will give tylenol 1000mg  and oxycodone 5mg . Will consult obgyn  Clinical Course as of Jul 05 625  Sat Jul 05, 2017  0258 Discussed with Dr. Kenton Kingfisher who recommended [place] and if negative for retained POC to start patient on methergine.  [CV]    Clinical Course  User Index [CV] Rudene Re, MD   _________________________ 6:25 AM on 07/05/2017 -----------------------------------------  [place] concerning for retained POC. Discussed with Dr. Kenton Kingfisher who will admit patient.  As part of my medical decision making, I reviewed the following data within the [place] notes reviewed and incorporated, Labs reviewed , Radiograph reviewed , Discussed with admitting physician , Notes from prior ED visits and [place] Controlled Substance Database    Pertinent labs & imaging results that were available during my care of the patient were reviewed by me and considered in my medical decision making (see chart for details).    ____________________________________________   FINAL CLINICAL IMPRESSION(S) / ED DIAGNOSES  Final diagnoses:  Vaginal bleeding  Retained products of conception following abortion      NEW MEDICATIONS STARTED DURING THIS VISIT:  ED Discharge Orders        Ordered    nitrofurantoin, macrocrystal-monohydrate, (MACROBID) 100 MG capsule  2 times daily     07/05/17 0420    oxyCODONE-acetaminophen (ROXICET) 5-325 MG tablet  Every 6 hours PRN     07/05/17 0420       Note:  This document was prepared using Dragon voice recognition software and may include unintentional dictation errors.    Rudene Re, MD 07/05/17 [phone]

## 2017-07-05 NOTE — Transfer of Care (Signed)
Immediate Anesthesia Transfer of Care Note  Patient: Marie Moran  Procedure(s) Performed: DILATATION AND CURETTAGE (N/A )  Patient Location: PACU  Anesthesia Type:General  Level of Consciousness: awake  Airway & Oxygen Therapy: Patient Spontanous Breathing and Patient connected to face mask oxygen  Post-op Assessment: Report given to RN and Post -op Vital signs reviewed and stable  Post vital signs: Reviewed and stable  Last Vitals:  Vitals:   07/05/17 1410 07/05/17 1415  BP: (!) 154/101   Pulse: 93 86  Resp: 20 (!) 22  Temp: 36.4 C   SpO2: 100% 100%    Last Pain:  Vitals:   07/05/17 1130  TempSrc:   PainSc: 5          Complications: No apparent anesthesia complications

## 2017-07-05 NOTE — Anesthesia Procedure Notes (Signed)
Procedure Name: LMA Insertion Date/Time: 07/05/2017 12:39 PM Performed by: Johnna Acosta, CRNA Pre-anesthesia Checklist: Patient identified, Emergency Drugs available, Suction available, Patient being monitored and Timeout performed Patient Re-evaluated:Patient Re-evaluated prior to induction Oxygen Delivery Method: Circle system utilized Preoxygenation: Pre-oxygenation with 100% oxygen Induction Type: IV induction Ventilation: Mask ventilation without difficulty LMA: LMA inserted LMA Size: 5.0 Tube type: Oral Number of attempts: 1 Placement Confirmation: positive ETCO2 and breath sounds checked- equal and bilateral Tube secured with: Tape Dental Injury: Teeth and Oropharynx as per pre-operative assessment

## 2017-07-05 NOTE — Discharge Instructions (Signed)
AMBULATORY SURGERY  °DISCHARGE INSTRUCTIONS ° ° °1) The drugs that you were given will stay in your system until tomorrow so for the next 24 hours you should not: ° °A) Drive an automobile °B) Make any legal decisions °C) Drink any alcoholic beverage ° ° °2) You may resume regular meals tomorrow.  Today it is better to start with liquids and gradually work up to solid foods. ° °You may eat anything you prefer, but it is better to start with liquids, then soup and crackers, and gradually work up to solid foods. ° ° °3) Please notify your doctor immediately if you have any unusual bleeding, trouble breathing, redness and pain at the surgery site, drainage, fever, or pain not relieved by medication. ° ° ° °4) Additional Instructions: ° ° ° ° ° ° ° °Please contact your physician with any problems or Same Day Surgery at [phone], Monday through Friday 6 am to 4 pm, or [place] [facility] [place] [facility] number at [phone]. °Dilation and Curettage or Vacuum Curettage, Care After °These instructions give you information about caring for yourself after your procedure. Your doctor may also give you more specific instructions. Call your doctor if you have any problems or questions after your procedure. °Follow these instructions at home: °Activity °· Do not drive or use heavy machinery while taking prescription pain medicine. °· For 24 hours after your procedure, avoid driving. °· Take short walks often, followed by rest periods. Ask your doctor what activities are safe for you. After one or two days, you may be able to return to your normal activities. °· Do not lift anything that is heavier than 10 lb (4.5 kg) until your doctor approves. °· For at least 2 weeks, or as long as told by your doctor: °? Do not douche. °? Do not use tampons. °? Do not have sex. °General instructions °· Take over-the-counter and prescription medicines only as told by your doctor. This is very important if you take blood thinning medicine. °· Do  not take baths, swim, or use a hot tub until your doctor approves. Take showers instead of baths. °· Wear compression stockings as told by your doctor. °· It is up to you to get the results of your procedure. Ask your doctor when your results will be ready. °· Keep all follow-up visits as told by your doctor. This is important. °Contact a doctor if: °· You have very bad cramps that get worse or do not get better with medicine. °· You have very bad pain in your belly (abdomen). °· You cannot drink fluids without throwing up (vomiting). °· You get pain in a different part of the area between your belly and thighs (pelvis). °· You have bad-smelling discharge from your vagina. °· You have a rash. °Get help right away if: °· You are bleeding a lot from your vagina. A lot of bleeding means soaking more than one sanitary pad in an hour, for 2 hours in a row. °· You have clumps of blood (blood clots) coming from your vagina. °· You have a fever or chills. °· Your belly feels very tender or hard. °· You have chest pain. °· You have trouble breathing. °· You cough up blood. °· You feel dizzy. °· You feel light-headed. °· You pass out (faint). °· You have pain in your neck or shoulder area. °Summary °· Take short walks often, followed by rest periods. Ask your doctor what activities are safe for you. After one or two days, you   may be able to return to your normal activities. °· Do not lift anything that is heavier than 10 lb (4.5 kg) until your doctor approves. °· Do not take baths, swim, or use a hot tub until your doctor approves. Take showers instead of baths. °· Contact your doctor if you have any symptoms of infection, like bad-smelling discharge from your vagina. °This information is not intended to replace advice given to you by your health care provider. Make sure you discuss any questions you have with your health care provider. °Document Released: 04/09/2008 Document Revised: 03/18/2016 Document Reviewed:  03/18/2016 °Elsevier Interactive Patient Education © 2017 [employer] ° °

## 2017-07-05 NOTE — Op Note (Signed)
Operative Note  07/05/2017  PRE-OP DIAGNOSIS: Retain products of conception  POST-OP DIAGNOSIS: Retained products of conception   SURGEON:Andrienne Havener, MD  PROCEDURE: Procedure(s): DILATATION AND CURETTAGE   ANESTHESIA: IV sedation converted to intubation  ESTIMATED BLOOD LOSS: 500  SPECIMENS: ECC and EMC  COMPLICATIONS: Difficult Intubation  DISPOSITION: PACU - hemodynamically stable.  CONDITION: stable  FINDINGS: Exam under anesthesia revealed small, mobile 12cm uterus with no masses and bilateral adnexa without masses or fullness.   PROCEDURE IN DETAIL: After informed consent was obtained, the patient was taken to the operating room where anesthesia was obtained without difficulty. The patient was positioned in the dorsal lithotomy position in [place]. The patient's bladder was catheterized with an in and out foley catheter. The patient produced about 50cc of clear urine. The patient's urethra did appear to be somewhat stenotic. This is likely related to her sarcoidosis.   The patient was examined under anesthesia, with the above noted findings. The weighted speculum was placed inside the patient's vagina, and the the anterior lip of the cervix was seen and grasped with the ring forcep.  The cervix was progressively dilated to a 18 French-Pratt dilator. A gentle suction curettage of the uterus using the 66mm curved curette. A regular curettage of the uterus then took place. The patient did have brisk bleeding with the curettage. She was given 0.28mcg of methergine IM during the procedure. The procedure was paused so that she could be converted in the middle of the case from IV sedation to intubation. Intubation of the patient was difficult. Per anesthesia the patient has narrowing beyond her vocal cords. Once she was successfully intubated the procedure continued. The patient had a large amount of retained products, suspicious for the patient being further along than  originally reported. The uterine cavity was curetted until a gritty texture was noted, yielding specimen for pathology. Once excellent hemostasis was note, and all instruments were removed. She was then taken out of dorsal lithotomy.  The patient tolerated the procedure well. Sponge, lap and needle counts were correct x2. The patient was taken to recovery room in excellent condition. EBL was determined by the specimen container and weighing the sponges (dry weights subtracted).  Informed the patient's mother and significant other that the procedure had gone well, but gave very limited information because that was the patient's request. 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 They said that she has had difficulty being intubated in the past. She gets short of breath easily and has seen specialist for this before. They reported it was related to her sarcoidosis.  This was not reported to myself or anesthesia preoperatively. Asked family to monitor patient for signs of anemia and bring her back to the hospital if there are concerns.  Adrian Prows, MD [facility], [facility] 07/05/2017  2:24 PM

## 2017-07-05 NOTE — ED Notes (Signed)
OP consent signed by patient and witnessed by this RN

## 2017-07-05 NOTE — ED Notes (Signed)
Dr. Su Grand and Theadora Rama in [place] notified that patient does not want any medical information disclosed in front of her family.  Specifically a female visitor that is present with patient.  PT transported to the OR at this time.

## 2017-07-05 NOTE — ED Notes (Signed)
MD in room talking to patient about surgery.

## 2017-07-05 NOTE — ED Notes (Signed)
Patient transported to Ultrasound at this time. 

## 2017-07-05 NOTE — Anesthesia Postprocedure Evaluation (Signed)
Anesthesia Post Note  Patient: Marie Moran  Procedure(s) Performed: DILATATION AND CURETTAGE (N/A )  Patient location during evaluation: PACU Anesthesia Type: General Level of consciousness: awake and alert and oriented Pain management: pain level controlled Vital Signs Assessment: post-procedure vital signs reviewed and stable Respiratory status: spontaneous breathing Cardiovascular status: blood pressure returned to baseline Anesthetic complications: no Comments: Patient converted to ETT from LMA secondary to some stridor while on LMA.  Noted what appears to be some apparent subglottic stenosis while trying to pass the ETT, still with sats running 100% , but left ETT barely passed cords with ballon behind cords.  Patient denied any issues with airway or anesthetics in the past, but the family postoperatively did say that she indeed had the same problem with prior surgeries.  No stridor in the PACU and patient vocalized appropriately.     Last Vitals:  Vitals:   07/05/17 1440 07/05/17 1445  BP: (!) 145/87   Pulse: 69 68  Resp: 17 16  Temp:    SpO2: 98% 100%    Last Pain:  Vitals:   07/05/17 1445  TempSrc:   PainSc: 5                  Sherron Mummert

## 2017-07-05 NOTE — ED Notes (Signed)
Pt belongings given to female visitor and mother, pt had 2 bags of belongings.

## 2017-07-05 NOTE — H&P (Signed)
Obstetrics & Gynecology History and Physical Note  Date of Consultation: 07/05/2017   Requesting Provider: [facility] ER, Dr Rudene Re  Primary OBGYN: A Copland, PA, at [facility] Provider: McLean-Scocuzza, Nino Glow  Reason for Consultation: Bleeding and pain  History of Present Illness: Marie Moran is a 36 y.o. G65P0020 female presents for evaluation of abdominal pain, some bleeding. Patient reportsthat she underwent a abortion 4 days ago. She was [redacted] weeks pregnant and underwent suction of products of conception. She has been having bleeding like a menstrual period since and pain has been mild; until yesterday morning where she woke up with severe lower abdominal cramping. She reports taking 7 x 800 mg motrin in the last 24 hours with no significant improvement of the pain.  Bleeding is somewhat heavy like a period. She is also complaining of mild dysuria. No nausea or vomiting, no vaginal discharge, no constipation or diarrhea, no chest pain or shortness of breath. Her pain is currently severe, lower pelvis, without radiation, assoc w bleeding but no other sx's; IBF minimally modifes pain, nothing makes it worse.  ROS: A review of systems was performed and was complete and comprehensive, except as stated in the above HPI.  OBGYN History: As per HPI. OB History    Gravida Para Term Preterm AB Living   1         0   SAB TAB Ectopic Multiple Live Births                  Obstetric Comments   1st Menstrual Cycle:  12 1st Pregnancy:  62     Past Medical History: Past Medical History:  Diagnosis Date  . Fibroadenoma of both breasts   . Pre-diabetes   . Sarcoidosis    2009  . Vitamin D deficiency     Past Surgical History: Past Surgical History:  Procedure Laterality Date  . BREAST BIOPSY Right neg   2002 Dr. Larinda Buttery   . BREAST BIOPSY Bilateral 03/06/2015   Procedure: BILATERAL BREAST MASS EXCISION, right breast mass excision with ultrasound guided needle  localization ;  Surgeon: Christene Lye, MD;  Location: [facility] ORS;  Service: General;  Laterality: Bilateral;  . COLONOSCOPY  2011   [place]  2007  . mass removed  2002  . TONSILLECTOMY    . UPPER GI ENDOSCOPY  2007  . WISDOM TOOTH EXTRACTION  2006   Family History:  Family History  Problem Relation Age of Onset  . Breast cancer Maternal Aunt 65  . Pancreatic cancer Maternal Grandfather 48   She denies any other female cancers, bleeding or blood clotting disorders.   Social History:  Social History   Socioeconomic History  . Marital status: Married    Spouse name: Not on file  . Number of children: Not on file  . Years of education: Not on file  . Highest education level: Not on file  Social Needs  . Financial resource strain: Not on file  . Food insecurity - worry: Not on file  . Food insecurity - inability: Not on file  . Transportation needs - medical: Not on file  . Transportation needs - non-medical: Not on file  Occupational History  . Not on file  Tobacco Use  . Smoking status: Never Smoker  . Smokeless tobacco: Never Used  Substance and Sexual Activity  . Alcohol use: Yes    Alcohol/week: 0.0 oz    Comment: Occasionally  .  Drug use: No  . Sexual activity: Yes    Birth control/protection: None  Other Topics Concern  . Not on file  Social History Narrative  . Not on file   Allergy: No Known Allergies  Current Outpatient Medications: No current facility-administered medications for this encounter.   Current Outpatient Medications:  .  nitrofurantoin, macrocrystal-monohydrate, (MACROBID) 100 MG capsule, Take 1 capsule (100 mg total) by mouth 2 (two) times daily for 5 days., Disp: 10 capsule, Rfl: 0 .  oxyCODONE-acetaminophen (ROXICET) 5-325 MG tablet, Take 1 tablet by mouth every 6 (six) hours as needed., Disp: 8 tablet, Rfl: 0  Physical Exam: Vitals:   07/05/17 0335 07/05/17 0411 07/05/17 0537  07/05/17 0600  BP: (!) 145/77 126/82 (!) 152/93 (!) 145/85  Pulse: 85 85 75 76  Resp: 17 17    Temp:      TempSrc:      SpO2: 94% 100% 100% 97%  Weight:      Height:        Temp:  [99.1 F (37.3 C)] 99.1 F (37.3 C) (12/22 0238) Pulse Rate:  [75-85] 76 (12/22 0600) Resp:  [16-17] 17 (12/22 0411) BP: (126-152)/(76-93) 145/85 (12/22 0600) SpO2:  [94 %-100 %] 97 % (12/22 0600) Weight:  [230 lb (104.3 kg)] 230 lb (104.3 kg) (12/22 0239) No intake/output data recorded. No intake/output data recorded. No intake or output data in the 24 hours ending 07/05/17 0636  Body mass index is 34.97 kg/m. Constitutional: Well nourished, well developed female in no acute distress.  HEENT: normal Neck:  Supple, normal appearance, and no thyromegaly  Cardiovascular:Regular rate and rhythm.  No murmurs, rubs or gallops. Respiratory:  Clear to auscultation bilateral. Normal respiratory effort Abdomen: positive bowel sounds and no masses, hernias; diffusely non tender to palpation, non distended Neuro: grossly intact Psych:  Normal mood and affect.  Skin:  Warm and dry.  MS: normal gait and normal bilateral lower extremity strength/ROM/symmetry Lymphatic:  No inguinal lymphadenopathy.   Recent Labs  Lab 07/05/17 0255  WBC 12.8*  HGB 8.8*  HCT 28.9*  PLT 412   Recent Labs  Lab 07/05/17 0255  NA 133*  K 3.9  CL 104  CO2 20*  BUN 8  CREATININE 0.63  CALCIUM 9.1  GLUCOSE 118*   Imaging:  Ultrasound independently reviewed/interpreted by self.  CLINICAL DATA:  Three status post dilation and curettage 4 days ago. Severe pelvic cramping. ) EXAM: TRANSABDOMINAL AND TRANSVAGINAL ULTRASOUND OF PELVIS TECHNIQUE: Both transabdominal and transvaginal ultrasound examinations of the pelvis were performed. Transabdominal technique was performed for global imaging of the pelvis including uterus, ovaries, adnexal regions, and pelvic cul-de-sac. It was necessary to proceed with endovaginal  exam following the transabdominal exam to visualize the uterus and ovaries. COMPARISON:  None FINDINGS: Uterus Measurements: 11.8 x 6.4 x 7.7 cm. There is an anterior fundal fibroid measuring 2.7 x 2.3 x 3.3 cm. Endometrium-Thickness: 33 mm. There is vascular material within the endometrial cavity. Right ovary  Not visualized No adnexal mass. Left ovary   Not visualized.  No adnexal mass. Other findings No abnormal free fluid. There is a debris containing nabothian cyst measuring up 1.8 x 1.4 cm.  IMPRESSION: 1. Sonographic findings compatible with retained products of conception in the correct clinical setting. 2. Small fibroid at the anterior uterine fundus.  Assessment: Ms. Arista is a 36 y.o. G1P0  who presented to the ED with complaints of Abdominal pain, also bleeding; findings are consistent with Incomplete Abortion.  Also,  anemia.  Plan: Risks and benefits of surgery vs medical management of incomplete abortion, as evidenced by pain and ultrasound findings, d/w pt.  Prefers surgery as definitive therapy and to quickly help her pain.  Hemodynamically stable so will add on for today Suction D&C; risks and recovery counseled to pt.  I have had a careful discussion with this patient about all the options available and the risk/benefits of each. I have fully informed this patient that surgery may subject her to a variety of discomforts and risks: She understands that most patients have surgery with little difficulty, but problems can happen ranging from minor to fatal. These include nausea, vomiting, pain, bleeding, infection, poor healing, hernia, or formation of adhesions. Unexpected reactions may occur from any drug or anesthetic given. Unintended injury may occur to other pelvic or abdominal structures such as Fallopian tubes, ovaries, bladder, ureter (tube from kidney to bladder), or bowel.  Any such injury may require immediate or later additional surgery to correct the problem.  Excessive blood loss requiring transfusion is very unlikely but possible. Dangerous blood clots may form in the legs or lungs. Physical and sexual activity will be restricted in varying degrees for an indeterminate period of time but most often 2-6 weeks.  Finally, she understands that it is impossible to list every possible undesirable effect and that the condition for which surgery is done is not always cured or significantly improved, and in rare cases may be even worse.Ample time was given to answer all questions.  Barnett Applebaum, MD, [facility], [facility] 07/05/2017  6:36 AM Pager [phone]

## 2017-07-05 NOTE — ED Triage Notes (Signed)
Pt presents to ER with complaints of lower abdominal pain. Pt reports she terminated a pregnancy on Tuesday, reports today she developed severe lower abdominal pain, reports vaginal  Bleeding normal since Tuesday no changes , reports no other symptoms. Pt talks in complete sentences no distress noted

## 2017-07-06 ENCOUNTER — Encounter: Payer: Self-pay | Admitting: Obstetrics and Gynecology

## 2017-07-06 LAB — TYPE AND SCREEN
ABO/RH(D): B POS
ANTIBODY SCREEN: NEGATIVE
UNIT DIVISION: 0
Unit division: 0

## 2017-07-06 LAB — BPAM RBC
BLOOD PRODUCT EXPIRATION DATE: 201901132359
BLOOD PRODUCT EXPIRATION DATE: 201901132359
UNIT TYPE AND RH: 7300
UNIT TYPE AND RH: 7300

## 2017-07-06 LAB — PREPARE RBC (CROSSMATCH)

## 2017-07-09 LAB — SURGICAL PATHOLOGY

## 2017-07-16 ENCOUNTER — Ambulatory Visit: Payer: Self-pay | Admitting: Obstetrics and Gynecology

## 2017-07-22 ENCOUNTER — Ambulatory Visit: Payer: Self-pay | Admitting: Obstetrics and Gynecology

## 2017-10-09 ENCOUNTER — Ambulatory Visit: Payer: PRIVATE HEALTH INSURANCE | Admitting: Obstetrics and Gynecology

## 2017-10-17 ENCOUNTER — Encounter: Payer: Self-pay | Admitting: Obstetrics and Gynecology

## 2017-10-17 ENCOUNTER — Ambulatory Visit (INDEPENDENT_AMBULATORY_CARE_PROVIDER_SITE_OTHER): Payer: BLUE CROSS/BLUE SHIELD | Admitting: Obstetrics and Gynecology

## 2017-10-17 VITALS — BP 126/72 | Ht 68.0 in | Wt 235.0 lb

## 2017-10-17 DIAGNOSIS — N946 Dysmenorrhea, unspecified: Secondary | ICD-10-CM

## 2017-10-17 DIAGNOSIS — Z3009 Encounter for other general counseling and advice on contraception: Secondary | ICD-10-CM

## 2017-10-17 DIAGNOSIS — N920 Excessive and frequent menstruation with regular cycle: Secondary | ICD-10-CM

## 2017-10-17 DIAGNOSIS — Z9889 Other specified postprocedural states: Secondary | ICD-10-CM

## 2017-10-17 MED ORDER — ETONOGESTREL-ETHINYL ESTRADIOL 0.12-0.015 MG/24HR VA RING
VAGINAL_RING | VAGINAL | 11 refills | Status: DC
Start: 2017-10-17 — End: 2018-07-22

## 2017-10-17 MED ORDER — NAPROXEN 500 MG PO TABS: 500.0000 mg | ORAL_TABLET | Freq: Two times a day (BID) | ORAL | 11 refills | Status: DC

## 2017-10-17 NOTE — Progress Notes (Signed)
  Postoperative Follow-up Patient presents post op from a suction D&C for retained products of conception, 4 months ago ago.  Subjective: Patient reports that since the procedure she has had heavier monthly periods with cramping.   She says that she is having regular monthly menses that last for 7 days. She is soaking pads which she changes every 2 hours. She is having menstrual cramping that is more painful than usual. She has had similar symptoms before and her pain was controlled on the nuvaring. She would like to restart the nuvaring. She does report improvement in her preop symptoms. She is eating a regular diet without difficulty. Activity: normal activities of daily living. Patient reports vaginal sx's of Excessive bleeding  Objective: BP 126/72 (BP Location: Left Arm, Patient Position: Sitting, Cuff Size: Normal)   Ht 5\' 8"  (1.727 m)   Wt 235 lb (106.6 kg)   LMP 10/13/2017   BMI 35.73 kg/m    Assessment: s/p :  Suction Dilation and curettage for retained products of conception,  stable Physical Exam  Constitutional: She is oriented to person, place, and time and well-developed, well-nourished, and in no distress.  HENT:  Head: Normocephalic and atraumatic.  Eyes: EOM are normal.  Neck: Neck supple.  Cardiovascular: Normal rate.  Pulmonary/Chest: Effort normal.  Abdominal: Soft.  Musculoskeletal: She exhibits no deformity.  Neurological: She is alert and oriented to person, place, and time.  Skin: Skin is warm.  Psychiatric: Memory, affect and judgment normal.    Plan: Patient has done well after surgery with no apparent complications.  I have discussed the post-operative course to date, and the expected progress moving forward.  The patient understands what complications to be concerned about.  I will see the patient in routine follow up, or sooner if needed.    Activity plan: No restriction.  Discussed birth control options for management of menorrhagia and dysmenorrhea  with patient. She was given a handout and told to visit [email or web address] for mor information. Will send prescription for Nuvaring and Naproxen.  15 minutes spent with patient, more than 50% of that was spend counseling.   Return in 2 weeks   [place] 10/17/2017, 10:28 AM

## 2017-10-20 ENCOUNTER — Ambulatory Visit: Payer: PRIVATE HEALTH INSURANCE | Admitting: Obstetrics and Gynecology

## 2017-10-21 DIAGNOSIS — H209 Unspecified iridocyclitis: Secondary | ICD-10-CM | POA: Diagnosis not present

## 2017-10-21 DIAGNOSIS — D8683 Sarcoid iridocyclitis: Secondary | ICD-10-CM | POA: Diagnosis not present

## 2017-10-21 DIAGNOSIS — E668 Other obesity: Secondary | ICD-10-CM | POA: Diagnosis not present

## 2017-10-30 ENCOUNTER — Telehealth: Payer: Self-pay

## 2017-10-30 NOTE — Telephone Encounter (Signed)
Pt has started nuvaring, has had light spotting for the last week.  Is this normal?  [phone]  Adv it take the body a good 24m to adjust to birthcontrol.  To give it a good three months and it should even itself out.  Pt reassured.

## 2018-01-16 ENCOUNTER — Ambulatory Visit: Payer: BLUE CROSS/BLUE SHIELD | Admitting: Obstetrics and Gynecology

## 2018-03-19 DIAGNOSIS — H43811 Vitreous degeneration, right eye: Secondary | ICD-10-CM | POA: Diagnosis not present

## 2018-03-31 DIAGNOSIS — H4311 Vitreous hemorrhage, right eye: Secondary | ICD-10-CM | POA: Diagnosis not present

## 2018-04-06 ENCOUNTER — Ambulatory Visit
Admission: RE | Admit: 2018-04-06 | Discharge: 2018-04-06 | Disposition: A | Discharge status: Home / Self Care | Payer: BLUE CROSS/BLUE SHIELD | Source: Ambulatory Visit | Attending: Ophthalmology | Admitting: Ophthalmology

## 2018-04-06 ENCOUNTER — Other Ambulatory Visit: Payer: Self-pay | Admitting: *Deleted

## 2018-04-06 ENCOUNTER — Other Ambulatory Visit
Admission: RE | Admit: 2018-04-06 | Discharge: 2018-04-06 | Disposition: A | Discharge status: Home / Self Care | Payer: BLUE CROSS/BLUE SHIELD | Source: Ambulatory Visit | Attending: *Deleted | Admitting: *Deleted

## 2018-04-06 ENCOUNTER — Other Ambulatory Visit: Payer: Self-pay | Admitting: Ophthalmology

## 2018-04-06 DIAGNOSIS — D869 Sarcoidosis, unspecified: Secondary | ICD-10-CM

## 2018-04-06 DIAGNOSIS — D8689 Sarcoidosis of other sites: Secondary | ICD-10-CM | POA: Diagnosis not present

## 2018-04-06 DIAGNOSIS — H3093 Unspecified chorioretinal inflammation, bilateral: Secondary | ICD-10-CM | POA: Diagnosis not present

## 2018-04-07 LAB — MISC LABCORP TEST (SEND OUT)
Labcorp test code: 6478
Labcorp test code: 96651

## 2018-04-07 LAB — TOXOPLASMA GONDII ANTIBODY, IGM: Toxoplasma Antibody- IgM: <3 AU/mL (ref 0.0–7.9)

## 2018-04-07 LAB — ANGIOTENSIN CONVERTING ENZYME: Angiotensin-Converting Enzyme: 53 U/L (ref 14–82)

## 2018-04-07 LAB — RPR: RPR Ser Ql: NONREACTIVE

## 2018-04-07 LAB — INFECT DISEASE AB IGM REFLEX 1

## 2018-04-08 DIAGNOSIS — H209 Unspecified iridocyclitis: Secondary | ICD-10-CM | POA: Diagnosis not present

## 2018-04-08 DIAGNOSIS — D869 Sarcoidosis, unspecified: Secondary | ICD-10-CM | POA: Diagnosis not present

## 2018-04-08 LAB — QUANTIFERON-TB GOLD PLUS (RQFGPL)
QUANTIFERON NIL VALUE: 0.02 [IU]/mL
QUANTIFERON TB1 AG VALUE: 0.02 [IU]/mL
QuantiFERON Mitogen Value: 3.03 IU/mL
QuantiFERON TB2 Ag Value: 0.02 IU/mL

## 2018-04-08 LAB — QUANTIFERON-TB GOLD PLUS: QUANTIFERON-TB GOLD PLUS: NEGATIVE

## 2018-04-15 DIAGNOSIS — Z79899 Other long term (current) drug therapy: Secondary | ICD-10-CM | POA: Diagnosis not present

## 2018-04-15 DIAGNOSIS — H209 Unspecified iridocyclitis: Secondary | ICD-10-CM | POA: Diagnosis not present

## 2018-05-27 DIAGNOSIS — H3093 Unspecified chorioretinal inflammation, bilateral: Secondary | ICD-10-CM | POA: Diagnosis not present

## 2018-07-22 ENCOUNTER — Encounter: Payer: Self-pay | Admitting: Obstetrics and Gynecology

## 2018-07-22 ENCOUNTER — Other Ambulatory Visit (HOSPITAL_COMMUNITY)
Admission: RE | Admit: 2018-07-22 | Discharge: 2018-07-22 | Disposition: A | Discharge status: Home / Self Care | Payer: BLUE CROSS/BLUE SHIELD | Source: Ambulatory Visit | Attending: Obstetrics and Gynecology | Admitting: Obstetrics and Gynecology

## 2018-07-22 ENCOUNTER — Ambulatory Visit (INDEPENDENT_AMBULATORY_CARE_PROVIDER_SITE_OTHER): Payer: BLUE CROSS/BLUE SHIELD | Admitting: Obstetrics and Gynecology

## 2018-07-22 VITALS — BP 130/90 | Ht 68.0 in | Wt 224.0 lb

## 2018-07-22 DIAGNOSIS — Z01419 Encounter for gynecological examination (general) (routine) without abnormal findings: Secondary | ICD-10-CM | POA: Diagnosis not present

## 2018-07-22 DIAGNOSIS — Z1151 Encounter for screening for human papillomavirus (HPV): Secondary | ICD-10-CM

## 2018-07-22 DIAGNOSIS — E559 Vitamin D deficiency, unspecified: Secondary | ICD-10-CM | POA: Insufficient documentation

## 2018-07-22 DIAGNOSIS — Z3044 Encounter for surveillance of vaginal ring hormonal contraceptive device: Secondary | ICD-10-CM

## 2018-07-22 DIAGNOSIS — Z124 Encounter for screening for malignant neoplasm of cervix: Secondary | ICD-10-CM

## 2018-07-22 MED ORDER — ETONOGESTREL-ETHINYL ESTRADIOL 0.12-0.015 MG/24HR VA RING: VAGINAL_RING | VAGINAL | 3 refills | Status: DC

## 2018-07-22 NOTE — Progress Notes (Signed)
PCP:  McLean-Scocuzza, Nino Glow, MD   Chief Complaint  Patient presents with  . Gynecologic Exam    about 2 months ago started noticing pelvic cramping on the first week of Nuvaring insertion     HPI:      Ms. MELISSSA Moran is a 38 y.o. G1P0 who LMP was Patient's last menstrual period was 06/28/2018 (exact date)., presents today for her annual examination.  Her menses are regular every 28-30 days, lasting 5-6 days with nuvaring.  Dysmenorrhea mild, occurring first 1-2 days of flow, and recently first wk of nuvaring restart. Sx relieved with NSAIDs. She does not have intermenstrual bleeding.  Sex activity: single partner, contraception-nuvaring. Wanted to conceive last yr, S/P SAB 12/18. Has been on steroids and methotrexate in the past which wasn't safe to use with conception and now on them again for sarcoidosis.   Last Pap: March 30, 2015 and 04/15/17 Results were: no abnormalities /POS HPV DNA. Colpo and bx 10/18 were neg. Repeat pap due today. Hx of STDs: HPV  Last mammogram: with Dr. Jamal Collin in past due to bilat fibroadenomas removed. No recent breast masses.  There is a FH of breast cancer in her mat great aunt, genetic testing not indicated . There is no FH of ovarian cancer. The patient does do self-breast exams. She has a hx of bilat fibroadenomas removed with Dr. Jamal Collin in 8/16.  Tobacco use: The patient denies current or previous tobacco use. Alcohol use: social drinker No drug use.  Exercise: moderately active  She does get adequate calcium and not Vitamin D in her diet.  10/18 labs with Vit D deficiency, pre-DM resolved. Fasting labs 9/16 with pre-DM (most likely due to steroid use but not rechecked), normal lipids. PCP managing labs now.  Past Medical History:  Diagnosis Date  . Fibroadenoma of both breasts   . Pre-diabetes   . Sarcoidosis    2009  . Vitamin D deficiency     Past Surgical History:  Procedure Laterality Date  . BREAST BIOPSY Right neg   2002 Dr. Larinda Buttery   . BREAST BIOPSY Bilateral 03/06/2015   Procedure: BILATERAL BREAST MASS EXCISION, right breast mass excision with ultrasound guided needle localization ;  Surgeon: Christene Lye, MD;  Location: [facility] ORS;  Service: General;  Laterality: Bilateral;  . COLONOSCOPY  2011   [place]  2007  . DILATION AND CURETTAGE OF UTERUS N/A 07/05/2017   Procedure: DILATATION AND CURETTAGE;  Surgeon: Homero Fellers, MD;  Location: [facility] ORS;  Service: Gynecology;  Laterality: N/A;  . mass removed  2002  . TONSILLECTOMY    . UPPER GI ENDOSCOPY  2007  . WISDOM TOOTH EXTRACTION  2006    Family History  Problem Relation Age of Onset  . Breast cancer Maternal Aunt 65  . Pancreatic cancer Maternal Grandfather 56  . Heart disease Maternal Grandmother   . Hypertension Maternal Grandmother     Social History   Socioeconomic History  . Marital status: Married    Spouse name: Not on file  . Number of children: Not on file  . Years of education: Not on file  . Highest education level: Not on file  Occupational History  . Not on file  Social Needs  . Financial resource strain: Not on file  . Food insecurity:    Worry: Not on file    Inability: Not on file  . Transportation needs:    Medical: Not  on file    Non-medical: Not on file  Tobacco Use  . Smoking status: Never Smoker  . Smokeless tobacco: Never Used  Substance and Sexual Activity  . Alcohol use: Yes    Alcohol/week: 0.0 standard drinks    Comment: Occasionally  . Drug use: No  . Sexual activity: Yes    Birth control/protection: Inserts    Comment: Nuvaring  Lifestyle  . Physical activity:    Days per week: Not on file    Minutes per session: Not on file  . Stress: Not on file  Relationships  . Social connections:    Talks on phone: Not on file    Gets together: Not on file    Attends religious service: Not on file    Active member of club or organization:  Not on file    Attends meetings of clubs or organizations: Not on file    Relationship status: Not on file  . Intimate partner violence:    Fear of current or ex partner: Not on file    Emotionally abused: Not on file    Physically abused: Not on file    Forced sexual activity: Not on file  Other Topics Concern  . Not on file  Social History Narrative  . Not on file    Current Meds  Medication Sig  . Ascorbic Acid (VITAMIN C) 100 MG tablet Take 100 mg by mouth daily.  . cholecalciferol (VITAMIN D) 1000 units tablet Take 1,000 Units by mouth daily.  Marland Kitchen etonogestrel-ethinyl estradiol (NUVARING) 0.12-0.015 MG/24HR vaginal ring Insert vaginally and leave in place for 3 consecutive weeks, then remove for 1 week.  . ferrous sulfate 325 (65 FE) MG EC tablet Take 325 mg by mouth 3 (three) times daily with meals.  . folic acid (FOLVITE) 1 MG tablet 1 tab daily, 90 days  . ibuprofen (ADVIL,MOTRIN) 600 MG tablet Take by mouth.  . methotrexate (RHEUMATREX) 2.5 MG tablet 6 tabs weekly, 12 weeks  . prednisoLONE acetate (PRED FORTE) 1 % ophthalmic suspension   . [DISCONTINUED] etonogestrel-ethinyl estradiol (NUVARING) 0.12-0.015 MG/24HR vaginal ring Insert vaginally and leave in place for 3 consecutive weeks, then remove for 1 week.     ROS:  Review of Systems  Constitutional: Negative for fatigue, fever and unexpected weight change.  Respiratory: Negative for cough, shortness of breath and wheezing.   Cardiovascular: Negative for chest pain, palpitations and leg swelling.  Gastrointestinal: Negative for blood in stool, constipation, diarrhea, nausea and vomiting.  Endocrine: Negative for cold intolerance, heat intolerance and polyuria.  Genitourinary: Negative for dyspareunia, dysuria, flank pain, frequency, genital sores, hematuria, menstrual problem, pelvic pain, urgency, vaginal bleeding, vaginal discharge and vaginal pain.  Musculoskeletal: Negative for back pain, joint swelling and  myalgias.  Skin: Negative for rash.  Neurological: Negative for dizziness, syncope, light-headedness, numbness and headaches.  Hematological: Negative for adenopathy.  Psychiatric/Behavioral: Negative for agitation, confusion, sleep disturbance and suicidal ideas. The patient is not nervous/anxious.      Objective: BP 130/90   Ht 5\' 8"  (1.727 m)   Wt 224 lb (101.6 kg)   LMP 06/28/2018 (Exact Date)   BMI 34.06 kg/m    Physical Exam Constitutional:      Appearance: She is well-developed.  Genitourinary:     Vulva, vagina, uterus, right adnexa and left adnexa normal.     No vulval lesion or tenderness noted.     No vaginal discharge, erythema or tenderness.     No cervical motion tenderness or  polyp.     Uterus is not enlarged or tender.     No right or left adnexal mass present.     Right adnexa not tender.     Left adnexa not tender.  Neck:     Musculoskeletal: Normal range of motion.     Thyroid: No thyromegaly.  Cardiovascular:     Rate and Rhythm: Normal rate and regular rhythm.     Heart sounds: Normal heart sounds. No murmur.  Pulmonary:     Effort: Pulmonary effort is normal.     Breath sounds: Normal breath sounds.  Chest:     Breasts:        Right: No mass, nipple discharge, skin change or tenderness.        Left: No mass, nipple discharge, skin change or tenderness.  Abdominal:     Palpations: Abdomen is soft.     Tenderness: There is no abdominal tenderness. There is no guarding.  Musculoskeletal: Normal range of motion.  Neurological:     Mental Status: She is alert and oriented to person, place, and time.     Cranial Nerves: No cranial nerve deficit.  Psychiatric:        Behavior: Behavior normal.  Vitals signs reviewed.     Assessment/Plan: Encounter for annual routine gynecological examination  Cervical cancer screening - Plan: Cytology - PAP  Screening for HPV (human papillomavirus) - Plan: Cytology - PAP  Encounter for surveillance of  vaginal ring hormonal contraceptive device - Nuvaring RF. Cramping with insertion mild, neg exam today. Reassurance. F/u prn. - Plan: etonogestrel-ethinyl estradiol (NUVARING) 0.12-0.015 MG/24HR vaginal ring  Vitamin D deficiency - Pt taking supp.      Meds ordered this encounter  Medications  . etonogestrel-ethinyl estradiol (NUVARING) 0.12-0.015 MG/24HR vaginal ring    Sig: Insert vaginally and leave in place for 3 consecutive weeks, then remove for 1 week.    Dispense:  3 each    Refill:  3    Order Specific Question:   Supervising Provider    Answer:   Gae Dry [[id number]]          GYN counsel breast self exam, adequate intake of calcium and vitamin D, diet and exercise     F/U  Return in about 1 year (around 07/23/2019).  Alicia B. Copland, [name] 07/22/2018 3:51 PM

## 2018-07-22 NOTE — Patient Instructions (Signed)
I value your feedback and entrusting us with your care. If you get a [facility] patient survey, I would appreciate you taking the time to let us know about your experience today. Thank you! 

## 2018-07-27 LAB — CYTOLOGY - PAP
ADEQUACY: ABSENT
Diagnosis: NEGATIVE
HPV: DETECTED — AB

## 2018-07-28 ENCOUNTER — Telehealth: Payer: Self-pay | Admitting: Obstetrics and Gynecology

## 2018-07-28 NOTE — Telephone Encounter (Signed)
LMTRC

## 2018-07-28 NOTE — Telephone Encounter (Signed)
Patient returning ABC call, same cb

## 2018-08-05 DIAGNOSIS — H209 Unspecified iridocyclitis: Secondary | ICD-10-CM | POA: Diagnosis not present

## 2018-08-05 DIAGNOSIS — L659 Nonscarring hair loss, unspecified: Secondary | ICD-10-CM | POA: Diagnosis not present

## 2018-08-05 DIAGNOSIS — Z79899 Other long term (current) drug therapy: Secondary | ICD-10-CM | POA: Diagnosis not present

## 2018-08-05 DIAGNOSIS — D869 Sarcoidosis, unspecified: Secondary | ICD-10-CM | POA: Diagnosis not present

## 2018-08-13 ENCOUNTER — Other Ambulatory Visit (HOSPITAL_COMMUNITY)
Admission: RE | Admit: 2018-08-13 | Discharge: 2018-08-13 | Disposition: A | Discharge status: Home / Self Care | Payer: BLUE CROSS/BLUE SHIELD | Source: Ambulatory Visit | Attending: Obstetrics and Gynecology | Admitting: Obstetrics and Gynecology

## 2018-08-13 ENCOUNTER — Encounter: Payer: Self-pay | Admitting: Obstetrics and Gynecology

## 2018-08-13 ENCOUNTER — Ambulatory Visit (INDEPENDENT_AMBULATORY_CARE_PROVIDER_SITE_OTHER): Payer: BLUE CROSS/BLUE SHIELD | Admitting: Obstetrics and Gynecology

## 2018-08-13 VITALS — BP 132/70 | Ht 68.0 in | Wt 223.0 lb

## 2018-08-13 DIAGNOSIS — N72 Inflammatory disease of cervix uteri: Secondary | ICD-10-CM | POA: Insufficient documentation

## 2018-08-13 DIAGNOSIS — B977 Papillomavirus as the cause of diseases classified elsewhere: Secondary | ICD-10-CM

## 2018-08-13 NOTE — Progress Notes (Signed)
   GYNECOLOGY CLINIC COLPOSCOPY PROCEDURE NOTE  38 y.o. [id number] here for colposcopy for NIL and HR HPV+  pap smear on 07/22/2018. Discussed underlying role for HPV infection in the development of cervical dysplasia, its natural history and progression/regression, need for surveillance.  Is the patient  pregnant: No LMP: Patient's last menstrual period was 08/03/2018 (exact date). Smoking status:  reports that she has never smoked. She has never used smokeless tobacco. Contraception: NuvaRing vaginal inserts  Patient given informed consent, signed copy in the chart, time out was performed.  The patient was position in dorsal lithotomy position. Speculum was placed the cervix was visualized.   After application of acetic acid colposcopic inspection of the cervix was undertaken.   Colposcopy adequate, full visualization of transformation zone: Yes Mild acetowhite lesion noted at 9 o'clock; corresponding biopsies obtained.   ECC specimen obtained:  Yes  All specimens were labeled and sent to pathology.   Patient was given post procedure instructions.  Will follow up pathology and manage accordingly.  Routine preventative health maintenance measures emphasized.  OBGyn Exam  Malachy Mood, MD, [facility], [facility]

## 2018-08-18 ENCOUNTER — Telehealth: Payer: Self-pay | Admitting: Obstetrics and Gynecology

## 2018-08-18 NOTE — Telephone Encounter (Signed)
Patient says she missed a call yesterday but no message was left, she had colpo back in January and thought maybe it was results?

## 2018-08-18 NOTE — Telephone Encounter (Signed)
Pt called back about missed phone call. She has advised that she is at work until [place] and will not be able to answer the phone so she said it is ok to leave a voice mail with the results if possible.

## 2018-08-18 NOTE — Telephone Encounter (Signed)
I did not call patient. Advise

## 2018-08-19 NOTE — Telephone Encounter (Signed)
Patient is calling to speak with Dr. Georgianne Fick about her Colpo Results. Please advise .Patient available all day

## 2019-01-08 DIAGNOSIS — E668 Other obesity: Secondary | ICD-10-CM | POA: Diagnosis not present

## 2019-01-08 DIAGNOSIS — E669 Obesity, unspecified: Secondary | ICD-10-CM | POA: Diagnosis not present

## 2019-01-08 DIAGNOSIS — R030 Elevated blood-pressure reading, without diagnosis of hypertension: Secondary | ICD-10-CM | POA: Diagnosis not present

## 2019-01-08 DIAGNOSIS — Z131 Encounter for screening for diabetes mellitus: Secondary | ICD-10-CM | POA: Diagnosis not present

## 2019-01-08 DIAGNOSIS — Z13220 Encounter for screening for lipoid disorders: Secondary | ICD-10-CM | POA: Diagnosis not present

## 2019-01-08 DIAGNOSIS — R799 Abnormal finding of blood chemistry, unspecified: Secondary | ICD-10-CM | POA: Diagnosis not present

## 2019-01-08 DIAGNOSIS — R5383 Other fatigue: Secondary | ICD-10-CM | POA: Diagnosis not present

## 2019-01-20 DIAGNOSIS — E6609 Other obesity due to excess calories: Secondary | ICD-10-CM | POA: Diagnosis not present

## 2019-01-20 DIAGNOSIS — E559 Vitamin D deficiency, unspecified: Secondary | ICD-10-CM | POA: Diagnosis not present

## 2019-01-20 DIAGNOSIS — E611 Iron deficiency: Secondary | ICD-10-CM | POA: Diagnosis not present

## 2019-01-20 DIAGNOSIS — K219 Gastro-esophageal reflux disease without esophagitis: Secondary | ICD-10-CM | POA: Diagnosis not present

## 2019-01-28 DIAGNOSIS — H35351 Cystoid macular degeneration, right eye: Secondary | ICD-10-CM | POA: Diagnosis not present

## 2019-01-28 DIAGNOSIS — H35063 Retinal vasculitis, bilateral: Secondary | ICD-10-CM | POA: Diagnosis not present

## 2019-01-28 DIAGNOSIS — H43811 Vitreous degeneration, right eye: Secondary | ICD-10-CM | POA: Diagnosis not present

## 2019-01-28 DIAGNOSIS — D8689 Sarcoidosis of other sites: Secondary | ICD-10-CM | POA: Diagnosis not present

## 2019-02-03 DIAGNOSIS — Z79899 Other long term (current) drug therapy: Secondary | ICD-10-CM | POA: Diagnosis not present

## 2019-02-03 DIAGNOSIS — H209 Unspecified iridocyclitis: Secondary | ICD-10-CM | POA: Diagnosis not present

## 2019-02-03 DIAGNOSIS — D869 Sarcoidosis, unspecified: Secondary | ICD-10-CM | POA: Diagnosis not present

## 2019-02-10 DIAGNOSIS — D869 Sarcoidosis, unspecified: Secondary | ICD-10-CM | POA: Diagnosis not present

## 2019-02-10 DIAGNOSIS — E6609 Other obesity due to excess calories: Secondary | ICD-10-CM | POA: Diagnosis not present

## 2019-02-10 DIAGNOSIS — Z23 Encounter for immunization: Secondary | ICD-10-CM | POA: Diagnosis not present

## 2019-02-10 DIAGNOSIS — K219 Gastro-esophageal reflux disease without esophagitis: Secondary | ICD-10-CM | POA: Diagnosis not present

## 2019-03-24 DIAGNOSIS — E663 Overweight: Secondary | ICD-10-CM | POA: Diagnosis not present

## 2019-03-24 DIAGNOSIS — H209 Unspecified iridocyclitis: Secondary | ICD-10-CM | POA: Diagnosis not present

## 2019-03-24 DIAGNOSIS — Z23 Encounter for immunization: Secondary | ICD-10-CM | POA: Diagnosis not present

## 2019-03-24 DIAGNOSIS — D869 Sarcoidosis, unspecified: Secondary | ICD-10-CM | POA: Diagnosis not present

## 2019-06-30 DIAGNOSIS — H209 Unspecified iridocyclitis: Secondary | ICD-10-CM | POA: Diagnosis not present

## 2019-06-30 DIAGNOSIS — Z79899 Other long term (current) drug therapy: Secondary | ICD-10-CM | POA: Diagnosis not present

## 2019-06-30 DIAGNOSIS — D869 Sarcoidosis, unspecified: Secondary | ICD-10-CM | POA: Diagnosis not present

## 2019-07-16 HISTORY — PX: BREAST BIOPSY: SHX20

## 2019-07-28 ENCOUNTER — Other Ambulatory Visit: Payer: Self-pay

## 2019-07-28 ENCOUNTER — Encounter: Payer: Self-pay | Admitting: Obstetrics and Gynecology

## 2019-07-28 ENCOUNTER — Ambulatory Visit (INDEPENDENT_AMBULATORY_CARE_PROVIDER_SITE_OTHER): Payer: BC Managed Care – PPO | Admitting: Obstetrics and Gynecology

## 2019-07-28 ENCOUNTER — Other Ambulatory Visit (HOSPITAL_COMMUNITY)
Admission: RE | Admit: 2019-07-28 | Discharge: 2019-07-28 | Disposition: A | Discharge status: Home / Self Care | Payer: BLUE CROSS/BLUE SHIELD | Source: Ambulatory Visit | Attending: Obstetrics and Gynecology | Admitting: Obstetrics and Gynecology

## 2019-07-28 ENCOUNTER — Other Ambulatory Visit: Payer: Self-pay | Admitting: Obstetrics and Gynecology

## 2019-07-28 VITALS — BP 120/80 | Ht 68.0 in | Wt 224.0 lb

## 2019-07-28 DIAGNOSIS — Z01419 Encounter for gynecological examination (general) (routine) without abnormal findings: Secondary | ICD-10-CM

## 2019-07-28 DIAGNOSIS — Z1151 Encounter for screening for human papillomavirus (HPV): Secondary | ICD-10-CM

## 2019-07-28 DIAGNOSIS — N72 Inflammatory disease of cervix uteri: Secondary | ICD-10-CM | POA: Insufficient documentation

## 2019-07-28 DIAGNOSIS — B977 Papillomavirus as the cause of diseases classified elsewhere: Secondary | ICD-10-CM | POA: Diagnosis not present

## 2019-07-28 DIAGNOSIS — Z124 Encounter for screening for malignant neoplasm of cervix: Secondary | ICD-10-CM

## 2019-07-28 DIAGNOSIS — Z1231 Encounter for screening mammogram for malignant neoplasm of breast: Secondary | ICD-10-CM

## 2019-07-28 DIAGNOSIS — Z3044 Encounter for surveillance of vaginal ring hormonal contraceptive device: Secondary | ICD-10-CM

## 2019-07-28 MED ORDER — ETONOGESTREL-ETHINYL ESTRADIOL 0.12-0.015 MG/24HR VA RING: VAGINAL_RING | VAGINAL | 3 refills | Status: DC

## 2019-07-28 NOTE — Patient Instructions (Signed)
I value your feedback and entrusting us with your care. If you get a [facility] patient survey, I would appreciate you taking the time to let us know about your experience today. Thank you!  As of June 24, 2019, your lab results will be released to your MyChart immediately, before I even have a chance to see them. Please give me time to review them and contact you if there are any abnormalities. Thank you for your patience.  

## 2019-07-28 NOTE — Progress Notes (Signed)
PCP:  McLean-Scocuzza, Nino Glow, MD   Chief Complaint  Patient presents with  . Gynecologic Exam     HPI:      [name] is a 39 y.o. G1P0 who LMP was Patient's last menstrual period was 07/07/2019 (approximate)., presents today for her annual examination.  Her menses are regular every 28-30 days, lasting 4-6 days with nuvaring.  Dysmenorrhea mild, occurring first 1-2 days of flow, relieved with NSAIDs. She does not have intermenstrual bleeding.  Sex activity: single partner, contraception-nuvaring. Having decreased libido recently.  Last Pap: 07/22/18 was neg cells with POS HPV DNA. Had colpo and neg bx 08/13/18 with Dr. Georgianne Fick. Repeat pap due today.  Hx of pap with no abnormalities /POS HPV DNA 2018 with neg Colpo and bx 10/18, too. Hx of STDs: HPV  Last mammogram: with Dr. Jamal Collin in past due to bilat fibroadenomas removed. No recent breast masses. Turning 39 tomorrow.  There is a FH of breast cancer in her mat great aunt, genetic testing not indicated . There is no FH of ovarian cancer. The patient does do self-breast exams. She has a hx of bilat fibroadenomas removed with Dr. Jamal Collin in 8/16.  Tobacco use: The patient denies current or previous tobacco use. Alcohol use: social drinker No drug use.  Exercise: moderately active  She does get adequate calcium and Vitamin D in her diet.  Labs with PCP  Past Medical History:  Diagnosis Date  . Fibroadenoma of both breasts   . Pre-diabetes   . Sarcoidosis    2009  . Vitamin D deficiency     Past Surgical History:  Procedure Laterality Date  . BREAST BIOPSY Right neg   2002 Dr. Larinda Buttery   . BREAST BIOPSY Bilateral 03/06/2015   Procedure: BILATERAL BREAST MASS EXCISION, right breast mass excision with ultrasound guided needle localization ;  Surgeon: Christene Lye, MD;  Location: [facility] ORS;  Service: General;  Laterality: Bilateral;  . COLONOSCOPY  2011   [place]   2007  . DILATION AND CURETTAGE OF UTERUS N/A 07/05/2017   Procedure: DILATATION AND CURETTAGE;  Surgeon: Homero Fellers, MD;  Location: [facility] ORS;  Service: Gynecology;  Laterality: N/A;  . mass removed  2002  . TONSILLECTOMY    . UPPER GI ENDOSCOPY  2007  . WISDOM TOOTH EXTRACTION  2006    Family History  Problem Relation Age of Onset  . Breast cancer Maternal Aunt 65  . Pancreatic cancer Maternal Grandfather 32  . Heart disease Maternal Grandmother   . Hypertension Maternal Grandmother     Social History   Socioeconomic History  . Marital status: Married    Spouse name: Not on file  . Number of children: Not on file  . Years of education: Not on file  . Highest education level: Not on file  Occupational History  . Not on file  Tobacco Use  . Smoking status: Never Smoker  . Smokeless tobacco: Never Used  Substance and Sexual Activity  . Alcohol use: Yes    Alcohol/week: 0.0 standard drinks    Comment: Occasionally  . Drug use: No  . Sexual activity: Yes    Birth control/protection: Inserts    Comment: Nuvaring  Other Topics Concern  . Not on file  Social History Narrative  . Not on file   Social Determinants of Health   Financial Resource Strain:   . Difficulty of Paying Living Expenses: Not on file  Food Insecurity:   . Worried About Charity fundraiser in the Last Year: Not on file  . Ran Out of Food in the Last Year: Not on file  Transportation Needs:   . Lack of Transportation (Medical): Not on file  . Lack of Transportation (Non-Medical): Not on file  Physical Activity:   . Days of Exercise per Week: Not on file  . Minutes of Exercise per Session: Not on file  Stress:   . Feeling of Stress : Not on file  Social Connections:   . Frequency of Communication with Friends and Family: Not on file  . Frequency of Social Gatherings with Friends and Family: Not on file  . Attends Religious Services: Not on file  . Active Member of Clubs or  Organizations: Not on file  . Attends Archivist Meetings: Not on file  . Marital Status: Not on file  Intimate Partner Violence:   . Fear of Current or Ex-Partner: Not on file  . Emotionally Abused: Not on file  . Physically Abused: Not on file  . Sexually Abused: Not on file    Current Meds  Medication Sig  . Adalimumab 40 MG/0.4ML PNKT Inject into the skin.  . Ascorbic Acid (VITAMIN C) 100 MG tablet Take 100 mg by mouth daily.  . cholecalciferol (VITAMIN D) 1000 units tablet Take 1,000 Units by mouth daily.  Marland Kitchen etonogestrel-ethinyl estradiol (NUVARING) 0.12-0.015 MG/24HR vaginal ring Insert vaginally and leave in place for 3 consecutive weeks, then remove for 1 week.  . ferrous sulfate 325 (65 FE) MG EC tablet Take 325 mg by mouth 3 (three) times daily with meals.  Marland Kitchen ibuprofen (ADVIL,MOTRIN) 600 MG tablet Take by mouth.  . [DISCONTINUED] etonogestrel-ethinyl estradiol (NUVARING) 0.12-0.015 MG/24HR vaginal ring Insert vaginally and leave in place for 3 consecutive weeks, then remove for 1 week.     ROS:  Review of Systems  Constitutional: Negative for fatigue, fever and unexpected weight change.  Respiratory: Negative for cough, shortness of breath and wheezing.   Cardiovascular: Negative for chest pain, palpitations and leg swelling.  Gastrointestinal: Negative for blood in stool, constipation, diarrhea, nausea and vomiting.  Endocrine: Negative for cold intolerance, heat intolerance and polyuria.  Genitourinary: Negative for dyspareunia, dysuria, flank pain, frequency, genital sores, hematuria, menstrual problem, pelvic pain, urgency, vaginal bleeding, vaginal discharge and vaginal pain.  Musculoskeletal: Negative for back pain, joint swelling and myalgias.  Skin: Negative for rash.  Neurological: Negative for dizziness, syncope, light-headedness, numbness and headaches.  Hematological: Negative for adenopathy.  Psychiatric/Behavioral: Negative for agitation,  confusion, sleep disturbance and suicidal ideas. The patient is not nervous/anxious.      Objective: BP 120/80   Ht 5\' 8"  (1.727 m)   Wt 224 lb (101.6 kg)   LMP 07/07/2019 (Approximate)   BMI 34.06 kg/m    Physical Exam Constitutional:      Appearance: She is well-developed.  Genitourinary:     Vulva, vagina, uterus, right adnexa and left adnexa normal.     No vulval lesion or tenderness noted.     No vaginal discharge, erythema or tenderness.     No cervical motion tenderness or polyp.     Uterus is not enlarged or tender.     No right or left adnexal mass present.     Right adnexa not tender.     Left adnexa not tender.  Neck:     Thyroid: No thyromegaly.  Cardiovascular:     Rate and Rhythm: Normal rate  and regular rhythm.     Heart sounds: Normal heart sounds. No murmur.  Pulmonary:     Effort: Pulmonary effort is normal.     Breath sounds: Normal breath sounds.  Chest:     Breasts:        Right: No mass, nipple discharge, skin change or tenderness.        Left: No mass, nipple discharge, skin change or tenderness.  Abdominal:     Palpations: Abdomen is soft.     Tenderness: There is no abdominal tenderness. There is no guarding.  Musculoskeletal:        General: Normal range of motion.     Cervical back: Normal range of motion.  Neurological:     General: No focal deficit present.     Mental Status: She is alert and oriented to person, place, and time.     Cranial Nerves: No cranial nerve deficit.  Skin:    General: Skin is warm and dry.  Psychiatric:        Mood and Affect: Mood normal.        Behavior: Behavior normal.        Thought Content: Thought content normal.        Judgment: Judgment normal.  Vitals reviewed.     Assessment/Plan: Encounter for annual routine gynecological examination  Cervical cancer screening - Plan: [facility] PAP  Screening for HPV (human papillomavirus) - Plan: [facility] PAP  High risk human papilloma virus (HPV) infection of  cervix - Plan: [facility] PAP; repeat pap today. Will call with results.   Encounter for screening mammogram for malignant neoplasm of breast; pt to sched mammo  Encounter for surveillance of vaginal ring hormonal contraceptive device - Nuvaring RF.  - Plan: etonogestrel-ethinyl estradiol (NUVARING) 0.12-0.015 MG/24HR vaginal ring      Meds ordered this encounter  Medications  . etonogestrel-ethinyl estradiol (NUVARING) 0.12-0.015 MG/24HR vaginal ring    Sig: Insert vaginally and leave in place for 3 consecutive weeks, then remove for 1 week.    Dispense:  3 each    Refill:  3    Order Specific Question:   Supervising Provider    Answer:   Gae Dry [id number]          GYN counsel breast self exam, adequate intake of calcium and vitamin D, diet and exercise     F/U  Return in about 1 year (around 07/27/2020).  Deneisha Dade B. Treylin Burtch, [name] 07/28/2019 4:05 PM

## 2019-07-30 LAB — CYTOLOGY - PAP
Adequacy: ABSENT
Comment: NEGATIVE
Diagnosis: NEGATIVE
High risk HPV: NEGATIVE

## 2019-08-01 NOTE — Progress Notes (Signed)
Pls let pt know pap was normal. Thx.

## 2019-08-02 NOTE — Progress Notes (Signed)
Pt aware.

## 2019-08-02 NOTE — Progress Notes (Signed)
Called pt, no answer, LVMTRC. 

## 2019-08-13 ENCOUNTER — Other Ambulatory Visit: Payer: Self-pay | Admitting: Obstetrics and Gynecology

## 2019-08-13 DIAGNOSIS — Z3044 Encounter for surveillance of vaginal ring hormonal contraceptive device: Secondary | ICD-10-CM

## 2019-09-08 ENCOUNTER — Other Ambulatory Visit: Payer: Self-pay | Admitting: Obstetrics and Gynecology

## 2019-09-08 ENCOUNTER — Ambulatory Visit
Admission: RE | Admit: 2019-09-08 | Discharge: 2019-09-08 | Disposition: A | Discharge status: Home / Self Care | Payer: BC Managed Care – PPO | Source: Ambulatory Visit | Attending: Obstetrics and Gynecology | Admitting: Obstetrics and Gynecology

## 2019-09-08 DIAGNOSIS — R928 Other abnormal and inconclusive findings on diagnostic imaging of breast: Secondary | ICD-10-CM

## 2019-09-08 DIAGNOSIS — Z1231 Encounter for screening mammogram for malignant neoplasm of breast: Secondary | ICD-10-CM | POA: Diagnosis not present

## 2019-09-08 DIAGNOSIS — N6489 Other specified disorders of breast: Secondary | ICD-10-CM

## 2019-09-17 ENCOUNTER — Ambulatory Visit
Admission: RE | Admit: 2019-09-17 | Discharge: 2019-09-17 | Disposition: A | Discharge status: Home / Self Care | Payer: BC Managed Care – PPO | Source: Ambulatory Visit | Attending: Obstetrics and Gynecology | Admitting: Obstetrics and Gynecology

## 2019-09-17 ENCOUNTER — Other Ambulatory Visit: Payer: Self-pay

## 2019-09-17 ENCOUNTER — Other Ambulatory Visit: Payer: Self-pay | Admitting: Obstetrics and Gynecology

## 2019-09-17 DIAGNOSIS — N6489 Other specified disorders of breast: Secondary | ICD-10-CM

## 2019-09-17 DIAGNOSIS — R928 Other abnormal and inconclusive findings on diagnostic imaging of breast: Secondary | ICD-10-CM

## 2019-09-17 DIAGNOSIS — N6021 Fibroadenosis of right breast: Secondary | ICD-10-CM | POA: Diagnosis not present

## 2019-09-17 DIAGNOSIS — N6310 Unspecified lump in the right breast, unspecified quadrant: Secondary | ICD-10-CM

## 2019-09-24 ENCOUNTER — Other Ambulatory Visit: Payer: BC Managed Care – PPO

## 2019-09-24 ENCOUNTER — Ambulatory Visit: Payer: BC Managed Care – PPO

## 2019-09-24 ENCOUNTER — Ambulatory Visit
Admission: RE | Admit: 2019-09-24 | Discharge: 2019-09-24 | Disposition: A | Discharge status: Home / Self Care | Payer: BC Managed Care – PPO | Source: Ambulatory Visit | Attending: Obstetrics and Gynecology | Admitting: Obstetrics and Gynecology

## 2019-09-24 DIAGNOSIS — N6310 Unspecified lump in the right breast, unspecified quadrant: Secondary | ICD-10-CM | POA: Insufficient documentation

## 2019-09-24 DIAGNOSIS — R928 Other abnormal and inconclusive findings on diagnostic imaging of breast: Secondary | ICD-10-CM

## 2019-09-24 DIAGNOSIS — N6314 Unspecified lump in the right breast, lower inner quadrant: Secondary | ICD-10-CM | POA: Diagnosis not present

## 2019-09-27 LAB — SURGICAL PATHOLOGY

## 2019-09-28 DIAGNOSIS — M2550 Pain in unspecified joint: Secondary | ICD-10-CM | POA: Diagnosis not present

## 2019-09-28 DIAGNOSIS — D869 Sarcoidosis, unspecified: Secondary | ICD-10-CM | POA: Diagnosis not present

## 2019-09-28 DIAGNOSIS — R079 Chest pain, unspecified: Secondary | ICD-10-CM | POA: Diagnosis not present

## 2019-09-28 DIAGNOSIS — R05 Cough: Secondary | ICD-10-CM | POA: Diagnosis not present

## 2019-09-28 DIAGNOSIS — H209 Unspecified iridocyclitis: Secondary | ICD-10-CM | POA: Diagnosis not present

## 2020-01-25 NOTE — Progress Notes (Signed)
[name], FNP   Chief Complaint  Patient presents with  . Pelvic Pain    entire area, pt thinks its nuvaring-has noticed abdominal pain when she inserts it, denies uti sx    HPI:      [name] is a 39 y.o. G2P0020 whose LMP was Patient's last menstrual period was 12/29/2019 (approximate)., presents today for pelvic discomfort/cramping the past few months. Sx start a couple days after inserting the nuvaring and resolve on the placebo wk. Cramping is daily, sometimes need NSAIDs/tylenol. Has occas constipation but knows when to change diet for this. No urin/vag sx.  Also notices stomach upset for a few days after insertion. Pt has been on nuvaring for many yrs but was changed to generic product 1/21. Menses are regular every 28-30 days, lasting 4-6 days. Dysmenorrhea mild, occurring first 1-2 days of flow, relieved with NSAIDs. She does not have intermenstrual bleeding. Small leio on 12/18 GYN u/s.   She is sex active with husband, no pain/bleeding with sex. No new partners.   Did OCPs in past but doesn't like daily option. No hx of HTN, DVTs.  Annual done 1/21.  Past Medical History:  Diagnosis Date  . Fibroadenoma of both breasts   . Pre-diabetes   . Sarcoidosis    2009  . Vitamin D deficiency     Past Surgical History:  Procedure Laterality Date  . BREAST BIOPSY Right neg   2002 Dr. Larinda Buttery   . BREAST BIOPSY Bilateral 03/06/2015   Procedure: BILATERAL BREAST MASS EXCISION, right breast mass excision with ultrasound guided needle localization ;  Surgeon: Christene Lye, MD;  Location: [facility];  Service: General;  Laterality: Bilateral;  . COLONOSCOPY  2011   [place]  2007  . DILATION AND CURETTAGE OF UTERUS N/A 07/05/2017   Procedure: DILATATION AND CURETTAGE;  Surgeon: Homero Fellers, MD;  Location: [facility] OR[facility];  Service: Gynecology;  Laterality: N/A;  . mass removed  2002  . TONSILLECTOMY    .  UPPER GI ENDOSCOPY  2007  . WISDOM TOOTH EXTRACTION  2006    Family History  Problem Relation Age of Onset  . Breast cancer Maternal Aunt 65  . Pancreatic cancer Maternal Grandfather 39  . Heart disease Maternal Grandmother   . Hypertension Maternal Grandmother     Social History   Socioeconomic History  . Marital status: Married    Spouse name: Not on file  . Number of children: Not on file  . Years of education: Not on file  . Highest education level: Not on file  Occupational History  . Not on file  Tobacco Use  . Smoking status: Never Smoker  . Smokeless tobacco: Never Used  Vaping Use  . Vaping Use: Never used  Substance and Sexual Activity  . Alcohol use: Yes    Alcohol/week: 0.0 standard drinks    Comment: Occasionally  . Drug use: No  . Sexual activity: Yes    Birth control/protection: Inserts    Comment: Nuvaring  Other Topics Concern  . Not on file  Social History Narrative  . Not on file   Social Determinants of Health   Financial Resource Strain:   . Difficulty of Paying Living Expenses:   Food Insecurity:   . Worried About Charity fundraiser in the Last Year:   . Arboriculturist in the Last Year:   Transportation Needs:   .  Lack of Transportation (Medical):   Marland Kitchen Lack of Transportation (Non-Medical):   Physical Activity:   . Days of Exercise per Week:   . Minutes of Exercise per Session:   Stress:   . Feeling of Stress :   Social Connections:   . Frequency of Communication with Friends and Family:   . Frequency of Social Gatherings with Friends and Family:   . Attends Religious Services:   . Active Member of Clubs or Organizations:   . Attends Archivist Meetings:   Marland Kitchen Marital Status:   Intimate Partner Violence:   . Fear of Current or Ex-Partner:   . Emotionally Abused:   Marland Kitchen Physically Abused:   . Sexually Abused:     Outpatient Medications Prior to Visit  Medication Sig Dispense Refill  . Adalimumab 40 MG/0.4ML PNKT Inject  into the skin.    . Ascorbic Acid (VITAMIN C) 100 MG tablet Take 100 mg by mouth daily.    . cholecalciferol (VITAMIN D) 1000 units tablet Take 1,000 Units by mouth daily.    Marland Kitchen etonogestrel-ethinyl estradiol (NUVARING) 0.12-0.015 MG/24HR vaginal ring Insert vaginally and leave in place for 3 consecutive weeks, then remove for 1 week. 3 each 3  . ferrous sulfate 325 (65 FE) MG EC tablet Take 325 mg by mouth 3 (three) times daily with meals.    Marland Kitchen ibuprofen (ADVIL,MOTRIN) 600 MG tablet Take by mouth.    . Vitamin D, Ergocalciferol, (DRISDOL) 1.25 MG (50000 UNIT) CAPS capsule Take 50,000 Units by mouth once a week.     No facility-administered medications prior to visit.      ROS:  Review of Systems  Constitutional: Negative for fever.  Gastrointestinal: Negative for blood in stool, constipation, diarrhea, nausea and vomiting.  Genitourinary: Positive for pelvic pain. Negative for dyspareunia, dysuria, flank pain, frequency, hematuria, urgency, vaginal bleeding, vaginal discharge and vaginal pain.  Musculoskeletal: Negative for back pain.  Skin: Negative for rash.  BREAST: No symptoms   OBJECTIVE:   Vitals:  BP 140/90   Ht 5\' 8"  (1.727 m)   Wt 231 lb (104.8 kg)   LMP 12/29/2019 (Approximate)   BMI 35.12 kg/m   Physical Exam Vitals reviewed.  Constitutional:      Appearance: She is well-developed.  Pulmonary:     Effort: Pulmonary effort is normal.  Abdominal:     Palpations: Abdomen is soft.     Tenderness: There is abdominal tenderness in the suprapubic area. There is no guarding or rebound.  Genitourinary:    General: Normal vulva.     Pubic Area: No rash.      Labia:        Right: No rash, tenderness or lesion.        Left: No rash, tenderness or lesion.      Vagina: Normal. No vaginal discharge, erythema or tenderness.     Cervix: Normal.     Uterus: Normal. Enlarged. Not tender.      Adnexa: Right adnexa normal and left adnexa normal.       Right: No mass or  tenderness.         Left: No mass or tenderness.    Musculoskeletal:        General: Normal range of motion.     Cervical back: Normal range of motion.  Skin:    General: Skin is warm and dry.  Neurological:     General: No focal deficit present.     Mental Status: She is alert and  oriented to person, place, and time.  Psychiatric:        Mood and Affect: Mood normal.        Behavior: Behavior normal.        Thought Content: Thought content normal.        Judgment: Judgment normal.     Results:  ULTRASOUND REPORT  Location: [facility]  Date of Service: 01/26/2020    Indications:Pelvic Pain   Findings:  The uterus is anteverted and measures 9.6 x 6.6 x 6.3 cm. Echo texture is heterogenous with evidence of focal masses. Within the uterus are multiple suspected fibroids measuring: Fibroid 1: 32.9 x 35.7 x 31.4 mm intramural right, posterior Fibroid 2: 15.6 x 10.7 x 17.2 mm intramural anterior Fibroid 3: 21.5 x 22.0 x 28.4 mm subserosal anterior Fibroid 4: 19.7 x 21.3 x 20.5 mm intramural posterior fundal Fibroid 5: 20.1 x 14.9 x 15.9 mm subserosal anterior right Fibroid 6: 17.2 x 14.8 x 17.8 mm subserosal anterior left  The Endometrium measures 3.6 mm.  Neither ovary is visible.   Survey of the adnexa demonstrates no adnexal masses. There is no free fluid in the cul de sac.  Impression: 1. There are multiple uterine fibroids. The largest were measured.  2. The endometrial lining is thin and normal in appearance.  3. Neither ovary is visible.   Recommendations: 1.Clinical correlation with the patient's History and Physical Exam.   Gweneth Dimitri, RT   Assessment/Plan: Pelvic cramping - Plan: US PELVIS TRANSVAGINAL NON-OB (TV ONLY); Slightly tender on abd exam, neg GYN exam. Sx occur with nuvaring in place.  GYN u/s with mult small leio. Question sx due to hormonal change with generic nuvaring vs branded product or due to leio from u/s results. Can  try OCPs short term to see if sx improve. Rx aviane eRxd. If sx persist, then most likely due to [facility] and can discuss better mgmt options. If sx resolve, then could have been nuvaring. Discussed other BC options as well since prefers non-daily method.  Branded nuvaring Rx very expensive.  Pt to f/u after trying OCPs. Condoms for 1 wk.   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  Meds ordered this encounter  Medications  . levonorgestrel-ethinyl estradiol (AVIANE) 0.1-20 MG-MCG tablet    Sig: Take 1 tablet by mouth daily.    Dispense:  84 tablet    Refill:  0    Order Specific Question:   Supervising Provider    Answer:   Gae Dry [[id number]]      Return if symptoms worsen or fail to improve.  Charley Miske B. Mikya Don, [name] 01/27/2020 9:13 AM

## 2020-01-26 ENCOUNTER — Ambulatory Visit (INDEPENDENT_AMBULATORY_CARE_PROVIDER_SITE_OTHER): Payer: BC Managed Care – PPO | Admitting: Obstetrics and Gynecology

## 2020-01-26 ENCOUNTER — Other Ambulatory Visit: Payer: Self-pay

## 2020-01-26 ENCOUNTER — Encounter: Payer: Self-pay | Admitting: Obstetrics and Gynecology

## 2020-01-26 ENCOUNTER — Other Ambulatory Visit: Payer: Self-pay | Admitting: Obstetrics and Gynecology

## 2020-01-26 ENCOUNTER — Ambulatory Visit (INDEPENDENT_AMBULATORY_CARE_PROVIDER_SITE_OTHER): Payer: BC Managed Care – PPO

## 2020-01-26 VITALS — BP 140/90 | Ht 68.0 in | Wt 231.0 lb

## 2020-01-26 DIAGNOSIS — R102 Pelvic and perineal pain: Secondary | ICD-10-CM

## 2020-01-26 DIAGNOSIS — Z30011 Encounter for initial prescription of contraceptive pills: Secondary | ICD-10-CM

## 2020-01-26 DIAGNOSIS — D219 Benign neoplasm of connective and other soft tissue, unspecified: Secondary | ICD-10-CM

## 2020-01-26 NOTE — Patient Instructions (Signed)
I value your feedback and entrusting us with your care. If you get a [facility] patient survey, I would appreciate you taking the time to let us know about your experience today. Thank you!  As of June 24, 2019, your lab results will be released to your MyChart immediately, before I even have a chance to see them. Please give me time to review them and contact you if there are any abnormalities. Thank you for your patience.  

## 2020-01-27 ENCOUNTER — Encounter: Payer: Self-pay | Admitting: Obstetrics and Gynecology

## 2020-01-27 DIAGNOSIS — D219 Benign neoplasm of connective and other soft tissue, unspecified: Secondary | ICD-10-CM | POA: Insufficient documentation

## 2020-01-27 MED ORDER — LEVONORGESTREL-ETHINYL ESTRAD 0.1-20 MG-MCG PO TABS: 1.0000 | ORAL_TABLET | Freq: Every day | ORAL | 0 refills | Status: DC

## 2020-04-17 ENCOUNTER — Other Ambulatory Visit: Payer: Self-pay | Admitting: Obstetrics and Gynecology

## 2020-04-17 ENCOUNTER — Telehealth: Payer: Self-pay

## 2020-04-17 DIAGNOSIS — Z30011 Encounter for initial prescription of contraceptive pills: Secondary | ICD-10-CM

## 2020-04-17 DIAGNOSIS — N6489 Other specified disorders of breast: Secondary | ICD-10-CM

## 2020-04-17 MED ORDER — LEVONORGESTREL-ETHINYL ESTRAD 0.1-20 MG-MCG PO TABS: 1.0000 | ORAL_TABLET | Freq: Every day | ORAL | 0 refills | Status: DC

## 2020-04-17 NOTE — Telephone Encounter (Signed)
Pt aware.

## 2020-04-17 NOTE — Telephone Encounter (Signed)
Called pt, no answer, LVMTRC. 

## 2020-04-17 NOTE — Telephone Encounter (Signed)
Pt called triage line stating she is needing a refill on her OCP. 90 day supply. Also, she has received a letter from [facility] stating her mammogram is due

## 2020-04-17 NOTE — Telephone Encounter (Signed)
OCP RF eRxd. Mammo and u/s order placed. Pt to call [place] to sched. Thx.

## 2020-05-10 ENCOUNTER — Telehealth: Payer: Self-pay

## 2020-05-10 NOTE — Telephone Encounter (Signed)
Is she having BTB every pill pack or just this one? If every pill pack, then we need to change pills. If just this pack, it can happen with generic pills--just complete this pack and f/u  if happens next month. Thx

## 2020-05-10 NOTE — Telephone Encounter (Signed)
Pt calling; has questions about bc.  [phone]  Pt states she's on her 4th pack of bcp - aviane; started bleeding yesterday; wasn't supposed to start until 11/4th; no missed or late pills.  Will send msg to [facility].

## 2020-05-10 NOTE — Telephone Encounter (Signed)
Pt aware.

## 2020-05-17 ENCOUNTER — Ambulatory Visit
Admission: RE | Admit: 2020-05-17 | Discharge: 2020-05-17 | Disposition: A | Discharge status: Home / Self Care | Payer: BC Managed Care – PPO | Source: Ambulatory Visit | Attending: Obstetrics and Gynecology | Admitting: Obstetrics and Gynecology

## 2020-05-17 ENCOUNTER — Encounter: Payer: Self-pay | Admitting: Obstetrics and Gynecology

## 2020-05-17 ENCOUNTER — Other Ambulatory Visit: Payer: Self-pay

## 2020-05-17 DIAGNOSIS — N6489 Other specified disorders of breast: Secondary | ICD-10-CM

## 2020-05-17 DIAGNOSIS — R928 Other abnormal and inconclusive findings on diagnostic imaging of breast: Secondary | ICD-10-CM | POA: Diagnosis not present

## 2020-05-17 DIAGNOSIS — N6312 Unspecified lump in the right breast, upper inner quadrant: Secondary | ICD-10-CM | POA: Diagnosis not present

## 2020-05-18 ENCOUNTER — Telehealth: Payer: Self-pay

## 2020-05-18 NOTE — Telephone Encounter (Signed)
Pt reached out to you on 10/27.

## 2020-05-18 NOTE — Telephone Encounter (Signed)
No late/missed pills confirmed. Pt aware.

## 2020-05-18 NOTE — Telephone Encounter (Signed)
Pls find out from pt if BTB is just this pill pack or all of them since starting OCPs. Didn't get answer from 10/21 msg. If this pill pack only, let's see what happens next pill pack. If every month, then we can change pills or go back to nuvaring.

## 2020-05-18 NOTE — Telephone Encounter (Signed)
Pt calling; cycle has been on for 10 days; she continue to take her bc; not sure if this is cycle or something else going on.  [phone]

## 2020-05-18 NOTE — Telephone Encounter (Signed)
See what bleeding does next pill pack. Confirm no late/missed pills. If bleeding persist next pill pack, will change products. Thx

## 2020-05-18 NOTE — Telephone Encounter (Signed)
Pt is on third pill pack. First two packs she did good. Its only this pill pack where she is having this vag bleeding. Says bleeding flow is same since day 1, has not slowed down a bit. Denies changing pad more than once in less than an hr. Has light cramping, had NEG UPT 1 week and a half ago (before bleeding started).

## 2020-07-11 ENCOUNTER — Other Ambulatory Visit: Payer: Self-pay | Admitting: Obstetrics and Gynecology

## 2020-07-11 DIAGNOSIS — Z20822 Contact with and (suspected) exposure to covid-19: Secondary | ICD-10-CM | POA: Diagnosis not present

## 2020-07-11 DIAGNOSIS — Z30011 Encounter for initial prescription of contraceptive pills: Secondary | ICD-10-CM

## 2020-08-02 ENCOUNTER — Ambulatory Visit: Payer: BC Managed Care – PPO | Admitting: Obstetrics and Gynecology

## 2020-08-07 ENCOUNTER — Other Ambulatory Visit: Payer: Self-pay | Admitting: Obstetrics and Gynecology

## 2020-08-07 DIAGNOSIS — Z30011 Encounter for initial prescription of contraceptive pills: Secondary | ICD-10-CM

## 2020-09-04 ENCOUNTER — Other Ambulatory Visit: Payer: Self-pay | Admitting: Obstetrics and Gynecology

## 2020-09-04 DIAGNOSIS — Z30011 Encounter for initial prescription of contraceptive pills: Secondary | ICD-10-CM

## 2020-09-05 ENCOUNTER — Other Ambulatory Visit: Payer: Self-pay | Admitting: Obstetrics and Gynecology

## 2020-09-05 DIAGNOSIS — Z30011 Encounter for initial prescription of contraceptive pills: Secondary | ICD-10-CM

## 2020-09-05 NOTE — Telephone Encounter (Signed)
Pt calling; has tried to get a refill for two days now with no response from [place].  [phone]  Left detailed msg refill was eRx'd today.

## 2020-09-06 NOTE — Progress Notes (Signed)
PCP:  [name], FNP   Chief Complaint  Patient presents with  . Gynecologic Exam    Night sweats, has had some irreg cycles x couple of months     HPI:      [name] is a 40 y.o. G1P0 who LMP was Patient's last menstrual period was 09/03/2020 (exact date)., presents today for her annual examination.  Her menses are regular every 28-30 days, lasting 7 days with OCPs. Menses were shorter with nuvaring, but she changed due to pelvic pain with nuvaring; pain sx resolved after change to OCPs.  Dysmenorrhea mild, occurring first 1-2 days of flow, relieved with NSAIDs. She does not have intermenstrual bleeding. Hx of leio. Having vasomotor sx now, particularly at night.  Sex activity: single partner, contraception-OCPs.  Last Pap: 07/28/19 was neg cells/neg HPV DNA. S/p neg cells with POS HPV DNA 1/20 with colpo and neg bx 08/13/18 with Dr. Georgianne Fick. Repeat pap due today.  Hx of pap with no abnormalities /POS HPV DNA 2018 with neg Colpo and bx 10/18, too. 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 Hx of bilat fibroadenomas removed with Dr. Jamal Collin 8/16.  There is a FH of breast cancer in her mat great great aunt, genetic testing not indicated. There is no FH of ovarian cancer. The patient does do self-breast exams.   Tobacco use: The patient denies current or previous tobacco use. Alcohol use: social drinker No drug use.  Exercise: moderately active  She does get adequate calcium and Vitamin D in her diet.  No recent labs.   Past Medical History:  Diagnosis Date  . Fibroadenoma of both breasts   . Pre-diabetes   . Sarcoidosis    2009  . Vitamin D deficiency     Past Surgical History:  Procedure Laterality Date  . BREAST BIOPSY Right neg   2002 Dr. Larinda Buttery   . BREAST BIOPSY Bilateral 03/06/2015   Procedure: BILATERAL BREAST MASS EXCISION, right  breast mass excision with ultrasound guided needle localization ;  Surgeon: Christene Lye, MD;  Location: [facility] OR[facility];  Service: General;  Laterality: Bilateral;  . BREAST BIOPSY Right 2021   coil clip, [place] BX, PASH   . COLONOSCOPY  2011   [place]  2007  . DILATION AND CURETTAGE OF UTERUS N/A 07/05/2017   Procedure: DILATATION AND CURETTAGE;  Surgeon: Homero Fellers, MD;  Location: [facility] ORS;  Service: Gynecology;  Laterality: N/A;  . mass removed  2002  . TONSILLECTOMY    . UPPER GI ENDOSCOPY  2007  . WISDOM TOOTH EXTRACTION  2006    Family History  Problem Relation Age of Onset  . Breast cancer Other 8  . Pancreatic cancer Maternal Grandfather 35  . Heart disease Maternal Grandmother   . Hypertension Maternal Grandmother     Social History   Socioeconomic History  . Marital status: Married    Spouse name: Not on file  . Number of children: Not on file  . Years of education: Not on file  . Highest education level: Not on file  Occupational History  . Not on file  Tobacco Use  . Smoking status: Never Smoker  . Smokeless tobacco: Never Used  Vaping Use  . Vaping Use: Never used  Substance and Sexual Activity  .  Alcohol use: Yes    Alcohol/week: 0.0 standard drinks    Comment: Occasionally  . Drug use: No  . Sexual activity: Yes    Birth control/protection: Pill  Other Topics Concern  . Not on file  Social History Narrative  . Not on file   Social Determinants of Health   Financial Resource Strain: Not on file  Food Insecurity: Not on file  Transportation Needs: Not on file  Physical Activity: Not on file  Stress: Not on file  Social Connections: Not on file  Intimate Partner Violence: Not on file    Current Meds  Medication Sig  . Adalimumab 40 MG/0.4ML PNKT Inject into the skin.  . Ascorbic Acid (VITAMIN C) 100 MG tablet Take 100 mg by mouth daily.  . cholecalciferol (VITAMIN D) 1000 units tablet  Take 1,000 Units by mouth daily.  . ferrous sulfate 325 (65 FE) MG EC tablet Take 325 mg by mouth 3 (three) times daily with meals.  Marland Kitchen ibuprofen (ADVIL,MOTRIN) 600 MG tablet Take by mouth.  . Norethindrone Acetate-Ethinyl Estrad-FE (MICROGESTIN 24 FE) 1-20 MG-MCG(24) tablet Take 1 tablet by mouth daily.  . Vitamin D, Ergocalciferol, (DRISDOL) 1.25 MG (50000 UNIT) CAPS capsule Take 50,000 Units by mouth once a week.  . [DISCONTINUED] AVIANE 0.1-20 MG-MCG tablet Take 1 tablet by mouth once daily     ROS:  Review of Systems  Constitutional: Negative for fatigue, fever and unexpected weight change.  Respiratory: Negative for cough, shortness of breath and wheezing.   Cardiovascular: Negative for chest pain, palpitations and leg swelling.  Gastrointestinal: Negative for blood in stool, constipation, diarrhea, nausea and vomiting.  Endocrine: Negative for cold intolerance, heat intolerance and polyuria.  Genitourinary: Negative for dyspareunia, dysuria, flank pain, frequency, genital sores, hematuria, menstrual problem, pelvic pain, urgency, vaginal bleeding, vaginal discharge and vaginal pain.  Musculoskeletal: Negative for back pain, joint swelling and myalgias.  Skin: Negative for rash.  Neurological: Negative for dizziness, syncope, light-headedness, numbness and headaches.  Hematological: Negative for adenopathy.  Psychiatric/Behavioral: Negative for agitation, confusion, sleep disturbance and suicidal ideas. The patient is not nervous/anxious.      Objective: BP 128/70   Ht 5\' 8"  (1.727 m)   Wt 242 lb (109.8 kg)   LMP 09/03/2020 (Exact Date)   BMI 36.80 kg/m    Physical Exam Constitutional:      Appearance: She is well-developed.  Genitourinary:     Vulva normal.     Right Labia: No rash, tenderness or lesions.    Left Labia: No tenderness, lesions or rash.    No vaginal discharge, erythema or tenderness.      Right Adnexa: not tender and no mass present.    Left Adnexa:  not tender and no mass present.    No cervical motion tenderness, friability or polyp.     Uterus is not enlarged or tender.  Breasts:     Right: No mass, nipple discharge, skin change or tenderness.     Left: No mass, nipple discharge, skin change or tenderness.    Neck:     Thyroid: No thyromegaly.  Cardiovascular:     Rate and Rhythm: Normal rate and regular rhythm.     Heart sounds: Normal heart sounds. No murmur heard.   Pulmonary:     Effort: Pulmonary effort is normal.     Breath sounds: Normal breath sounds.  Abdominal:     Palpations: Abdomen is soft.     Tenderness: There is no abdominal tenderness. There is  no guarding or rebound.  Musculoskeletal:        General: Normal range of motion.     Cervical back: Normal range of motion.  Lymphadenopathy:     Cervical: No cervical adenopathy.  Neurological:     General: No focal deficit present.     Mental Status: She is alert and oriented to person, place, and time.     Cranial Nerves: No cranial nerve deficit.  Skin:    General: Skin is warm and dry.  Psychiatric:        Mood and Affect: Mood normal.        Behavior: Behavior normal.        Thought Content: Thought content normal.        Judgment: Judgment normal.  Vitals reviewed.     Assessment/Plan: Encounter for annual routine gynecological examination  Cervical cancer screening - Plan: Cytology - PAP  Screening for HPV (human papillomavirus) - Plan: Cytology - PAP  High risk human papilloma virus (HPV) infection of cervix - Plan: Cytology - PAP; repeat pap today.  Encounter for screening mammogram for malignant neoplasm of breast - Plan: US BREAST LTD UNI RIGHT INC AXILLA, MM DIAG BREAST TOMO BILATERAL  Pseudoangiomatous stromal hyperplasia of breast - Plan: US BREAST LTD UNI RIGHT INC AXILLA, MM DIAG BREAST TOMO BILATERAL  Encounter for surveillance of contraceptive pills - Plan: Norethindrone Acetate-Ethinyl Estrad-FE (MICROGESTIN 24 FE) 1-20  MG-MCG(24) tablet; OCP change to lomedia to make periods shorter. Rx eRxd, f/u prn.   Blood tests for routine general physical examination - Plan: Comprehensive metabolic panel, Lipid panel, Hemoglobin A1c  Screening cholesterol level - Plan: Lipid panel  Screening for diabetes mellitus - Plan: Hemoglobin A1c  BMI 36.0-36.9,adult - Plan: Comprehensive metabolic panel, Lipid panel, Hemoglobin A1c  Need for immunization against influenza - Plan: Flu Vaccine QUAD 36+ mos IM       Meds ordered this encounter  Medications  . Norethindrone Acetate-Ethinyl Estrad-FE (MICROGESTIN 24 FE) 1-20 MG-MCG(24) tablet    Sig: Take 1 tablet by mouth daily.    Dispense:  84 tablet    Refill:  3    Order Specific Question:   Supervising Provider    Answer:   Gae Dry [[id number]]          GYN counsel breast self exam, adequate intake of calcium and vitamin D, diet and exercise     F/U  Return in about 1 year (around 09/07/2021).  Baer Hinton B. Aryonna Gunnerson, [name] 09/07/2020 4:27 PM

## 2020-09-07 ENCOUNTER — Other Ambulatory Visit (HOSPITAL_COMMUNITY)
Admission: RE | Admit: 2020-09-07 | Discharge: 2020-09-07 | Disposition: A | Discharge status: Home / Self Care | Payer: BC Managed Care – PPO | Source: Ambulatory Visit | Attending: Obstetrics and Gynecology | Admitting: Obstetrics and Gynecology

## 2020-09-07 ENCOUNTER — Ambulatory Visit (INDEPENDENT_AMBULATORY_CARE_PROVIDER_SITE_OTHER): Payer: BC Managed Care – PPO | Admitting: Obstetrics and Gynecology

## 2020-09-07 ENCOUNTER — Encounter: Payer: Self-pay | Admitting: Obstetrics and Gynecology

## 2020-09-07 ENCOUNTER — Other Ambulatory Visit: Payer: Self-pay

## 2020-09-07 VITALS — BP 128/70 | Ht 68.0 in | Wt 242.0 lb

## 2020-09-07 DIAGNOSIS — Z131 Encounter for screening for diabetes mellitus: Secondary | ICD-10-CM

## 2020-09-07 DIAGNOSIS — Z01419 Encounter for gynecological examination (general) (routine) without abnormal findings: Secondary | ICD-10-CM | POA: Diagnosis not present

## 2020-09-07 DIAGNOSIS — Z6836 Body mass index (BMI) 36.0-36.9, adult: Secondary | ICD-10-CM | POA: Diagnosis not present

## 2020-09-07 DIAGNOSIS — B977 Papillomavirus as the cause of diseases classified elsewhere: Secondary | ICD-10-CM | POA: Diagnosis not present

## 2020-09-07 DIAGNOSIS — Z1151 Encounter for screening for human papillomavirus (HPV): Secondary | ICD-10-CM

## 2020-09-07 DIAGNOSIS — Z0000 Encounter for general adult medical examination without abnormal findings: Secondary | ICD-10-CM | POA: Diagnosis not present

## 2020-09-07 DIAGNOSIS — Z13220 Encounter for screening for lipoid disorders: Secondary | ICD-10-CM | POA: Diagnosis not present

## 2020-09-07 DIAGNOSIS — Z23 Encounter for immunization: Secondary | ICD-10-CM | POA: Diagnosis not present

## 2020-09-07 DIAGNOSIS — Z3041 Encounter for surveillance of contraceptive pills: Secondary | ICD-10-CM

## 2020-09-07 DIAGNOSIS — N6489 Other specified disorders of breast: Secondary | ICD-10-CM

## 2020-09-07 DIAGNOSIS — Z124 Encounter for screening for malignant neoplasm of cervix: Secondary | ICD-10-CM

## 2020-09-07 DIAGNOSIS — N72 Inflammatory disease of cervix uteri: Secondary | ICD-10-CM | POA: Insufficient documentation

## 2020-09-07 DIAGNOSIS — Z1231 Encounter for screening mammogram for malignant neoplasm of breast: Secondary | ICD-10-CM

## 2020-09-07 MED ORDER — MICROGESTIN 24 FE 1-20 MG-MCG PO TABS: 1.0000 | ORAL_TABLET | Freq: Every day | ORAL | 3 refills | Status: DC

## 2020-09-07 NOTE — Patient Instructions (Signed)
I value your feedback and you entrusting us with your care. If you get a  patient survey, I would appreciate you taking the time to let us know about your experience today. Thank you! ? ? ?

## 2020-09-08 LAB — COMPREHENSIVE METABOLIC PANEL
ALT: 19 IU/L (ref 0–32)
AST: 25 IU/L (ref 0–40)
Albumin/Globulin Ratio: 1.5 (ref 1.2–2.2)
Albumin: 4.4 g/dL (ref 3.8–4.8)
Alkaline Phosphatase: 69 IU/L (ref 44–121)
BUN/Creatinine Ratio: 18 (ref 9–23)
BUN: 15 mg/dL (ref 6–24)
Bilirubin Total: 0.2 mg/dL (ref 0.0–1.2)
CO2: 21 mmol/L (ref 20–29)
Calcium: 9.4 mg/dL (ref 8.7–10.2)
Chloride: 105 mmol/L (ref 96–106)
Creatinine, Ser: 0.84 mg/dL (ref 0.57–1.00)
GFR calc Af Amer: 100 mL/min/{1.73_m2} (ref >59)
GFR calc non Af Amer: 87 mL/min/{1.73_m2} (ref >59)
Globulin, Total: 2.9 g/dL (ref 1.5–4.5)
Glucose: 83 mg/dL (ref 65–99)
Potassium: 4.3 mmol/L (ref 3.5–5.2)
Sodium: 142 mmol/L (ref 134–144)
Total Protein: 7.3 g/dL (ref 6.0–8.5)

## 2020-09-08 LAB — LIPID PANEL
Chol/HDL Ratio: 3.1 ratio (ref 0.0–4.4)
Cholesterol, Total: 180 mg/dL (ref 100–199)
HDL: 58 mg/dL (ref >39)
LDL Chol Calc (NIH): 112 mg/dL — ABNORMAL HIGH (ref 0–99)
Triglycerides: 51 mg/dL (ref 0–149)
VLDL Cholesterol Cal: 10 mg/dL (ref 5–40)

## 2020-09-08 LAB — HEMOGLOBIN A1C
Est. average glucose Bld gHb Est-mCnc: 117 mg/dL
Hgb A1c MFr Bld: 5.7 % — ABNORMAL HIGH (ref 4.8–5.6)

## 2020-09-11 LAB — CYTOLOGY - PAP
Adequacy: ABSENT
Comment: NEGATIVE
High risk HPV: NEGATIVE

## 2020-10-03 ENCOUNTER — Other Ambulatory Visit: Payer: BC Managed Care – PPO

## 2020-10-11 ENCOUNTER — Ambulatory Visit
Admission: RE | Admit: 2020-10-11 | Discharge: 2020-10-11 | Disposition: A | Discharge status: Home / Self Care | Payer: BC Managed Care – PPO | Source: Ambulatory Visit | Attending: Obstetrics and Gynecology | Admitting: Obstetrics and Gynecology

## 2020-10-11 ENCOUNTER — Other Ambulatory Visit: Payer: Self-pay

## 2020-10-11 ENCOUNTER — Encounter: Payer: Self-pay | Admitting: Obstetrics and Gynecology

## 2020-10-11 DIAGNOSIS — N6489 Other specified disorders of breast: Secondary | ICD-10-CM | POA: Diagnosis not present

## 2020-10-11 DIAGNOSIS — Z1231 Encounter for screening mammogram for malignant neoplasm of breast: Secondary | ICD-10-CM | POA: Diagnosis not present

## 2020-10-11 DIAGNOSIS — R928 Other abnormal and inconclusive findings on diagnostic imaging of breast: Secondary | ICD-10-CM | POA: Diagnosis not present

## 2020-10-11 DIAGNOSIS — N6312 Unspecified lump in the right breast, upper inner quadrant: Secondary | ICD-10-CM | POA: Diagnosis not present

## 2020-10-25 DIAGNOSIS — Z79899 Other long term (current) drug therapy: Secondary | ICD-10-CM | POA: Diagnosis not present

## 2020-10-25 DIAGNOSIS — Z111 Encounter for screening for respiratory tuberculosis: Secondary | ICD-10-CM | POA: Diagnosis not present

## 2020-10-25 DIAGNOSIS — H209 Unspecified iridocyclitis: Secondary | ICD-10-CM | POA: Diagnosis not present

## 2020-10-25 DIAGNOSIS — D869 Sarcoidosis, unspecified: Secondary | ICD-10-CM | POA: Diagnosis not present

## 2020-12-13 MED ORDER — HUMIRA PEN CITRATE FREE 40 MG/0.4 ML
SUBCUTANEOUS | 7 refills | 28.00000 days
Start: 2020-12-13 — End: ?

## 2021-03-29 MED ORDER — EMPTY CONTAINER
3 refills | 0 days
Start: 2021-03-29 — End: ?

## 2021-03-29 NOTE — Unmapped (Addendum)
[facility] Specialty Medication Onboarding    Specialty Medication: Humira CF Pen 40mg /0.10ml   Prior Authorization: Not Required   Financial Assistance: Yes - copay card approved as secondary   Final Copay/Day Supply: $0 / 28 days    Insurance Restrictions: Yes - max 1 month supply     Notes to Pharmacist:     The triage team has completed the benefits investigation and has determined that the patient is able to fill this medication at [facility]. Please contact the patient to complete the onboarding or follow up with the prescribing physician as needed.

## 2021-03-29 NOTE — Unmapped (Signed)
The patient declined counseling on medication administration, missed dose instructions, goals of therapy, side effects and monitoring parameters, warnings and precautions, drug/food interactions and storage, handling precautions, and disposal because they have taken the medication previously. The information in the declined sections below are for informational purposes only and was not discussed with patient.       [facility]   Patient Onboarding/Medication Counseling    Peggy Rojas is a 40 y.o. 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  I am speaking to the patient.    Was a Nurse, learning disability used for this call? No    Verified patient's date of birth / HIPAA.    Specialty medication(s) to be sent: Inflammatory Disorders: Humira      Non-specialty medications/supplies to be sent: sharps      Medications not needed at this time: n/a         Humira (adalimumab)    Medication & Administration     Dosage: Uveitis: Inject 80mg  under the skin on day 1, then 40mg  every 14 days starting on day 8    Lab tests required prior to treatment initiation: Documentation located in media 03/28/21  ??? Tuberculosis: Tuberculosis screening not documented in the patient's chart but medication prescriber has indicated they are aware and wishing to initiate treatment at this time.  ??? Hepatitis B: Hepatitis B serology studies are not documented in the patient's chart but medication prescriber has indicated they are aware and wishing to initiate treatment at this time.    Administration:     Prefilled auto-injector pen  1. Gather all supplies needed for injection on a clean, flat working surface: medication pen removed from packaging, alcohol swab, sharps container, etc.  2. Look at the medication label - look for correct medication, correct dose, and check the expiration date  3. Look at the medication - the liquid visible in the window on the side of the pen device should appear clear and colorless  4. Lay the auto-injector pen on a flat surface and allow it to warm up to room temperature for at least 30-45 minutes  5. Select injection site - you can use the front of your thigh or your belly (but not the area 2 inches around your belly button); if someone else is giving you the injection you can also use your upper arm in the skin covering your triceps muscle  6. Prepare injection site - wash your hands and clean the skin at the injection site with an alcohol swab and let it air dry, do not touch the injection site again before the injection  7. Pull the 2 safety caps straight off - gray/white to uncover the needle cover and the plum cap to uncover the plum activator button, do not remove until immediately prior to injection and do not touch the white needle cover  8. Gently squeeze the area of cleaned skin and hold it firmly to create a firm surface at the selected injection site  9. Put the white needle cover against your skin at the injection site at a 90 degree angle, hold the pen such that you can see the clear medication window  10. Press down and hold the pen firmly against your skin, press the plum activator button to initiate the injection, there will be a click when the injection starts  11. Continue to hold the pen firmly against your skin for about 10-15 seconds - the window will start to turn  solid yellow  12. To verify the injection is complete after 10-15 seconds, look and ensure the window is solid yellow and then pull the pen away from your skin  13. Dispose of the used auto-injector pen immediately in your sharps disposal container the needle will be covered automatically  14. If you see any blood at the injection site, press a cotton ball or gauze on the site and maintain pressure until the bleeding stops, do not rub the injection site    Adherence/Missed dose instructions:  If your injection is given more than 3 days after your scheduled injection date - consult your pharmacist for additional instructions on how to adjust your dosing schedule.    Goals of Therapy     - Minimize chronic ocular inflammation  - Prevent damage to or loss of vision  - Minimize long-term use of systemic glucocorticoid use  - Prevention of disease flares  - Maintenance of effective psychosocial functioning    Side Effects & Monitoring Parameters     ??? Injection site reaction (redness, irritation, inflammation localized to the site of administration)  ??? Signs of a common cold - minor sore throat, runny or stuffy nose, etc.  ??? Upset stomach  ??? Headache    The following side effects should be reported to the provider:  ??? Signs of a hypersensitivity reaction - rash; hives; itching; red, swollen, blistered, or peeling skin; wheezing; tightness in the chest or throat; difficulty breathing, swallowing, or talking; swelling of the mouth, face, lips, tongue, or throat; etc.  ??? Reduced immune function - report signs of infection such as fever; chills; body aches; very bad sore throat; ear or sinus pain; cough; more sputum or change in color of sputum; pain with passing urine; wound that will not heal, etc.  Also at a slightly higher risk of some malignancies (mainly skin and blood cancers) due to this reduced immune function.  o In the case of signs of infection - the patient should hold the next dose of Humira?? and call your primary care provider to ensure adequate medical care.  Treatment may be resumed when infection is treated and patient is asymptomatic.  ??? Changes in skin - a new growth or lump that forms; changes in shape, size, or color of a previous mole or marking  ??? Signs of unexplained bruising or bleeding - throwing up blood or emesis that looks like coffee grounds; black, tarry, or bloody stool; etc.  ??? Signs of new or worsening heart failure - shortness of breath; sudden weight gain; heartbeat that is not normal; swelling in the arms or legs that is new or worse      Contraindications, Warnings, & Precautions     ??? Have your bloodwork checked as you have been told by your prescriber  ??? Talk with your doctor if you are pregnant, planning to become pregnant, or breastfeeding  ??? Discuss the possible need for holding your dose(s) of Humira?? when a planned procedure is scheduled with the prescriber as it may delay healing/recovery timeline       Drug/Food Interactions     ??? Medication list reviewed in Epic. The patient was instructed to inform the care team before taking any new medications or supplements. No drug interactions identified.   ??? Talk with you prescriber or pharmacist before receiving any live vaccinations while taking this medication and after you stop taking it    Storage, Handling Precautions, & Disposal     ??? Store this medication  in the refrigerator.  Do not freeze  ??? If needed, you may store at room temperature for up to 14 days  ??? Store in original packaging, protected from light  ??? Do not shake  ??? Dispose of used syringes/pens in a sharps disposal container    Current Medications (including OTC/herbals), Comorbidities and Allergies     Current Outpatient Medications   Medication Sig Dispense Refill   ??? HUMIRA PEN CITRATE FREE 40 MG/0.4 ML Inject 0.4 mL (40 mg total) under the skin every fourteen (14) days. 2 each 7     No current facility-administered medications for this visit.       Not on File    There is no problem list on file for this patient.      Reviewed and up to date in Epic.    Appropriateness of Therapy     Acute infections noted within Epic:  No active infections  Patient reported infection: None    Is medication and dose appropriate based on diagnosis and infection status? Yes    Prescription has been clinically reviewed: Yes      Baseline Quality of Life Assessment      How many days over the past month did your condition  keep you from your normal activities? For example, brushing your teeth or getting up in the morning. 0    Financial Information     Medication Assistance provided: None Required    Anticipated copay of $0 reviewed with patient. Verified delivery address.    Delivery Information     Scheduled delivery date: 04/13/21    Expected start date: 05/11/21    Medication will be delivered via Same Day Courier to the prescription address in [facility].  This shipment will not require a signature.      Explained the services we provide at [facility] and that each month we would call to set up refills.  Stressed importance of returning phone calls so that we could ensure they receive their medications in time each month.  Informed patient that we should be setting up refills 7-10 days prior to when they will run out of medication.  A pharmacist will reach out to perform a clinical assessment periodically.  Informed patient that a welcome packet, containing information about our pharmacy and other support services, a Notice of Privacy Practices, and a drug information handout will be sent.      The patient or caregiver noted above participated in the development of this care plan and knows that they can request review of or adjustments to the care plan at any time.      Patient or caregiver verbalized understanding of the above information as well as how to contact the pharmacy at [phone] option 4 with any questions/concerns.  The pharmacy is open Monday through Friday 8:30am-4:30pm.  A pharmacist is available 24/7 via pager to answer any clinical questions they may have.    Patient Specific Needs     - Does the patient have any physical, cognitive, or cultural barriers? No    - Does the patient have adequate living arrangements? (i.e. the ability to store and take their medication appropriately) Yes    - Did you identify any home environmental safety or security hazards? No    - Patient prefers to have medications discussed with  Patient     - Is the patient or caregiver able to read and understand education materials at a high school level or  above? Yes    - Patient's primary language is  English     - Is the patient high risk? No    - Does the patient require physician intervention or other additional services (i.e. dietary/nutrition, smoking cessation, social work)? No      Arnold Long  [facility] Specialty Pharmacist

## 2021-04-13 MED FILL — HUMIRA PEN CITRATE FREE 40 MG/0.4 ML: SUBCUTANEOUS | 28 days supply | Qty: 2 | Fill #0

## 2021-04-13 MED FILL — EMPTY CONTAINER: 120 days supply | Qty: 1 | Fill #0

## 2021-05-09 NOTE — Unmapped (Signed)
[facility] Refill Coordination Note    Specialty Medication(s) to be Shipped:   Inflammatory Disorders: Humira    Other medication(s) to be shipped: No additional medications requested for fill at this time     Clarity Ciszek, DOB: [birth year]  Phone: There are no phone numbers on file.      All above HIPAA information was verified with patient.     Was a Nurse, learning disability used for this call? No    Completed refill call assessment today to schedule patient's medication shipment from the [facility] [phone]).  All relevant notes have been reviewed.     Specialty medication(s) and dose(s) confirmed: Regimen is correct and unchanged.   Changes to medications: Denisia reports no changes at this time.  Changes to insurance: No  New side effects reported not previously addressed with a pharmacist or physician: None reported  Questions for the pharmacist: No    Confirmed patient received a Conservation officer, historic buildings and a Surveyor, mining with first shipment. The patient will receive a drug information handout for each medication shipped and additional FDA Medication Guides as required.       DISEASE/MEDICATION-SPECIFIC INFORMATION        For patients on injectable medications: Patient currently has 1 doses left.  Next injection is scheduled for 05/24/21.    SPECIALTY MEDICATION ADHERENCE     Medication Adherence    Patient reported X missed doses in the last month: 0  Specialty Medication: Humira  Patient is on additional specialty medications: No  Patient is on more than two specialty medications: No  Any gaps in refill history greater than 2 weeks in the last 3 months: no  Demonstrates understanding of importance of adherence: yes  Informant: patient  Reliability of informant: reliable              Were doses missed due to medication being on hold? No    Humira 40/0.4 mg/ml: 14 days of medicine on hand       REFERRAL TO PHARMACIST     Referral to the pharmacist: Not needed      [facility]     Shipping address confirmed in Epic.     Delivery Scheduled: Yes, Expected medication delivery date: 05/23/21.     Medication will be delivered via Same Day Courier to the prescription address in Epic WAM.    Threasa Beards   [facility] Specialty Technician

## 2021-05-23 MED FILL — HUMIRA PEN CITRATE FREE 40 MG/0.4 ML: SUBCUTANEOUS | 28 days supply | Qty: 2 | Fill #1

## 2021-06-18 NOTE — Unmapped (Signed)
[facility] Refill Coordination Note    Specialty Medication(s) to be Shipped:   Inflammatory Disorders: Humira    Other medication(s) to be shipped: No additional medications requested for fill at this time     Peggy Rojas, DOB: August 04, 1980  Phone: There are no phone numbers on file.      All above HIPAA information was verified with patient.     Was a Nurse, learning disability used for this call? No    Completed refill call assessment today to schedule patient's medication shipment from the [facility] [phone]).  All relevant notes have been reviewed.     Specialty medication(s) and dose(s) confirmed: Regimen is correct and unchanged.   Changes to medications: Gabbi reports no changes at this time.  Changes to insurance: No  New side effects reported not previously addressed with a pharmacist or physician: None reported  Questions for the pharmacist: No    Confirmed patient received a Conservation officer, historic buildings and a Surveyor, mining with first shipment. The patient will receive a drug information handout for each medication shipped and additional FDA Medication Guides as required.       DISEASE/MEDICATION-SPECIFIC INFORMATION        For patients on injectable medications: Patient currently has 1 doses left.  Next injection is scheduled for 12/08.    SPECIALTY MEDICATION ADHERENCE     Medication Adherence    Patient reported X missed doses in the last month: 0  Specialty Medication: Humira 40mg /0.57ml  Patient is on additional specialty medications: No  Patient is on more than two specialty medications: No  Any gaps in refill history greater than 2 weeks in the last 3 months: no  Demonstrates understanding of importance of adherence: yes  Informant: patient  Reliability of informant: reliable  Provider-estimated medication adherence level: good  Patient is at risk for Non-Adherence: No  Reasons for non-adherence: no problems identified  Confirmed plan for next specialty medication refill: delivery by pharmacy  Refills needed for supportive medications: not needed          Refill Coordination    Has the Patients' Contact Information Changed: No  Is the Shipping Address Different: No         Were doses missed due to medication being on hold? No      humira 40/0.4 mg/ml: 14 days of medicine on hand     REFERRAL TO PHARMACIST     Referral to the pharmacist: Not needed      [facility]     Shipping address confirmed in Epic.     Delivery Scheduled: Yes, Expected medication delivery date: 12/15.     Medication will be delivered via Same Day Courier to the prescription address in Epic WAM.    Antonietta Barcelona   [facility] Specialty Technician

## 2021-06-18 NOTE — Unmapped (Signed)
t

## 2021-06-28 MED FILL — HUMIRA PEN CITRATE FREE 40 MG/0.4 ML: SUBCUTANEOUS | 28 days supply | Qty: 2 | Fill #2

## 2021-07-18 NOTE — Unmapped (Signed)
[facility] Refill Coordination Note    Specialty Medication(s) to be Shipped:   Inflammatory Disorders: Humira    Other medication(s) to be shipped: No additional medications requested for fill at this time     Peggy Rojas, DOB: [birth year]  Phone: There are no phone numbers on file.      All above HIPAA information was verified with patient.     Was a Nurse, learning disability used for this call? No    Completed refill call assessment today to schedule patient's medication shipment from the [facility] [phone]).  All relevant notes have been reviewed.     Specialty medication(s) and dose(s) confirmed: Regimen is correct and unchanged.   Changes to medications: Kisa reports no changes at this time.  Changes to insurance: No  New side effects reported not previously addressed with a pharmacist or physician: None reported  Questions for the pharmacist: No    Confirmed patient received a Conservation officer, historic buildings and a Surveyor, mining with first shipment. The patient will receive a drug information handout for each medication shipped and additional FDA Medication Guides as required.       DISEASE/MEDICATION-SPECIFIC INFORMATION        For patients on injectable medications: Patient currently has 1 doses left.  Next injection is scheduled for 01/05.    SPECIALTY MEDICATION ADHERENCE     Medication Adherence    Patient reported X missed doses in the last month: 0  Specialty Medication: Humira 40mg /0.24ml  Patient is on additional specialty medications: No  Patient is on more than two specialty medications: No  Any gaps in refill history greater than 2 weeks in the last 3 months: no  Demonstrates understanding of importance of adherence: yes  Informant: patient  Reliability of informant: reliable  Provider-estimated medication adherence level: good  Patient is at risk for Non-Adherence: No  Reasons for non-adherence: no problems identified  Confirmed plan for next specialty medication refill: delivery by pharmacy  Refills needed for supportive medications: not needed          Refill Coordination    Has the Patients' Contact Information Changed: No  Is the Shipping Address Different: No         Were doses missed due to medication being on hold? No    humira 40/0.4 mg/ml: 14 days of medicine on hand         REFERRAL TO PHARMACIST     Referral to the pharmacist: Not needed      [facility]     Shipping address confirmed in Epic.     Delivery Scheduled: Yes, Expected medication delivery date: 01/12.     Medication will be delivered via Same Day Courier to the prescription address in Epic WAM.    Antonietta Barcelona   [facility] Specialty Technician

## 2021-07-26 MED FILL — HUMIRA PEN CITRATE FREE 40 MG/0.4 ML: SUBCUTANEOUS | 28 days supply | Qty: 2 | Fill #3

## 2021-08-13 NOTE — Unmapped (Signed)
[facility] Refill Coordination Note    Specialty Medication(s) to be Shipped:   Inflammatory Disorders: Humira    Other medication(s) to be shipped: No additional medications requested for fill at this time     Peggy Rojas, DOB: 11/08/1980  Phone: There are no phone numbers on file.      All above HIPAA information was verified with patient.     Was a Nurse, learning disability used for this call? No    Completed refill call assessment today to schedule patient's medication shipment from the [facility] [phone]).  All relevant notes have been reviewed.     Specialty medication(s) and dose(s) confirmed: Regimen is correct and unchanged.   Changes to medications: Peggy Rojas reports no changes at this time.  Changes to insurance: No  New side effects reported not previously addressed with a pharmacist or physician: None reported  Questions for the pharmacist: No    Confirmed patient received a Conservation officer, historic buildings and a Surveyor, mining with first shipment. The patient will receive a drug information handout for each medication shipped and additional FDA Medication Guides as required.       DISEASE/MEDICATION-SPECIFIC INFORMATION        For patients on injectable medications: Patient currently has 1 doses left.  Next injection is scheduled for 2/2.    SPECIALTY MEDICATION ADHERENCE     Medication Adherence    Patient reported X missed doses in the last month: 0  Specialty Medication: Humira  Patient is on additional specialty medications: No  Patient is on more than two specialty medications: No  Any gaps in refill history greater than 2 weeks in the last 3 months: no  Demonstrates understanding of importance of adherence: yes  Informant: patient              Were doses missed due to medication being on hold? No    Humira 40mg /0.25ml: Patient has 14 days of medication on hand     REFERRAL TO PHARMACIST     Referral to the pharmacist: Not needed      [facility]     Shipping address confirmed in Epic.     Delivery Scheduled: Yes, Expected medication delivery date: 2/9.     Medication will be delivered via Same Day Courier to the prescription address in Epic WAM.    Olga Millers   [facility] Specialty Technician

## 2021-08-23 MED FILL — HUMIRA PEN CITRATE FREE 40 MG/0.4 ML: SUBCUTANEOUS | 28 days supply | Qty: 2 | Fill #4

## 2021-09-04 ENCOUNTER — Other Ambulatory Visit: Payer: Self-pay | Admitting: Obstetrics and Gynecology

## 2021-09-04 DIAGNOSIS — Z3041 Encounter for surveillance of contraceptive pills: Secondary | ICD-10-CM

## 2021-09-06 NOTE — Progress Notes (Signed)
PCP:  [name], FNP   Chief Complaint  Patient presents with   Gynecologic Exam    No concerns     HPI:      [name] is a 41 y.o. G1P0 who LMP was Patient's last menstrual period was 09/07/2021 (exact date)., presents today for her annual examination.  Her menses are regular every 28-30 days, lasting 3-4 days with OCPs, light flow. Dysmenorrhea mild, occurring first 1-2 days of flow, relieved with NSAIDs. She does not have intermenstrual bleeding. Hx of leio.   Sex activity: single partner, contraception-OCPs. No pain/bleeding. Last Pap: 09/07/20 Results were LGSIL, neg HPV DNA. Repeat pap due today.  07/28/19 was neg cells/neg HPV DNA. S/p  1/20 Neg cells with POS HPV DNA with colpo and neg bx 08/13/18 with Dr. Georgianne Fick. Hx of ap with  2018  pap no abnormalities /POS HPV DNA with neg Colpo and bx  Hx of STDs: HPV  Last mammogram: 10/11/20 Results were cat 3; 09/17/19 Cat 4 RT breast, 3/21 bx showed [place]; Hx of bilat fibroadenomas removed with Dr. Jamal Collin 8/16.  There is a FH of breast cancer in her mat great great aunt, genetic testing not indicated. There is no FH of ovarian cancer. There is a FH pancreatic cancer in her MGF and pat cousin. The patient does do self-breast exams.   Tobacco use: The patient denies current or previous tobacco use. Alcohol use: social drinker No drug use.  Exercise: moderately active  She does get adequate calcium and Vitamin D in her diet.  Labs with PCP  Past Medical History:  Diagnosis Date   Fibroadenoma of both breasts    Pre-diabetes    Sarcoidosis    2009   Vitamin D deficiency     Past Surgical History:  Procedure Laterality Date   BREAST BIOPSY Right neg   2002 Dr. Larinda Buttery  excisional   BREAST BIOPSY Bilateral 03/06/2015   Procedure: BILATERAL BREAST MASS EXCISION, right breast mass excision with ultrasound guided needle localization ;  Surgeon: Christene Lye, MD;  Location: [facility] ORS;  Service: General;   Laterality: Bilateral;   BREAST BIOPSY Right 2021   coil clip, [place] BX,  PASH stromal fibrosis   COLONOSCOPY  2011   [place] OF UTERUS  2007   DILATION AND CURETTAGE OF UTERUS N/A 07/05/2017   Procedure: DILATATION AND CURETTAGE;  Surgeon: Homero Fellers, MD;  Location: [facility] OR[facility];  Service: Gynecology;  Laterality: N/A;   mass removed  2002   TONSILLECTOMY     UPPER GI ENDOSCOPY  2007   WISDOM TOOTH EXTRACTION  2006    Family History  Problem Relation Age of Onset   Heart disease Maternal Grandmother    Hypertension Maternal Grandmother    Pancreatic cancer Maternal Grandfather 34   Breast cancer Other 51   Pancreatic cancer Cousin        24s    Social History   Socioeconomic History   Marital status: Married    Spouse name: Not on file   Number of children: Not on file   Years of education: Not on file   Highest education level: Not on file  Occupational History   Not on file  Tobacco Use   Smoking status: Never   Smokeless tobacco: Never  Vaping Use   Vaping Use: Never used  Substance and Sexual Activity   Alcohol use: Yes    Alcohol/week: 0.0 standard drinks  Comment: Occasionally   Drug use: No   Sexual activity: Yes    Birth control/protection: Pill  Other Topics Concern   Not on file  Social History Narrative   Not on file   Social Determinants of Health   Financial Resource Strain: Not on file  Food Insecurity: Not on file  Transportation Needs: Not on file  Physical Activity: Not on file  Stress: Not on file  Social Connections: Not on file  Intimate Partner Violence: Not on file    Current Meds  Medication Sig   Adalimumab (HUMIRA PEN) 40 MG/0.4ML PNKT Inject into the skin.   Ascorbic Acid (VITAMIN C) 100 MG tablet Take 100 mg by mouth daily.   ferrous sulfate 325 (65 FE) MG EC tablet Take 325 mg by mouth 3 (three) times daily with meals.   ibuprofen (ADVIL,MOTRIN) 600 MG tablet Take by mouth.    Radiation protection practitioner (GUARDIAN SHARPS COLLECTOR) MISC Use as directed to dispose of injectable medications   Vitamin D, Ergocalciferol, (DRISDOL) 1.25 MG (50000 UNIT) CAPS capsule Take 50,000 Units by mouth once a week.   [DISCONTINUED] LARIN 24 FE 1-20 MG-MCG(24) tablet Take 1 tablet by mouth once daily     ROS:  Review of Systems  Constitutional:  Negative for fatigue, fever and unexpected weight change.  Respiratory:  Negative for cough, shortness of breath and wheezing.   Cardiovascular:  Negative for chest pain, palpitations and leg swelling.  Gastrointestinal:  Negative for blood in stool, constipation, diarrhea, nausea and vomiting.  Endocrine: Negative for cold intolerance, heat intolerance and polyuria.  Genitourinary:  Negative for dyspareunia, dysuria, flank pain, frequency, genital sores, hematuria, menstrual problem, pelvic pain, urgency, vaginal bleeding, vaginal discharge and vaginal pain.  Musculoskeletal:  Negative for back pain, joint swelling and myalgias.  Skin:  Negative for rash.  Neurological:  Negative for dizziness, syncope, light-headedness, numbness and headaches.  Hematological:  Negative for adenopathy.  Psychiatric/Behavioral:  Negative for agitation, confusion, sleep disturbance and suicidal ideas. The patient is not nervous/anxious.     Objective: BP 110/80    Ht 5\' 8"  (1.727 m)    Wt 252 lb (114.3 kg)    LMP 09/07/2021 (Exact Date)    BMI 38.32 kg/m    Physical Exam Constitutional:      Appearance: She is well-developed.  Genitourinary:     Vulva normal.     Right Labia: No rash, tenderness or lesions.    Left Labia: No tenderness, lesions or rash.    No vaginal discharge, erythema or tenderness.      Right Adnexa: not tender and no mass present.    Left Adnexa: not tender and no mass present.    No cervical motion tenderness, friability or polyp.     Uterus is not enlarged or tender.  Breasts:    Right: No mass, nipple discharge, skin change or  tenderness.     Left: No mass, nipple discharge, skin change or tenderness.  Neck:     Thyroid: No thyromegaly.  Cardiovascular:     Rate and Rhythm: Normal rate and regular rhythm.     Heart sounds: Normal heart sounds. No murmur heard. Pulmonary:     Effort: Pulmonary effort is normal.     Breath sounds: Normal breath sounds.  Abdominal:     Palpations: Abdomen is soft.     Tenderness: There is no abdominal tenderness. There is no guarding or rebound.  Musculoskeletal:        General: Normal range  of motion.     Cervical back: Normal range of motion.  Lymphadenopathy:     Cervical: No cervical adenopathy.  Neurological:     General: No focal deficit present.     Mental Status: She is alert and oriented to person, place, and time.     Cranial Nerves: No cranial nerve deficit.  Skin:    General: Skin is warm and dry.  Psychiatric:        Mood and Affect: Mood normal.        Behavior: Behavior normal.        Thought Content: Thought content normal.        Judgment: Judgment normal.  Vitals reviewed.    Assessment/Plan: Encounter for annual routine gynecological examination  Cervical cancer screening - Plan: Cytology - PAP  Screening for HPV (human papillomavirus) - Plan: Cytology - PAP  LGSIL on Pap smear of cervix - Plan: Cytology - PAP; repeat pap today, will f/u with results  Encounter for surveillance of contraceptive pills - Plan: Norethindrone Acetate-Ethinyl Estrad-FE (LARIN 24 FE) 1-20 MG-MCG(24) tablet; OCP RF  Encounter for screening mammogram for malignant neoplasm of breast - Plan: US BREAST LTD UNI RIGHT INC AXILLA, MM DIAG BREAST TOMO BILATERAL; pt to schedule mammo      Meds ordered this encounter  Medications   Norethindrone Acetate-Ethinyl Estrad-FE (LARIN 24 FE) 1-20 MG-MCG(24) tablet    Sig: Take 1 tablet by mouth daily.    Dispense:  84 tablet    Refill:  3    Order Specific Question:   Supervising Provider    Answer:   Gae Dry [[id number]]           GYN counsel breast self exam, adequate intake of calcium and vitamin D, diet and exercise     F/U  Return in about 1 year (around 09/10/2022).  Drina Jobst B. Dawt Reeb, [name] 09/10/2021 11:21 AM

## 2021-09-10 ENCOUNTER — Encounter: Payer: Self-pay | Admitting: Obstetrics and Gynecology

## 2021-09-10 ENCOUNTER — Other Ambulatory Visit: Payer: Self-pay

## 2021-09-10 ENCOUNTER — Other Ambulatory Visit (HOSPITAL_COMMUNITY)
Admission: RE | Admit: 2021-09-10 | Discharge: 2021-09-10 | Disposition: A | Discharge status: Home / Self Care | Payer: BC Managed Care – PPO | Source: Ambulatory Visit | Attending: Obstetrics and Gynecology | Admitting: Obstetrics and Gynecology

## 2021-09-10 ENCOUNTER — Ambulatory Visit (INDEPENDENT_AMBULATORY_CARE_PROVIDER_SITE_OTHER): Payer: 59 | Admitting: Obstetrics and Gynecology

## 2021-09-10 VITALS — BP 110/80 | Ht 68.0 in | Wt 252.0 lb

## 2021-09-10 DIAGNOSIS — Z01419 Encounter for gynecological examination (general) (routine) without abnormal findings: Secondary | ICD-10-CM

## 2021-09-10 DIAGNOSIS — R87612 Low grade squamous intraepithelial lesion on cytologic smear of cervix (LGSIL): Secondary | ICD-10-CM | POA: Diagnosis not present

## 2021-09-10 DIAGNOSIS — N6489 Other specified disorders of breast: Secondary | ICD-10-CM | POA: Insufficient documentation

## 2021-09-10 DIAGNOSIS — Z124 Encounter for screening for malignant neoplasm of cervix: Secondary | ICD-10-CM | POA: Insufficient documentation

## 2021-09-10 DIAGNOSIS — Z1151 Encounter for screening for human papillomavirus (HPV): Secondary | ICD-10-CM | POA: Insufficient documentation

## 2021-09-10 DIAGNOSIS — Z1231 Encounter for screening mammogram for malignant neoplasm of breast: Secondary | ICD-10-CM

## 2021-09-10 DIAGNOSIS — Z3041 Encounter for surveillance of contraceptive pills: Secondary | ICD-10-CM

## 2021-09-10 MED ORDER — LARIN 24 FE 1-20 MG-MCG(24) PO TABS: 1.0000 | ORAL_TABLET | Freq: Every day | ORAL | 3 refills | Status: DC

## 2021-09-10 NOTE — Patient Instructions (Addendum)
I value your feedback and you entrusting us with your care. If you get a [facility] patient survey, I would appreciate you taking the time to let us know about your experience today. Thank you!  [facility]: [phone]      

## 2021-09-12 LAB — CYTOLOGY - PAP
Comment: NEGATIVE
Diagnosis: UNDETERMINED — AB
High risk HPV: NEGATIVE

## 2021-09-13 NOTE — Unmapped (Signed)
[facility] Refill Coordination Note    Specialty Medication(s) to be Shipped:   Inflammatory Disorders: Humira    Other medication(s) to be shipped: No additional medications requested for fill at this time     Korina Tretter, DOB: [birth year]  Phone: There are no phone numbers on file.      All above HIPAA information was verified with patient.     Was a Nurse, learning disability used for this call? No    Completed refill call assessment today to schedule patient's medication shipment from the [facility] [phone]).  All relevant notes have been reviewed.     Specialty medication(s) and dose(s) confirmed: Regimen is correct and unchanged.   Changes to medications: Salome reports no changes at this time.  Changes to insurance: No  New side effects reported not previously addressed with a pharmacist or physician: None reported  Questions for the pharmacist: No    Confirmed patient received a Conservation officer, historic buildings and a Surveyor, mining with first shipment. The patient will receive a drug information handout for each medication shipped and additional FDA Medication Guides as required.       DISEASE/MEDICATION-SPECIFIC INFORMATION        For patients on injectable medications: Patient currently has 0 doses left.  Next injection is scheduled for 3/16.    SPECIALTY MEDICATION ADHERENCE     Medication Adherence    Patient reported X missed doses in the last month: 0  Specialty Medication: Humira  Patient is on additional specialty medications: No  Patient is on more than two specialty medications: No  Any gaps in refill history greater than 2 weeks in the last 3 months: no  Demonstrates understanding of importance of adherence: yes  Informant: patient              Were doses missed due to medication being on hold? No    Humira 40mg /0.45ml: Patient has 0 days of medication on hand    REFERRAL TO PHARMACIST     Referral to the pharmacist: Not needed      [facility]     Shipping address confirmed in Epic.     Delivery Scheduled: Yes, Expected medication delivery date: 3/9.     Medication will be delivered via Same Day Courier to the prescription address in Epic WAM.    Olga Millers   [facility] Specialty Technician

## 2021-09-20 MED FILL — HUMIRA PEN CITRATE FREE 40 MG/0.4 ML: SUBCUTANEOUS | 28 days supply | Qty: 2 | Fill #5

## 2021-10-10 NOTE — Unmapped (Signed)
[facility] Clinical Assessment & Refill Coordination Note    Peggy Rojas, DOB: Nov 06, 1980  Phone: There are no phone numbers on file.    All above HIPAA information was verified with patient.     Was a Nurse, learning disability used for this call? No    Specialty Medication(s):   Inflammatory Disorders: Humira     Current Outpatient Medications   Medication Sig Dispense Refill   ??? empty container Misc Use as directed to dispose of injectable medications 1 each 3   ??? HUMIRA PEN CITRATE FREE 40 MG/0.4 ML Inject the contents of 1 pen (40 mg total) under the skin every fourteen (14) days. 2 each 7   ??? ibuprofen (MOTRIN) 600 MG tablet Take by mouth every hour as needed.     ??? LARIN 24 FE 1 mg-20 mcg (24)/75 mg (4) Tab Take 1 tablet by mouth daily.       No current facility-administered medications for this visit.        Changes to medications: Vaishali reports no changes at this time.    No Known Allergies    Changes to allergies: No    SPECIALTY MEDICATION ADHERENCE         Medication Adherence    Patient reported X missed doses in the last month: 0  Specialty Medication: Humira q14d  Patient is on additional specialty medications: No  Informant: patient          Specialty medication(s) dose(s) confirmed: Regimen is correct and unchanged.     Are there any concerns with adherence? No    Adherence counseling provided? Not needed    CLINICAL MANAGEMENT AND INTERVENTION      Clinical Benefit Assessment:    Do you feel the medicine is effective or helping your condition? Yes    Clinical Benefit counseling provided? Not needed    Adverse Effects Assessment:    Are you experiencing any side effects? No    Are you experiencing difficulty administering your medicine? No    Quality of Life Assessment:    Quality of Life    Rheumatology  1. What impact has your specialty medication had on the reduction of your daily pain level?: Tremendous  2. What impact has your specialty medication had on your ability to complete daily tasks (prepare meals, get dressed, etc...)?Marland Kitchen Tremendous  Oncology  Dermatology  Cystic Fibrosis              Have you discussed this with your provider? Not needed    Acute Infection Status:    Acute infections noted within Epic:  No active infections  Patient reported infection: None    Therapy Appropriateness:    Is therapy appropriate and patient progressing towards therapeutic goals? Yes, therapy is appropriate and should be continued    DISEASE/MEDICATION-SPECIFIC INFORMATION      For patients on injectable medications: Patient currently has 1 doses left.  Next injection is scheduled for 3/30.    PATIENT SPECIFIC NEEDS     - Does the patient have any physical, cognitive, or cultural barriers? No    - Is the patient high risk? No    - Does the patient require a Care Management Plan? No     SOCIAL DETERMINANTS OF HEALTH     At the [facility], we have learned that life circumstances - like trouble affording food, housing, utilities, or transportation can affect the health of many of our patients.   That is  why we wanted to ask: are you currently experiencing any life circumstances that are negatively impacting your health and/or quality of life? No    Social Determinants of Psychologist, prison and probation services Strain: Not on file   Internet Connectivity: Not on file   Food Insecurity: Not on file   Tobacco Use: Not on file   Housing/Utilities: Not on file   Alcohol Use: Not on file   Transportation Needs: Not on file   Substance Use: Not on file   Health Literacy: Not on file   Physical Activity: Not on file   Interpersonal Safety: Not on file   Stress: Not on file   Intimate Partner Violence: Not on file   Depression: Not on file   Social Connections: Not on file       Would you be willing to receive help with any of the needs that you have identified today? Not applicable       SHIPPING     Specialty Medication(s) to be Shipped:   Inflammatory Disorders: Humira    Other medication(s) to be shipped: No additional medications requested for fill at this time     Changes to insurance: No    Delivery Scheduled: Yes, Expected medication delivery date: 4/5.     Medication will be delivered via Same Day Courier to the confirmed prescription address in [facility].    The patient will receive a drug information handout for each medication shipped and additional FDA Medication Guides as required.  Verified that patient has previously received a Conservation officer, historic buildings and a Surveyor, mining.    The patient or caregiver noted above participated in the development of this care plan and knows that they can request review of or adjustments to the care plan at any time.      All of the patient's questions and concerns have been addressed.    Julianne Rice   [facility] Specialty Pharmacist

## 2021-10-17 MED FILL — HUMIRA PEN CITRATE FREE 40 MG/0.4 ML: SUBCUTANEOUS | 28 days supply | Qty: 2 | Fill #6

## 2021-10-22 ENCOUNTER — Other Ambulatory Visit: Payer: BC Managed Care – PPO

## 2021-10-22 ENCOUNTER — Inpatient Hospital Stay: Admission: RE | Admit: 2021-10-22 | Payer: BC Managed Care – PPO | Source: Ambulatory Visit

## 2021-11-06 ENCOUNTER — Ambulatory Visit
Admission: RE | Admit: 2021-11-06 | Discharge: 2021-11-06 | Disposition: A | Discharge status: Home / Self Care | Payer: Commercial Managed Care - PPO | Source: Ambulatory Visit | Attending: Obstetrics and Gynecology | Admitting: Obstetrics and Gynecology

## 2021-11-06 DIAGNOSIS — Z1231 Encounter for screening mammogram for malignant neoplasm of breast: Secondary | ICD-10-CM | POA: Insufficient documentation

## 2021-11-12 NOTE — Unmapped (Signed)
[facility] Refill Coordination Note    Specialty Medication(s) to be Shipped:   Inflammatory Disorders: Humira    Other medication(s) to be shipped: No additional medications requested for fill at this time     Peggy Rojas, DOB: [birth year]  Phone: There are no phone numbers on file.      All above HIPAA information was verified with patient.     Was a Nurse, learning disability used for this call? No    Completed refill call assessment today to schedule patient's medication shipment from the [facility] [phone]).  All relevant notes have been reviewed.     Specialty medication(s) and dose(s) confirmed: Regimen is correct and unchanged.   Changes to medications: Peggy Rojas reports no changes at this time.  Changes to insurance: No  New side effects reported not previously addressed with a pharmacist or physician: None reported  Questions for the pharmacist: No    Confirmed patient received a Conservation officer, historic buildings and a Surveyor, mining with first shipment. The patient will receive a drug information handout for each medication shipped and additional FDA Medication Guides as required.       DISEASE/MEDICATION-SPECIFIC INFORMATION        For patients on injectable medications: Patient currently has 0 doses left.  Next injection is scheduled for 5/11.    SPECIALTY MEDICATION ADHERENCE     Medication Adherence    Patient reported X missed doses in the last month: 0  Specialty Medication: Humira              Were doses missed due to medication being on hold? No        REFERRAL TO PHARMACIST     Referral to the pharmacist: Not needed      [name]     Shipping address confirmed in Epic.     Delivery Scheduled: Yes, Expected medication delivery date: 5/4.     Medication will be delivered via Same Day Courier to the prescription address in Epic WAM.    [name]   [name]

## 2021-11-15 MED ORDER — HUMIRA PEN CITRATE FREE 40 MG/0.4 ML
SUBCUTANEOUS | 7 refills | 28.00000 days | Status: CN
Start: 2021-11-15 — End: ?

## 2021-11-15 MED FILL — HUMIRA PEN CITRATE FREE 40 MG/0.4 ML: SUBCUTANEOUS | 28 days supply | Qty: 2 | Fill #7

## 2021-11-16 MED ORDER — HUMIRA PEN CITRATE FREE 40 MG/0.4 ML
PACK | SUBCUTANEOUS | 11 refills | 14 days
Start: 2021-11-16 — End: ?

## 2021-12-07 NOTE — Unmapped (Signed)
[facility] Refill Coordination Note    Specialty Medication(s) to be Shipped:   Inflammatory Disorders: Humira    Other medication(s) to be shipped: No additional medications requested for fill at this time     Peggy Rojas, DOB: April 23, 1981  Phone: There are no phone numbers on file.      All above HIPAA information was verified with patient.     Was a Nurse, learning disability used for this call? No    Completed refill call assessment today to schedule patient's medication shipment from the [facility] [phone]).  All relevant notes have been reviewed.     Specialty medication(s) and dose(s) confirmed: Regimen is correct and unchanged.   Changes to medications: Mckenzey reports no changes at this time.  Changes to insurance: No  New side effects reported not previously addressed with a pharmacist or physician: None reported  Questions for the pharmacist: No    Confirmed patient received a Conservation officer, historic buildings and a Surveyor, mining with first shipment. The patient will receive a drug information handout for each medication shipped and additional FDA Medication Guides as required.       DISEASE/MEDICATION-SPECIFIC INFORMATION        For patients on injectable medications: Patient currently has 0 doses left.  Next injection is scheduled for 6/8.    SPECIALTY MEDICATION ADHERENCE     Medication Adherence    Patient reported X missed doses in the last month: 0  Specialty Medication: Humira  Patient is on additional specialty medications: No  Patient is on more than two specialty medications: No  Any gaps in refill history greater than 2 weeks in the last 3 months: no  Demonstrates understanding of importance of adherence: yes  Informant: patient              Were doses missed due to medication being on hold? No    Humira 40mg /0.103ml: Patient has 0 days of medication on hand    REFERRAL TO PHARMACIST     Referral to the pharmacist: Not needed      [facility]     Shipping address confirmed in Epic.     Delivery Scheduled: Yes, Expected medication delivery date: 6/1.     Medication will be delivered via Same Day Courier to the prescription address in Epic WAM.    Olga Millers   [facility] Specialty Technician

## 2021-12-13 MED FILL — HUMIRA PEN CITRATE FREE 40 MG/0.4 ML: SUBCUTANEOUS | 28 days supply | Qty: 2 | Fill #0

## 2022-01-04 NOTE — Unmapped (Signed)
[facility] Refill Coordination Note    Specialty Medication(s) to be Shipped:   Inflammatory Disorders: Humira    Other medication(s) to be shipped: No additional medications requested for fill at this time     Peggy Rojas, DOB: October 19, 1980  Phone: There are no phone numbers on file.      All above HIPAA information was verified with patient.     Was a Nurse, learning disability used for this call? No    Completed refill call assessment today to schedule patient's medication shipment from the [facility] [phone]).  All relevant notes have been reviewed.     Specialty medication(s) and dose(s) confirmed: Regimen is correct and unchanged.   Changes to medications: Vernica reports no changes at this time.  Changes to insurance: No  New side effects reported not previously addressed with a pharmacist or physician: None reported  Questions for the pharmacist: No    Confirmed patient received a Conservation officer, historic buildings and a Surveyor, mining with first shipment. The patient will receive a drug information handout for each medication shipped and additional FDA Medication Guides as required.       DISEASE/MEDICATION-SPECIFIC INFORMATION        For patients on injectable medications: Patient currently has 0 doses left.  Next injection is scheduled for 7/6.    SPECIALTY MEDICATION ADHERENCE     Medication Adherence    Patient reported X missed doses in the last month: 0  Specialty Medication: Humira  Patient is on additional specialty medications: No  Patient is on more than two specialty medications: No  Any gaps in refill history greater than 2 weeks in the last 3 months: no  Demonstrates understanding of importance of adherence: yes  Informant: patient              Were doses missed due to medication being on hold? No    Humira 40mg /0.19ml: Patient has 0 days of medication on hand    REFERRAL TO PHARMACIST     Referral to the pharmacist: Not needed      [facility]     Shipping address confirmed in Epic.     Delivery Scheduled: Yes, Expected medication delivery date: 6/30.     Medication will be delivered via Same Day Courier to the prescription address in Epic WAM.    Olga Millers   [facility] Specialty Technician

## 2022-01-11 DIAGNOSIS — H209 Unspecified iridocyclitis: Principal | ICD-10-CM

## 2022-01-11 MED FILL — HUMIRA PEN CITRATE FREE 40 MG/0.4 ML: SUBCUTANEOUS | 28 days supply | Qty: 2 | Fill #1

## 2022-02-01 NOTE — Unmapped (Signed)
[facility] Refill Coordination Note    Specialty Medication(s) to be Shipped:   Inflammatory Disorders: Humira    Other medication(s) to be shipped: No additional medications requested for fill at this time     Peggy Rojas, DOB: 1980-09-19  Phone: There are no phone numbers on file.      All above HIPAA information was verified with patient.     Was a Nurse, learning disability used for this call? No    Completed refill call assessment today to schedule patient's medication shipment from the [facility] [phone]).  All relevant notes have been reviewed.     Specialty medication(s) and dose(s) confirmed: Regimen is correct and unchanged.   Changes to medications: Zoey reports no changes at this time.  Changes to insurance: No  New side effects reported not previously addressed with a pharmacist or physician: None reported  Questions for the pharmacist: No    Confirmed patient received a Conservation officer, historic buildings and a Surveyor, mining with first shipment. The patient will receive a drug information handout for each medication shipped and additional FDA Medication Guides as required.       DISEASE/MEDICATION-SPECIFIC INFORMATION        For patients on injectable medications: Patient currently has 0 doses left.  Next injection is scheduled for 8/3.    SPECIALTY MEDICATION ADHERENCE     Medication Adherence    Patient reported X missed doses in the last month: 0  Specialty Medication: Humira  Patient is on additional specialty medications: No  Patient is on more than two specialty medications: No  Any gaps in refill history greater than 2 weeks in the last 3 months: no  Demonstrates understanding of importance of adherence: yes  Informant: patient              Were doses missed due to medication being on hold? No    Humira 40mg /0.60ml: Patient has 0 days of medication on hand    REFERRAL TO PHARMACIST     Referral to the pharmacist: Not needed      [facility]     Shipping address confirmed in Epic.     Delivery Scheduled: Yes, Expected medication delivery date: 7/28.     Medication will be delivered via Same Day Courier to the prescription address in Epic WAM.    Olga Millers   [facility] Specialty Technician

## 2022-02-07 ENCOUNTER — Ambulatory Visit
Admit: 2022-02-07 | Discharge: 2022-02-08 | Payer: Worker's Compensation | Attending: Family Medicine | Primary: Family Medicine

## 2022-02-07 DIAGNOSIS — Z6841 Body Mass Index (BMI) 40.0 and over, adult: Principal | ICD-10-CM

## 2022-02-07 DIAGNOSIS — I10 Essential (primary) hypertension: Principal | ICD-10-CM

## 2022-02-07 DIAGNOSIS — E8881 Metabolic syndrome: Principal | ICD-10-CM

## 2022-02-07 LAB — HEMOGLOBIN A1C
ESTIMATED AVERAGE GLUCOSE: 111 mg/dL
HEMOGLOBIN A1C: 5.5 % (ref 4.8–5.6)

## 2022-02-07 LAB — COMPREHENSIVE METABOLIC PANEL
ALBUMIN: 3.8 g/dL (ref 3.4–5.0)
ALKALINE PHOSPHATASE: 59 U/L (ref 46–116)
ALT (SGPT): 24 U/L (ref 10–49)
ANION GAP: 7 mmol/L (ref 5–14)
AST (SGOT): 23 U/L (ref <=34)
BILIRUBIN TOTAL: 0.2 mg/dL — ABNORMAL LOW (ref 0.3–1.2)
BLOOD UREA NITROGEN: 11 mg/dL (ref 9–23)
BUN / CREAT RATIO: 15
CALCIUM: 9.7 mg/dL (ref 8.7–10.4)
CHLORIDE: 109 mmol/L — ABNORMAL HIGH (ref 98–107)
CO2: 25.4 mmol/L (ref 20.0–31.0)
CREATININE: 0.75 mg/dL
EGFR CKD-EPI (2021) FEMALE: >90 mL/min/{1.73_m2} (ref >=60)
GLUCOSE RANDOM: 87 mg/dL (ref 70–179)
POTASSIUM: 3.9 mmol/L (ref 3.4–4.8)
PROTEIN TOTAL: 7.6 g/dL (ref 5.7–8.2)
SODIUM: 141 mmol/L (ref 135–145)

## 2022-02-07 LAB — LIPID PANEL
CHOLESTEROL/HDL RATIO SCREEN: 3 (ref 1.0–4.5)
CHOLESTEROL: 167 mg/dL (ref <=200)
HDL CHOLESTEROL: 56 mg/dL (ref 40–60)
LDL CHOLESTEROL CALCULATED: 95 mg/dL (ref 40–99)
NON-HDL CHOLESTEROL: 111 mg/dL (ref 70–130)
TRIGLYCERIDES: 80 mg/dL (ref 0–150)
VLDL CHOLESTEROL CAL: 16 mg/dL (ref 9–37)

## 2022-02-07 LAB — TSH: THYROID STIMULATING HORMONE: 1.103 u[IU]/mL (ref 0.550–4.780)

## 2022-02-07 MED ORDER — WEGOVY 0.25 MG/0.5 ML SUBCUTANEOUS PEN INJECTOR
SUBCUTANEOUS | 2 refills | 0 days | Status: CP
Start: 2022-02-07 — End: ?
  Filled 2022-03-01: qty 4, 28d supply, fill #0

## 2022-02-07 MED ORDER — METFORMIN ER 500 MG TABLET,EXTENDED RELEASE 24 HR
ORAL_TABLET | Freq: Two times a day (BID) | ORAL | 3 refills | 60 days | Status: CP
Start: 2022-02-07 — End: ?

## 2022-02-07 MED ORDER — CHLORTHALIDONE 25 MG TABLET
ORAL_TABLET | Freq: Every morning | ORAL | 3 refills | 90 days | Status: CP
Start: 2022-02-07 — End: ?

## 2022-02-07 NOTE — Unmapped (Signed)
Today's BP 160/97 (confirmed manually). New Dx of HTN. Start Chlorthalidone 25 mg once daily in the morning.     Pt is establishing with our WM clinic today. We will work on aggressive weight loss with pharmacotherapy and lifestyle modifications--See Class 3 obesity tab. Pt verbalized understanding and agreement with plan.

## 2022-02-07 NOTE — Unmapped (Signed)
Mild acanthosis nigricans on PE. Pt is establishing with our WM clinic today. We will work on aggressive weight loss with pharmacotherapy and lifestyle modifications--See Class 3 obesity tab. Pt verbalized understanding and agreement with plan.

## 2022-02-07 NOTE — Unmapped (Addendum)
Shia Lhotka is a 41 y.o. female with Class 3, Stage 1 obesity due to weight gain in her 30s caused by unhealthy lifestyle, including physical inactivity and frequent consumption of ultraprocessed foods and products with high amounts of added sugars. Comorbidities include insulin resistance, vitamin D deficiency. Barriers include nothing specific at this time.    Weight Summary:  Starting weight/BMI/WC/[place]: 257 lbs, BMI 40.82, WC 43,  [place] 13 (02/07/2022)  Target weight/goal: No goal weight. Patient wants general improvement in health and appearance.  Today's weight/BMI: (!) 116.6 kg (257 lb), BMI (!) 40.82 (02/07/2022)   % Body weight loss: N/A  Today's Waist Circumference: 43 inches (02/07/2022)  Today's visit number: 1st  Duration in program: NA    PCP is in [employer] through [employer] (private practice.)     Referred by: A friend who is also our patient.    GOALS: 1) Start going to gym 2x/week to do 30 mins cardio and 30 mins weights  2) Swap fruit rollup and chips to string cheese and skinny pop  3) Swap fruit drink for water or Crystal Light  4) Swap white bread for whole grain/wheat bread  5) limit going out to eat    I have reviewed the patient's medical history, lifestyle history and labs/tests.   My recommendations include the following:    Eating Pattern: Making some healthy choices including cooking most meals at home. Room for improvement in daily juice and snacking on potato chips and fruit roll-ups. Discussed healthier alternatives and provided Pt with nutrition educational resources to support healthy lifestyle.  -- See GOALS above.    Physical Activity: No current routine. Pt wishes to start going to gym.  -- See GOALS above.    Sleep: Currently sleeping 7 hours without sleep aids. STOP-BANG negative (2/8).  -- Recommend 7-8 hours of sleep at night.    Stress management: Pt reports having good stress management techniques such as reading.     Weight gain causing medication: None.    Medication: We discussed and decided to trial Metformin. Instruction given; side effect profile discussed. Prescription sent. Also sending for [facility] to see if covered. Sent to [employer]. Pt verbalized understanding and agreed to plan. Will follow.   START Metformin 500 mg twice a day with meals, if covered, START Wegovy 0.25 mg once weekly injections  - Wegovy: maybe started 02/07/22 at 257 lbs  - Metformin: started 02/07/2022 at 257 lbs.  Tried/Contraindicated:  - Phentermine: WL of 19 lbs, couldn't tolerate d/t headaches and insomnia    Obesity Surgery: Informed Pt about surgical options. Declines for now. Wishes to try medical management but will consider in the future. Pt verbalized that if Reginal Lutes is not covered, she would like to seriously consider surgery. (Updated 02/07/2022)    Medical conditions:  Glaucoma: no  Seizures: no  Medullary thyroid cancer (personal or family hx): no  Multiple Endocrine Neoplasia: no  Palpitations/Tachycardia: no  Chest Pain: no past history of MI.   Headaches/Migraines: no  Nephrolithiasis: no  H/o pancreatitis: no  GERD: no     Prior Surgeries:  Cholecystectomy: no  Hysterectomy: no     Birth Control Methods: OCP

## 2022-02-07 NOTE — Unmapped (Signed)
[facility]    Assessment/Plan:     Problem List Items Addressed This Visit        Unprioritized    Class 3 severe obesity without serious comorbidity with body mass index (BMI) of 40.0 to 44.9 in adult (CMS-HCC)     Peggy Rojas is a 41 y.o. female with Class 3, Stage 1 obesity due to weight gain in her 30s caused by unhealthy lifestyle, including physical inactivity and frequent consumption of ultraprocessed foods and products with high amounts of added sugars. Comorbidities include insulin resistance, vitamin D deficiency. Barriers include nothing specific at this time.    Weight Summary:  1. Starting weight/BMI/WC/[place]: 257 lbs, BMI 40.82, WC 43, [place] 13 (02/07/2022)  2. Target weight/goal: No goal weight. Patient wants general improvement in health and appearance.  3. Today's weight/BMI: (!) 116.6 kg (257 lb), BMI (!) 40.82 (02/07/2022)   4. % Body weight loss: N/A  5. Today's Waist Circumference: 43 inches (02/07/2022)  6. Today's visit number: 1st  7. Duration in program: NA    PCP is in [employer] through [employer] (private practice.)     Referred by: A friend who is also our patient.    GOALS: 1) Start going to gym 2x/week to do 30 mins cardio and 30 mins weights  2) Swap fruit rollup and chips to string cheese and skinny pop  3) Swap fruit drink for water or Crystal Light  4) Swap white bread for whole grain/wheat bread  5) limit going out to eat    I have reviewed the patient's medical history, lifestyle history and labs/tests.   My recommendations include the following:    Eating Pattern: Making some healthy choices including cooking most meals at home. Room for improvement in daily juice and snacking on potato chips and fruit roll-ups. Discussed healthier alternatives and provided Pt with nutrition educational resources to support healthy lifestyle.  -- See GOALS above.    Physical Activity: No current routine. Pt wishes to start going to gym.  -- See GOALS above.    Sleep: Currently sleeping 7 hours without sleep aids. STOP-BANG negative (2/8).  -- Recommend 7-8 hours of sleep at night.    Stress management: Pt reports having good stress management techniques such as reading.     Weight gain causing medication: None.    Medication: We discussed and decided to trial Metformin. Instruction given; side effect profile discussed. Prescription sent. Also sending for [facility] to see if covered. Sent to [employer]. Pt verbalized understanding and agreed to plan. Will follow.   START Metformin 500 mg twice a day with meals, if covered, START Wegovy 0.25 mg once weekly injections  - Wegovy: maybe started 02/07/22 at 257 lbs  - Metformin: started 02/07/2022 at 257 lbs.  Tried/Contraindicated:  - Phentermine: WL of 19 lbs, couldn't tolerate d/t headaches and insomnia    Obesity Surgery: Informed Pt about surgical options. Declines for now. Wishes to try medical management but will consider in the future. Pt verbalized that if Reginal Lutes is not covered, she would like to seriously consider surgery. (Updated 02/07/2022)    Medical conditions:  Glaucoma: no  Seizures: no  Medullary thyroid cancer (personal or family hx): no  Multiple Endocrine Neoplasia: no  Palpitations/Tachycardia: no  Chest Pain: no past history of MI.   Headaches/Migraines: no  Nephrolithiasis: no  H/o pancreatitis: no  GERD: no     Prior Surgeries:  Cholecystectomy: no  Hysterectomy: no  Birth Control Methods: OCP         Relevant Medications    WEGOVY 0.25 MG/0.5 ML SUBCUTANEOUS PEN INJECTOR    Other Relevant Orders    TSH (Completed)    Lipid Panel (Completed)    Hemoglobin A1c (Completed)    Comprehensive Metabolic Panel (Completed)    Essential hypertension     Today's BP 160/97 (confirmed manually). New Dx of HTN. Start Chlorthalidone 25 mg once daily in the morning.     Pt is establishing with our WM clinic today. We will work on aggressive weight loss with pharmacotherapy and lifestyle modifications--See Class 3 obesity tab. Pt verbalized understanding and agreement with plan.           Relevant Medications    chlorthalidone (HYGROTON) 25 MG tablet    Insulin resistance - Primary     Mild acanthosis nigricans on PE. Pt is establishing with our WM clinic today. We will work on aggressive weight loss with pharmacotherapy and lifestyle modifications--See Class 3 obesity tab. Pt verbalized understanding and agreement with plan.           Relevant Medications    metFORMIN (GLUCOPHAGE-XR) 500 MG 24 hr tablet   Other Visit Diagnoses     Primary hypertension        Relevant Medications    chlorthalidone (HYGROTON) 25 MG tablet           Return in about 2 months (around 04/10/2022) for Weight clinic F/U one slot. 2nd visit, every two months iwth me, dietician appt too.    Counseling     I spent a total of 60 minutes face-to-face with the patient, of which greater than 50% was spent counseling/coordinating care regarding obesity and management of above comorbidities. All questions answered. I have reviewed pt's lab results and medical records.    HPI:     Peggy Rojas is a 41 y.o. female Body mass index is 40.82 kg/m??. Waist Circumference: 43 inches Neck Circumference: 13; seen at the request of Self, Referred    Motivations and goals      02/06/2022     2:14 PM   Intake Questions   What is your main reason for obesity treatment? I feel that weight loss will allow me to be more active   What treatments are you interested in pursuing? Lifestyle change with medications to help weight loss   Overall goal - A.) I would like to get down to _____ lbs. B.) I do not have a weight goal. I mainly want to feel better 50 to 60lbs        Weight History:      02/06/2022     2:14 PM   Weight History   What was your highest adult weight? (lbs) 255   What was your lowest adult weight on a diet or weight loss program? (lbs) 187   What was your lowest usual lowest adult weight? (lbs) 140   Please describe when and how you started gaining weight It seemed to come alot with as I'm getting older.      Weight Hx: Normal birth and childhood weight. Pt started struggling with weight in her mid 30s. Notes that her physical activity decreased because she was working at a sedentary job. Also attributes weight gain to poor food choices.    Family Hx: Positive family history. Mother also has obesity and diabetes. Has lost 100 lbs on ?GLP1. Pt's cousin had gastric sleeve and Pt reports she has  done well.    Physical Activity: No formal routine. Pt has a gym but does not currently go.    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 No cigarettes or EtOH.    Waist Circumference: 43 inches  Neck Circumference: 13    Dietary:     Eating Pattern: Pt cooks. Going out to eat 1-2x/week ([employer]obster, Chipotle, Chikfila, Olive Garden)    Breakfast: eggs or tuna creation  Lunch: leftovers (chicken), or goes out to [employer]: pt typically cooks (fried or baked chicken, pasta, steak or hamburgers)    Snacking on a fruit roll up before bed. Typically snacking on a small bag of potato chips during the day.    Beverages: 2 cups of fruit punch per day, occ sweet tea when going out to dinner.    Dieting History:      02/06/2022     2:14 PM   Diet History   Are you currently working with a Registered Dietitian? No   Breakfast Eggs bacon toast   Afternoon Snack Chips   Dinner Hamburger dip and chips   After [employer]   Do you frequently feel hungry within 2 hours of having a regular size meal? Yes   Do you eat at times when you are not hungry? Yes   Do you eat for comfort when you are stressed or emotional? No   Are there times when you eat and it feels like you can't stop yourself from eating? Yes   Do you try to manage your weight by vomiting, using laxatives, diuretics, or excessive exercise? No   Do you sometimes find food on your bed which you do not remember eating? No   Do you eat late at night? Yes   Please list foods that you eat frequently Chips and candy   How active are you at work? I mostly sit at a desk   Do you exercise regularly? Yes        Physical Activity:         02/06/2022     2:14 PM   Physical Activity History   How active are you at work? I mostly sit at a desk   Do you exercise regularly? Yes   Type of Exercise Walking my dogs or cardio at the gym   How many minutes do you exercise? 45   How many times do you exercise in a week? 3   If you do not exercise, it is because of? Busy doing something else   Which physical activity do you enjoy? Cardio   Are there certain actions or activities that you cannot do because of your weight? No      Medications:     Current medications with potential to cause weight gain:no    Sleep:         02/06/2022     2:14 PM   Sleep History   How many hours do you sleep at night on average? 6   What time do you fall asleep? 10:30 PM   What time do you wake up without needing to go back to sleep?  7:00 AM   How many times do you wake up per night? 2 to 5   What time do you get the last drink of the day?  8:30 PM   Have you been diagnosed with Obstructive Sleep Apnea (OSA)? No   STOP-BANG Score Incomplete   STOP-BANG:  2/8 (Snores, BMI>35)    Stress:         02/06/2022     2:14 PM   Stress History   On a scale of 1 to 10, how stressed were you in the past year? 5   What do you do to manage your stress? Read or go for a walk        Mental Health History(emotional health):     Any thoughts about harming yourself or wanting to die: no  Engaged in any self harming behaviors e.g. cutting yourself : no  Have you been to the ER or hospitalized for mental health reasons :no  Any alcohol or substance abuse, including prescription abuse : no    Past Medical and Surgical History:     Active Ambulatory Problems     Diagnosis Date Noted   ??? Class 3 severe obesity without serious comorbidity with body mass index (BMI) of 40.0 to 44.9 in adult (CMS-HCC) 02/07/2022   ??? Vitamin D deficiency 07/22/2018   ??? Sarcoidosis 05/01/2012   ??? Essential hypertension 02/07/2022   ??? Insulin resistance 02/07/2022     Resolved Ambulatory Problems     Diagnosis Date Noted   ??? No Resolved Ambulatory Problems     Past Medical History:   Diagnosis Date   ??? Obesity [id number]       Past Surgical History:   Procedure Laterality Date   ??? BREAST SURGERY  2016       Patients previous medical records from [facility] reviewed and summarized in the Obesity Medicine Evaluation Summary     Medication list:       Current Outpatient Medications:   ???  empty container Misc, Use as directed to dispose of injectable medications, Disp: 1 each, Rfl: 3  ???  HUMIRA PEN CITRATE FREE 40 MG/0.4 ML, Inject the contents of 1 pen (40 mg total) under the skin every fourteen (14) days., Disp: 1 kit, Rfl: 11  ???  ibuprofen (MOTRIN) 600 MG tablet, Take by mouth every hour as needed., Disp: , Rfl:   ???  LARIN 24 FE 1 mg-20 mcg (24)/75 mg (4) Tab, Take 1 tablet by mouth daily., Disp: , Rfl:   ???  chlorthalidone (HYGROTON) 25 MG tablet, Take 1 tablet (25 mg total) by mouth every morning., Disp: 90 tablet, Rfl: 3  ???  metFORMIN (GLUCOPHAGE-XR) 500 MG 24 hr tablet, Take 1 tablet (500 mg total) by mouth Two (2) times a day., Disp: 120 tablet, Rfl: 3  ???  WEGOVY 0.25 MG/0.5 ML SUBCUTANEOUS PEN INJECTOR, Inject the contents of 1 pen (0.25 mg) under the skin every seven (7) days., Disp: 4 each, Rfl: 2  Above medication list reviewed and updated.    Medications         02/06/2022     2:14 PM   Weight Loss Medication History   Have you ever used any of the medications from this list? Phentermine   Enter the amount of weight loss and side-effects for any medications selected in the previous question 19 lbs and I had a hard time sleeping taking phentermine        Relevant symptom review:     Glaucoma: no  Palpitations: no  Chest Pain: no  Headaches: no  Nephrolithiasis: no  Seizures: no  H/o pancreatitis: no  Personal or family history of medullary cancer of thyroid:no    Musculoskeletal pain no  Urinary incontinence no  Heartburn no     Symptoms of  cushings disease: no  H/o head trauma: no  H/o radiation to the brain: no    Irregular menstrual cycles: No  Excessive facial hair: No    Female patient ROS:     Last menstrual period: No LMP recorded.  Current contraceptive use: OCP.    All other systems reviewed: Yes.    Relevant Family History:     History reviewed. No pertinent family history.      Social History     Socioeconomic History   ??? Marital status: Married     Spouse name: None   ??? Number of children: None   ??? Years of education: None   ??? Highest education level: None   Tobacco Use   ??? Smoking status: Never   ??? Smokeless tobacco: Never   Vaping Use   ??? Vaping Use: Never used   Substance and Sexual Activity   ??? Alcohol use: Not Currently   ??? Drug use: Never   ??? Sexual activity: Yes     Partners: Male     Birth control/protection: Diaphragm       Vital Signs:     Blood pressure 160/97, pulse 79, temperature 36.4 ??C (97.6 ??F), height 169 cm (5' 6.54), weight (!) 116.6 kg (257 lb).  Body mass index is 40.82 kg/m??. Waist Circumference: 43 inches Neck Circumference: 13    Physical Exam:     General exam: Patient appears calm and alert and oriented. Body mass index is 40.82 kg/m??.   Body fat distribution General adiposity. No supraclavicular adiposity. No dorsal adiposity. Waist Circumference: 43 inches Neck Circumference: 13  Thyroid exam: no thyromegaly, no bruit.   Lymphatics: no lymphadenopathy.  Skin exam: No lipomas, mild acanthosis nigricans.   Oral exam : Tongue moist, pink. moderate OP crowding. Good dental hygiene.  CVS: RRR nl. s1s2, Peripheral edema none  Respiratory: CTAB .  Gastrointestinal Abdomen soft. NT/ND.Moderate pannus. Intertrigo: no. No hepatomegaly.  Musculoskeletal: Patient ambulatory without help.    Labs:      Lab work reviewed in [facility] and is noted underneath    Previous studies were reviewed. Labs listed below.    Lab Results Component Value Date    A1C 5.5 02/07/2022    GLU 87 02/07/2022     Lab Results   Component Value Date    TSH 1.103 02/07/2022     Lab Results   Component Value Date    CHOL 167 02/07/2022    HDL 56 02/07/2022    TRIG 80 02/07/2022     Lab Results   Component Value Date    NA 141 02/07/2022    K 3.9 02/07/2022    CL 109 (H) 02/07/2022    CO2 25.4 02/07/2022    BUN 11 02/07/2022    CREATININE 0.75 02/07/2022    GFR >= 60 05/07/2012    GLU 87 02/07/2022    CALCIUM 9.7 02/07/2022    ALBUMIN 3.8 02/07/2022    PHOS 3.9 05/07/2012    AST 23 02/07/2022    ALT 24 02/07/2022    ALKPHOS 59 02/07/2022     No results found for: VITDTOTAL  Lab Results   Component Value Date    WBC 9.9 05/07/2012    RBC 5.06 05/07/2012    HGB 13.3 05/07/2012    HCT 40.9 05/07/2012    MCV 81 05/07/2012    MCH 26 05/07/2012    MCHC 33 05/07/2012    RDW 15.1 (H) 05/07/2012    PLT 375  05/07/2012             I attest that I, Constance Goltz, personally documented this note while acting as scribe for [facility], MD.      Constance Goltz, Scribe.  02/07/2022     The documentation recorded by the scribe accurately reflects the service I personally performed and the decisions made by me.    Marshell Garfinkel, MD

## 2022-02-08 DIAGNOSIS — Z6841 Body Mass Index (BMI) 40.0 and over, adult: Principal | ICD-10-CM

## 2022-02-08 MED FILL — HUMIRA PEN CITRATE FREE 40 MG/0.4 ML: SUBCUTANEOUS | 28 days supply | Qty: 2 | Fill #2

## 2022-02-14 NOTE — Unmapped (Unsigned)
Please disregard previous message, note found.

## 2022-02-14 NOTE — Unmapped (Signed)
Sorry Dr. Loa Socks im not seeing another note.

## 2022-02-20 NOTE — Unmapped (Signed)
[facility] did a thorough chart review. HER insurance has strict requirements. She is not meeting physical activity and adherence to healthy diet. We began this process and she needs to show that she is following a supervised weight management program. She just had one visit with [place].     Her bp is out of control so phenteremine, qsymia,diethylpropion, phendimetrazine contrave could not be used. We have documented this. Once she shows progress with her diet and exercise, we can send an appeal. So for now, she should continue metformin which we use as first line medication for weight loss.     Pl document this on her chart.   Maralyn Sago

## 2022-03-01 MED FILL — EMPTY CONTAINER: 120 days supply | Qty: 1 | Fill #1

## 2022-03-01 NOTE — Unmapped (Signed)
[facility] Refill Coordination Note    Specialty Medication(s) to be Shipped:   Inflammatory Disorders: Humira    Other medication(s) to be shipped: No additional medications requested for fill at this time     Peggy Rojas, DOB: January 29, 1981  Phone: There are no phone numbers on file.      All above HIPAA information was verified with patient.     Was a Nurse, learning disability used for this call? No    Completed refill call assessment today to schedule patient's medication shipment from the [facility] [phone]).  All relevant notes have been reviewed.     Specialty medication(s) and dose(s) confirmed: Regimen is correct and unchanged.   Changes to medications: [name] reports no changes at this time.  Changes to insurance: No  New side effects reported not previously addressed with a pharmacist or physician: None reported  Questions for the pharmacist: No    Confirmed patient received a Conservation officer, historic buildings and a Surveyor, mining with first shipment. The patient will receive a drug information handout for each medication shipped and additional FDA Medication Guides as required.       DISEASE/MEDICATION-SPECIFIC INFORMATION        For patients on injectable medications: Patient currently has 0 doses left.  Next injection is scheduled for 8/31.    SPECIALTY MEDICATION ADHERENCE     Medication Adherence    Patient reported X missed doses in the last month: 0  Specialty Medication: Humira  Patient is on additional specialty medications: No  Patient is on more than two specialty medications: No  Any gaps in refill history greater than 2 weeks in the last 3 months: no  Demonstrates understanding of importance of adherence: yes  Informant: patient                          Were doses missed due to medication being on hold? No    Humira 40mg /0.3ml: Patient has 0 days of medication on hand    REFERRAL TO PHARMACIST     Referral to the pharmacist: Not needed      [facility]     Shipping address confirmed in Epic.     Delivery Scheduled: Yes, Expected medication delivery date: 8/25.     Medication will be delivered via Same Day Courier to the prescription address in Epic WAM.    Olga Millers   [facility] Specialty Technician

## 2022-03-08 MED FILL — HUMIRA PEN CITRATE FREE 40 MG/0.4 ML: SUBCUTANEOUS | 28 days supply | Qty: 2 | Fill #3

## 2022-04-02 NOTE — Unmapped (Signed)
[facility] Refill Coordination Note    Specialty Medication(s) to be Shipped:   Inflammatory Disorders: Humira    Other medication(s) to be shipped: No additional medications requested for fill at this time     Peggy Rojas, DOB: [birth year]  Phone: There are no phone numbers on file.      All above HIPAA information was verified with patient.     Was a Nurse, learning disability used for this call? No    Completed refill call assessment today to schedule patient's medication shipment from the [facility] [phone]).  All relevant notes have been reviewed.     Specialty medication(s) and dose(s) confirmed: Regimen is correct and unchanged.   Changes to medications: Sabree reports no changes at this time.  Changes to insurance: No  New side effects reported not previously addressed with a pharmacist or physician: None reported  Questions for the pharmacist: No    Confirmed patient received a Conservation officer, historic buildings and a Surveyor, mining with first shipment. The patient will receive a drug information handout for each medication shipped and additional FDA Medication Guides as required.       DISEASE/MEDICATION-SPECIFIC INFORMATION        For patients on injectable medications: Patient currently has 0 doses left.  Next injection is scheduled for 09/28.    SPECIALTY MEDICATION ADHERENCE     Medication Adherence    Patient reported X missed doses in the last month: 0  Specialty Medication: Humira 40mg /0.53ml  Patient is on additional specialty medications: No  Patient is on more than two specialty medications: No  Any gaps in refill history greater than 2 weeks in the last 3 months: no  Demonstrates understanding of importance of adherence: yes  Informant: patient  Reliability of informant: reliable  Provider-estimated medication adherence level: good  Patient is at risk for Non-Adherence: No  Reasons for non-adherence: no problems identified                  Confirmed plan for next specialty medication refill: delivery by pharmacy  Refills needed for supportive medications: not needed          Refill Coordination    Has the Patients' Contact Information Changed: No  Is the Shipping Address Different: No         Were doses missed due to medication being on hold? No    Humira 40/0.4 mg/ml: 0 days of medicine on hand       REFERRAL TO PHARMACIST     Referral to the pharmacist: Not needed      [facility]     Shipping address confirmed in Epic.     Delivery Scheduled: Yes, Expected medication delivery date: 09/22.     Medication will be delivered via Same Day Courier to the prescription address in Epic WAM.    Antonietta Barcelona   [facility] Specialty Technician

## 2022-04-03 MED FILL — WEGOVY 0.25 MG/0.5 ML SUBCUTANEOUS PEN INJECTOR: SUBCUTANEOUS | 28 days supply | Qty: 4 | Fill #1

## 2022-04-05 MED FILL — HUMIRA PEN CITRATE FREE 40 MG/0.4 ML: SUBCUTANEOUS | 28 days supply | Qty: 2 | Fill #4

## 2022-04-09 DIAGNOSIS — E661 Drug-induced obesity: Principal | ICD-10-CM

## 2022-04-09 DIAGNOSIS — Z6841 Body Mass Index (BMI) 40.0 and over, adult: Principal | ICD-10-CM

## 2022-04-09 MED ORDER — WEGOVY 0.5 MG/0.5 ML SUBCUTANEOUS PEN INJECTOR
SUBCUTANEOUS | 1 refills | 0 days | Status: CP
Start: 2022-04-09 — End: ?
  Filled 2022-04-30: qty 4, 28d supply, fill #0

## 2022-04-22 NOTE — Unmapped (Signed)
[facility] Refill Coordination Note    Specialty Medication(s) to be Shipped:   Inflammatory Disorders: Humira    Other medication(s) to be shipped: No additional medications requested for fill at this time     Peggy Rojas, DOB: [birth year]  Phone: There are no phone numbers on file.      All above HIPAA information was verified with patient.     Was a Nurse, learning disability used for this call? No    Completed refill call assessment today to schedule patient's medication shipment from the [facility] [phone]).  All relevant notes have been reviewed.     Specialty medication(s) and dose(s) confirmed: Regimen is correct and unchanged.   Changes to medications: [name] reports no changes at this time.  Changes to insurance: No  New side effects reported not previously addressed with a pharmacist or physician: None reported  Questions for the pharmacist: No    Confirmed patient received a Conservation officer, historic buildings and a Surveyor, mining with first shipment. The patient will receive a drug information handout for each medication shipped and additional FDA Medication Guides as required.       DISEASE/MEDICATION-SPECIFIC INFORMATION        For patients on injectable medications: Patient currently has 1 doses left.  Next injection is scheduled for 10/12.    SPECIALTY MEDICATION ADHERENCE     Medication Adherence    Patient reported X missed doses in the last month: 0  Specialty Medication: Humira  Patient is on additional specialty medications: No  Patient is on more than two specialty medications: No  Any gaps in refill history greater than 2 weeks in the last 3 months: no  Demonstrates understanding of importance of adherence: yes  Informant: patient                          Were doses missed due to medication being on hold? No    Humira 40mg /0.57ml: Patient has 14 days of medication on hand    REFERRAL TO PHARMACIST     Referral to the pharmacist: Not needed      [facility] Shipping address confirmed in Epic.     Delivery Scheduled: Yes, Expected medication delivery date: 10/17.     Medication will be delivered via Same Day Courier to the prescription address in Epic WAM.    Olga Millers   [facility] Specialty Technician

## 2022-04-30 MED FILL — HUMIRA PEN CITRATE FREE 40 MG/0.4 ML: SUBCUTANEOUS | 28 days supply | Qty: 2 | Fill #5

## 2022-05-02 ENCOUNTER — Ambulatory Visit
Admit: 2022-05-02 | Discharge: 2022-05-03 | Payer: PRIVATE HEALTH INSURANCE | Attending: Family Medicine | Primary: Family Medicine

## 2022-05-02 DIAGNOSIS — E88819 Insulin resistance: Principal | ICD-10-CM

## 2022-05-02 DIAGNOSIS — Z23 Encounter for immunization: Principal | ICD-10-CM

## 2022-05-02 DIAGNOSIS — E661 Drug-induced obesity: Principal | ICD-10-CM

## 2022-05-02 DIAGNOSIS — Z6841 Body Mass Index (BMI) 40.0 and over, adult: Principal | ICD-10-CM

## 2022-05-02 MED ORDER — WEGOVY 0.5 MG/0.5 ML SUBCUTANEOUS PEN INJECTOR
SUBCUTANEOUS | 1 refills | 28 days | Status: CP
Start: 2022-05-02 — End: ?

## 2022-05-02 NOTE — Unmapped (Signed)
[facility] Follow Up    Assessment/Plan:     Chief Complaint   Patient presents with    Follow-up     Wt mngmt       Problem List Items Addressed This Visit          Unprioritized    Class 3 severe obesity without serious comorbidity with body mass index (BMI) of 40.0 to 44.9 in adult (CMS-HCC)     Peggy Rojas is a 41 y.o. female with Class 3, Stage 1 obesity due to weight gain in her 30s caused by unhealthy lifestyle, including physical inactivity and frequent consumption of ultraprocessed foods and products with high amounts of added sugars. Comorbidities include insulin resistance, vitamin D deficiency. Barriers include nothing specific at this time.    Weight Summary:  Starting weight/BMI/WC/[place]: 257 lbs, BMI 40.82, WC 43, [address] 13 (02/07/2022)  Target weight/goal: No goal weight. Patient wants general improvement in health and appearance.  Today's weight/BMI: (!) 107.5 kg (237 lb 1.3 oz), BMI (!) 37.65 (05/02/2022)   % Body weight loss: N/A  Today's Waist Circumference: 41.5 inches (05/02/2022)  Today's visit number: 2nd  Duration in program: 3 mos    PCP is in [employer] through [employer] (private practice.)     Referred by: A friend who is also our patient.    GOALS: 1) Continue healthy eating habits including no juice and little to no takeout  2) Continue physical activity routine  3)     I have reviewed the patient's medical history, lifestyle history and labs/tests.   My recommendations include the following:    Lifestyle Pattern Summary: Pt has cutout takeout and has cutout/switched SSBs to diet. Pt started going to her apartment gym as well! Congratulated Pt on these accomplishments and encouraged to continue healthy habits.   - See GOALS above.     Sleep: Currently sleeping 7 hours without sleep aids. STOP-BANG negative (2/8).  -- Recommend 7-8 hours of sleep at night.    Stress management: Pt reports having good stress management techniques such as reading.     Weight gain causing medication: None.    Medication: Taking metformin and wegovy as directed. Tolerating well. Denies s/e. Continue current regimen.   CONTINUE Metformin 500 mg twice a day with meals, CONTINUE Wegovy 0.25 mg once weekly injections  - Wegovy: maybe started 02/07/22 at 257 lbs  - Metformin: started 02/07/2022 at 257 lbs.  Tried/Contraindicated:  - Phentermine: WL of 19 lbs, couldn't tolerate d/t headaches and insomnia    Obesity Surgery: Informed Pt about surgical options. Declines for now. Wishes to try medical management but will consider in the future. Pt verbalized that if Reginal Lutes is not covered, she would like to seriously consider surgery. (Updated 02/07/2022)    Medical conditions:  Glaucoma: no  Seizures: no  Medullary thyroid cancer (personal or family hx): no  Multiple Endocrine Neoplasia: no  Palpitations/Tachycardia: no  Chest Pain: no past history of MI.   Headaches/Migraines: no  Nephrolithiasis: no  H/o pancreatitis: no  GERD: no     Prior Surgeries:  Cholecystectomy: no  Hysterectomy: no     Birth Control Methods: OCP         Relevant Medications    WEGOVY 0.5 MG/0.5 ML SUBCUTANEOUS PEN INJECTOR    Insulin resistance - Primary     Reviewed relevant medications and labs. Pt is following our Weight Management Clinic. See Obesity tab for medication changes.  Other Visit Diagnoses       Need for influenza vaccination        Relevant Orders    INFLUENZA VACCINE (QUAD) IM - 6 MO-ADULT - PF (Completed)             Return in about 2 months (around 07/02/2022) for Weight clinic F/U one slot..    I have reviewed and addressed the patient???s adherence and response to prescribed medications. I have identified patient barriers to following the proposed medication and treatment plan, and have noted opportunities to optimize healthy behaviors. I have answered the patient???s questions to satisfaction and the patient voices understanding.    Time: Greater than 50% of this encounter was spent in direct consultation with the patient in evaluation and discussing all of the above. Duration of encounter: 30 minutes.     HPI:     Peggy Rojas is a 41 y.o. female who  has a past medical history of Essential hypertension (02/07/2022) and Obesity (24401027). who presents today for [facility] follow up.    Weight Management History  Wt Readings from Last 6 Encounters:   05/02/22 (!) 107.5 kg (237 lb 1.3 oz)   02/07/22 (!) 116.6 kg (257 lb)         02/07/2022     1:00 PM 05/02/2022     2:41 PM   Waist Circumference   Waist Circumference 43 inches 41.5 inches         Update: 20 lb loss in 3 mos.   Medication: Pt taking Wegovy 0.5 mg and metformin 500 mg BID as directed.   Eating pattern: Pt has cutout a lot of takeout. No longer drinking juice. Eating out maybe once a week now. Drinking diet soda every other day. Pt has switched snacks to healthier alternatives.   B: cereal or greek yogurt  L: frozen meal or chicken  D: salad, chicken/fish/pork, rice,veggies, frozen meal, soup  Physical Activity: Pt has a gym at her apartment which she uses 3x/week for 45 mins. Walks her dogs daily.   Barrier: nothing specific    Lab Results   Component Value Date    LDL 95 02/07/2022    HDL 56 02/07/2022    A1C 5.5 02/07/2022    GLU 87 02/07/2022    TSH 1.103 02/07/2022    AST 23 02/07/2022    ALT 24 02/07/2022     Past Medical/Surgical History:     Past Medical History:   Diagnosis Date    Essential hypertension 02/07/2022    Obesity [id number]     Past Surgical History:   Procedure Laterality Date    BREAST SURGERY  2016       Social History:     Social History     Socioeconomic History    Marital status: Married     Spouse name: None    Number of children: None    Years of education: None    Highest education level: None   Tobacco Use    Smoking status: Never    Smokeless tobacco: Never   Vaping Use    Vaping Use: Never used   Substance and Sexual Activity    Alcohol use: Not Currently    Drug use: Never    Sexual activity: Yes     Partners: Male     Birth control/protection: Diaphragm       Family History:     History reviewed. No pertinent family history.    Allergies:  Patient has no known allergies.    Current Medications:     Current Outpatient Medications   Medication Sig Dispense Refill    chlorthalidone (HYGROTON) 25 MG tablet Take 1 tablet (25 mg total) by mouth every morning. 90 tablet 3    empty container Misc Use as directed to dispose of injectable medications 1 each 3    HUMIRA PEN CITRATE FREE 40 MG/0.4 ML Inject the contents of 1 pen (40 mg total) under the skin every fourteen (14) days. 1 kit 11    ibuprofen (MOTRIN) 600 MG tablet Take by mouth every hour as needed.      LARIN 24 FE 1 mg-20 mcg (24)/75 mg (4) Tab Take 1 tablet by mouth daily.      metFORMIN (GLUCOPHAGE-XR) 500 MG 24 hr tablet Take 1 tablet (500 mg total) by mouth Two (2) times a day. 120 tablet 3    WEGOVY 0.5 MG/0.5 ML SUBCUTANEOUS PEN INJECTOR Inject the contents of 1 pen (0.5 mg) under the skin every seven (7) days. 4 each 1     No current facility-administered medications for this visit.       I have reviewed and (if needed) updated the patient's problem list, medications, allergies, past medical and surgical history, social and family history.    ROS:     Unless otherwise stated in the HPI:  CONSTITUTIONAL: no fever chills.   HEENT: Eyes: No diplopia or blurred vision. ENT: No earache, sore throat or runny nose.   CARDIOVASCULAR: No pressure, squeezing, strangling, tightness, heaviness or aching about the chest, neck, axilla or epigastrium.   RESPIRATORY: No cough, shortness of breath, PND or orthopnea.   GASTROINTESTINAL: No nausea, vomiting or diarrhea.   GENITOURINARY: No dysuria, frequency or urgency.   MUSCULOSKELETAL: no new pains, no joint swelling or redness.   SKIN: No change in skin, hair or nails.   NEUROLOGIC: No paresthesias, fasciculations, seizures or weakness.   PSYCHIATRIC: No disorder of thought or mood.   ENDOCRINE: No heat or cold intolerance, polyuria or polydipsia.   HEMATOLOGICAL: No easy bruising or bleeding.    Vital Signs:     Body mass index is 37.65 kg/m??. Waist Circumference: 41.5 inches    Wt Readings from Last 3 Encounters:   05/02/22 (!) 107.5 kg (237 lb 1.3 oz)   02/07/22 (!) 116.6 kg (257 lb)     Temp Readings from Last 3 Encounters:   05/02/22 36.6 ??C (97.9 ??F)   02/07/22 36.4 ??C (97.6 ??F)     BP Readings from Last 3 Encounters:   05/02/22 155/89   02/07/22 160/97     Pulse Readings from Last 3 Encounters:   05/02/22 81   02/07/22 79         02/07/2022     1:00 PM 05/02/2022     2:41 PM   Waist Circumference   Waist Circumference 43 inches 41.5 inches          Physical Exam:     General: well appearing, in NAD, Body mass index is 37.65 kg/m??. Ambulatory without help.  Body fat distribution: General adiposity. No supraclavicular adiposity. No dorsal adiposity. Waist Circumference: 41.5 inches     Head: normocephalic atraumatic.  Eyes: PERRLA, EOMI, Sclera WNL.   Oral pharynx: moist, no exudate, no erythema, not enlarged. Good dental hygiene. Moderate OP crowding.  Neck: supple, no LAD, no thyromegaly, no bruit.  CV: RRR no rubs or murmurs. Peripheral edema: none.  Lungs: clear bilaterally to auscultation.  No wheezing.  Abdomen: soft, NT/ND. No intertrigo. Moderate pannus. No hepatomegaly.   Extremities: no clubbing, cyanosis, No edema.  Skin: no concerning lesions, rashes observed. No lipomas, moderate acanthosis nigricans.  Neuro: Alert and oriented X 3.    Labs:     No visits with results within 1 Month(s) from this visit.   Latest known visit with results is:   Office Visit on 02/07/2022   Component Date Value Ref Range Status    TSH 02/07/2022 1.103  0.550 - 4.780 uIU/mL Final    Triglycerides 02/07/2022 80  0 - 150 mg/dL Final    Cholesterol 96/29/5284 167  <=200 mg/dL Final    HDL 13/24/4010 56  40 - 60 mg/dL Final    LDL Calculated 02/07/2022 95  40 - 99 mg/dL Final    NHLBI Recommended Ranges, LDL Cholesterol, for Adults (20+yrs) (ATPIII), mg/dL  Optimal              <272  Near Optimal        100-129  Borderline High     130-159  High                160-189  Very High            >=190  NHLBI Recommended Ranges, LDL Cholesterol, for Children (2-19 yrs), mg/dL  Desirable            <536  Borderline High     110-129  High                 >=130      VLDL Cholesterol Cal 02/07/2022 16  9 - 37 mg/dL Final    Chol/HDL Ratio 02/07/2022 3.0  1.0 - 4.5 Final    Non-HDL Cholesterol 02/07/2022 111  70 - 130 mg/dL Final    Non-HDL Cholesterol Recommended Ranges (mg/dL)  Optimal       <644  Near Optimal 130 - 159  Borderline High 160 - 189  High             190 - 219  Very High       >220      FASTING 02/07/2022 No   Final    Hemoglobin A1C 02/07/2022 5.5  4.8 - 5.6 % Final    Estimated Average Glucose 02/07/2022 111  mg/dL Final    Sodium 03/47/4259 141  135 - 145 mmol/L Final    Potassium 02/07/2022 3.9  3.4 - 4.8 mmol/L Final    Chloride 02/07/2022 109 (H)  98 - 107 mmol/L Final    CO2 02/07/2022 25.4  20.0 - 31.0 mmol/L Final    Anion Gap 02/07/2022 7  5 - 14 mmol/L Final    BUN 02/07/2022 11  9 - 23 mg/dL Final    Creatinine 56/38/7564 0.75  0.60 - 0.80 mg/dL Final    BUN/Creatinine Ratio 02/07/2022 15   Final    eGFR CKD-EPI (2021) Female 02/07/2022 >90  >=60 mL/min/1.93m2 Final    eGFR calculated with CKD-EPI 2021 equation in accordance with [employer] and [employer] of Nephrology Task Force recommendations.    Glucose 02/07/2022 87  70 - 179 mg/dL Final    Calcium 33/29/5188 9.7  8.7 - 10.4 mg/dL Final    Albumin 41/66/0630 3.8  3.4 - 5.0 g/dL Final    Total Protein 02/07/2022 7.6  5.7 - 8.2 g/dL Final    Total Bilirubin 02/07/2022 0.2 (L)  0.3 - 1.2 mg/dL Final  AST 02/07/2022 23  <=34 U/L Final    ALT 02/07/2022 24  10 - 49 U/L Final    Alkaline Phosphatase 02/07/2022 59  46 - 116 U/L Final       Follow-up:     Return in about 2 months (around 07/02/2022) for Weight clinic F/U one slot..    I attest that I, Constance Goltz, personally documented this note while acting as scribe for Marshell Garfinkel, MD.      Constance Goltz, Scribe.  05/02/2022     The documentation recorded by the scribe accurately reflects the service I personally performed and the decisions made by me.    Marshell Garfinkel, MD

## 2022-05-02 NOTE — Unmapped (Addendum)
Peggy Rojas is a 41 y.o. female with Class 3, Stage 1 obesity due to weight gain in her 30s caused by unhealthy lifestyle, including physical inactivity and frequent consumption of ultraprocessed foods and products with high amounts of added sugars. Comorbidities include insulin resistance, vitamin D deficiency. Barriers include nothing specific at this time.    Weight Summary:  Starting weight/BMI/WC/[place]: 257 lbs, BMI 40.82, WC 43, [place] 13 (02/07/2022)  Target weight/goal: No goal weight. Patient wants general improvement in health and appearance.  Today's weight/BMI: (!) 107.5 kg (237 lb 1.3 oz), BMI (!) 37.65 (05/02/2022)   % Body weight loss: N/A  Today's Waist Circumference: 41.5 inches (05/02/2022)  Today's visit number: 2nd  Duration in program: 3 mos    PCP is in [employer] through [employer] (private practice.)     Referred by: A friend who is also our patient.    GOALS: 1) Continue healthy eating habits including no juice and little to no takeout  2) Continue physical activity routine  3)     I have reviewed the patient's medical history, lifestyle history and labs/tests.   My recommendations include the following:    Lifestyle Pattern Summary: Pt has cutout takeout and has cutout/switched SSBs to diet. Pt started going to her apartment gym as well! Congratulated Pt on these accomplishments and encouraged to continue healthy habits.   - See GOALS above.     Sleep: Currently sleeping 7 hours without sleep aids. STOP-BANG negative (2/8).  -- Recommend 7-8 hours of sleep at night.    Stress management: Pt reports having good stress management techniques such as reading.     Weight gain causing medication: None.    Medication: Taking metformin and wegovy as directed. Tolerating well. Denies s/e. Continue current regimen.   CONTINUE Metformin 500 mg twice a day with meals, CONTINUE Wegovy 0.25 mg once weekly injections  - Wegovy: maybe started 02/07/22 at 257 lbs  - Metformin: started 02/07/2022 at 257 lbs.  Tried/Contraindicated:  - Phentermine: WL of 19 lbs, couldn't tolerate d/t headaches and insomnia    Obesity Surgery: Informed Pt about surgical options. Declines for now. Wishes to try medical management but will consider in the future. Pt verbalized that if Reginal Lutes is not covered, she would like to seriously consider surgery. (Updated 02/07/2022)    Medical conditions:  Glaucoma: no  Seizures: no  Medullary thyroid cancer (personal or family hx): no  Multiple Endocrine Neoplasia: no  Palpitations/Tachycardia: no  Chest Pain: no past history of MI.   Headaches/Migraines: no  Nephrolithiasis: no  H/o pancreatitis: no  GERD: no     Prior Surgeries:  Cholecystectomy: no  Hysterectomy: no     Birth Control Methods: OCP

## 2022-05-03 MED ORDER — HUMIRA PEN CITRATE FREE 40 MG/0.4 ML
PACK | SUBCUTANEOUS | 11 refills | 14.00000 days | Status: CN
Start: 2022-05-03 — End: ?

## 2022-05-09 NOTE — Unmapped (Signed)
Reviewed relevant medications and labs. Pt is following our Weight Management Clinic. See Obesity tab for medication changes.

## 2022-05-13 MED ORDER — HADLIMA(CF) 40 MG/0.4 ML SUBCUTANEOUS SYRINGE
SUBCUTANEOUS | 5 refills | 0 days
Start: 2022-05-13 — End: ?

## 2022-05-14 MED ORDER — HUMIRA PEN CITRATE FREE 40 MG/0.4 ML
PACK | SUBCUTANEOUS | 11 refills | 14 days | Status: CN
Start: 2022-05-14 — End: ?

## 2022-05-14 NOTE — Unmapped (Signed)
[facility] Specialty Medication Onboarding    Specialty Medication: HADLIMA(CF) 40 mg/0.4 mL Syrg (adalimumab-bwwd)  Prior Authorization: Not Required   Financial Assistance: No - copay  <$25  Final Copay/Day Supply: $0 / 28    Insurance Restrictions: Yes - max 1 month supply     Notes to Pharmacist: Humira last filled 04/30/2022.    The triage team has completed the benefits investigation and has determined that the patient is able to fill this medication at [facility]. Please contact the patient to complete the onboarding or follow up with the prescribing physician as needed.

## 2022-05-15 MED ORDER — HADLIMA(CF) PUSHTOUCH 40 MG/0.4 ML SUBCUTANEOUS AUTO-INJECTOR
SUBCUTANEOUS | 5 refills | 0 days
Start: 2022-05-15 — End: ?

## 2022-05-15 MED ORDER — HADLIMA(CF) 40 MG/0.4 ML SUBCUTANEOUS SYRINGE
SUBCUTANEOUS | 5 refills | 0 days
Start: 2022-05-15 — End: ?

## 2022-05-15 NOTE — Unmapped (Unsigned)
[facility]   Patient Onboarding/Medication Counseling    Patient would prefer pens instead of syringes.     Ms.Mcgriff is a 41 y.o. female with sarcoidosis who I am counseling today on initiation of therapy.  I am speaking to the patient.    Was a Nurse, learning disability used for this call? No    Verified patient's date of birth / HIPAA.    Specialty medication(s) to be sent: Inflammatory Disorders: Hadlima      Non-specialty medications/supplies to be sent: n/a      Medications not needed at this time: n/a         Hadlima (adalimumab-bwwd)    The patient declined counseling on missed dose instructions, goals of therapy, side effects and monitoring parameters, warnings and precautions, drug/food interactions, and storage, handling precautions, and disposal because they have taken the medication previously. The information in the declined sections below are for informational purposes only and was not discussed with patient.     Medication & Administration     Dosage:  Inject the contents of 1 pen (40 mg total) under the skin every fourteen (14) days.    Lab tests required prior to treatment initiation:  Tuberculosis: Tuberculosis screening not documented in the patient's chart but medication prescriber has indicated they are aware and wishing to initiate treatment at this time.  Hepatitis B: Hepatitis B serology studies are not documented in the patient's chart but medication prescriber has indicated they are aware and wishing to initiate treatment at this time.    Administration:     Prefilled Auto-injector pen  1. Gather all supplies needed for injection on a clean, flat working surface: medication pen removed from packaging, alcohol swab, sharps container, etc.  2. Look at the medication label - look for correct medication, correct dose, and check the expiration date  3. Look at the medication - the liquid visible in the window on the side of the pen device should appear clear and colorless  4. Lay the auto-injector pen on a flat surface and allow it to warm up to room temperature for at least 15-30 minutes  5. Select injection site - you can use the front of your thigh or your belly (but not the area 2 inches around your belly button); if someone else is giving you the injection you can also use your upper arm in the skin covering your triceps muscle  6. Prepare injection site - wash your hands and clean the skin at the injection site with an alcohol swab and let it air dry, do not touch the injection site again before the injection  7. Pull off the clear needle cap with a metal center straight off and do not remove until immediately prior to injection and do not touch the white needle cover  8. Gently squeeze the area of cleaned skin and hold it firmly to create a firm surface at the selected injection site  9. Put the green base against your skin at the injection site at a 90 degree angle, hold the pen such that you can see the clear medication window  10. Press down and hold the pen firmly against your skin, when you push down the injection will start, there will be a click when the injection starts  11. Continue to hold the pen firmly against your skin for about 10-15 seconds - the window will start to turn solid yellow  12. To verify the injection is complete after 10-15 seconds, look  and ensure the window is solid yellow and you may hear a second click. This means the injection is complete.  Then pull the pen away from your skin. The needle should be covered by the green base.   13. Dispose of the used auto-injector pen immediately in your sharps disposal container the needle will be covered automatically  14. If you see any blood at the injection site, press a cotton ball or gauze on the site and maintain pressure until the bleeding stops, do not rub the injection site      Adherence/Missed dose instructions:  If your injection is given more than 3 days after your scheduled injection date - consult your pharmacist for additional instructions on how to adjust your dosing schedule.    Goals of Therapy     - Achieve symptom remission  - Slow disease progression  - Protection of remaining articular structures  - Maintenance of function  - Maintenance of effective psychosocial functioning      Side Effects & Monitoring Parameters     Injection site reaction (redness, irritation, inflammation localized to the site of administration)  Signs of a common cold - minor sore throat, runny or stuffy nose, etc.  Upset stomach  Headache  Rash    The following side effects should be reported to the provider:  Signs of a hypersensitivity reaction - rash; hives; itching; red, swollen, blistered, or peeling skin; wheezing; tightness in the chest or throat; difficulty breathing, swallowing, or talking; swelling of the mouth, face, lips, tongue, or throat; etc.  Reduced immune function - report signs of infection such as fever; chills; body aches; very bad sore throat; ear or sinus pain; cough; more sputum or change in color of sputum; pain with passing urine; wound that will not heal, etc.  Also at a slightly higher risk of some malignancies (mainly skin and blood cancers) due to this reduced immune function.  In the case of signs of infection - the patient should hold the next dose of Hadlima?? and call your primary care provider to ensure adequate medical care.  Treatment may be resumed when infection is treated and patient is asymptomatic.  Changes in skin - a new growth or lump that forms; changes in shape, size, or color of a previous mole or marking  Signs of unexplained bruising or bleeding - throwing up blood or emesis that looks like coffee grounds; black, tarry, or bloody stool; etc.  Signs of new or worsening heart failure - shortness of breath; sudden weight gain; heartbeat that is not normal; swelling in the arms or legs that is new or worse      Contraindications, Warnings, & Precautions     Have your bloodwork checked as you have been told by your prescriber  Talk with your doctor if you are pregnant, planning to become pregnant, or breastfeeding  Discuss the possible need for holding your dose(s) of Hadlima?? when a planned procedure is scheduled with the prescriber as it may delay healing/recovery timeline       Drug/Food Interactions     Medication list reviewed in Epic. The patient was instructed to inform the care team before taking any new medications or supplements. No drug interactions identified.   Talk with you prescriber or pharmacist before receiving any live vaccinations while taking this medication and after you stop taking it    Storage, Handling Precautions, & Disposal     Store this medication in the refrigerator.  Do not freeze  If  needed, you may store at room temperature for up to 14 days  Store in original packaging, protected from light  Do not shake  Dispose of used syringes/pens in a sharps disposal container      Current Medications (including OTC/herbals), Comorbidities and Allergies     Current Outpatient Medications   Medication Sig Dispense Refill    adalimumab-bwwd (HADLIMA,CF,) 40 mg/0.4 mL Syrg Inject the contents of 1 syringe (40 mg total) under the skin every fourteen (14) days. 2.4 mL 5    chlorthalidone (HYGROTON) 25 MG tablet Take 1 tablet (25 mg total) by mouth every morning. 90 tablet 3    empty container Misc Use as directed to dispose of injectable medications 1 each 3    HUMIRA PEN CITRATE FREE 40 MG/0.4 ML Inject the contents of 1 pen (40 mg total) under the skin every fourteen (14) days. 1 kit 11    ibuprofen (MOTRIN) 600 MG tablet Take by mouth every hour as needed.      LARIN 24 FE 1 mg-20 mcg (24)/75 mg (4) Tab Take 1 tablet by mouth daily.      metFORMIN (GLUCOPHAGE-XR) 500 MG 24 hr tablet Take 1 tablet (500 mg total) by mouth Two (2) times a day. 120 tablet 3    WEGOVY 0.5 MG/0.5 ML SUBCUTANEOUS PEN INJECTOR Inject the contents of 1 pen (0.5 mg) under the skin every seven (7) days. 4 each 1     No current facility-administered medications for this visit.       No Known Allergies    Patient Active Problem List   Diagnosis    Class 3 severe obesity without serious comorbidity with body mass index (BMI) of 40.0 to 44.9 in adult (CMS-HCC)    Vitamin D deficiency    Sarcoidosis    Essential hypertension    Insulin resistance       Reviewed and up to date in Epic.    Appropriateness of Therapy     Acute infections noted within Epic:  No active infections  Patient reported infection: None    Is medication and dose appropriate based on diagnosis and infection status? Yes    Prescription has been clinically reviewed: Yes      Baseline Quality of Life Assessment      How many days over the past month did your uveitis  keep you from your normal activities? For example, brushing your teeth or getting up in the morning. Patient declined to answer    Financial Information     Medication Assistance provided: Prior Authorization    Anticipated copay of $0 reviewed with patient. Verified delivery address.    Delivery Information     Scheduled delivery date: 11/13    Expected start date: 11/23    Medication will be delivered via Same Day Courier to the prescription address in [facility].  This shipment will not require a signature.      Explained the services we provide at [facility] and that each month we would call to set up refills.  Stressed importance of returning phone calls so that we could ensure they receive their medications in time each month.  Informed patient that we should be setting up refills 7-10 days prior to when they will run out of medication.  A pharmacist will reach out to perform a clinical assessment periodically.  Informed patient that a welcome packet, containing information about our pharmacy and other support services, a Notice of Privacy Practices,  and a drug information handout will be sent.      The patient or caregiver noted above participated in the development of this care plan and knows that they can request review of or adjustments to the care plan at any time.      Patient or caregiver verbalized understanding of the above information as well as how to contact the pharmacy at [phone] option 4 with any questions/concerns.  The pharmacy is open Monday through Friday 8:30am-4:30pm.  A pharmacist is available 24/7 via pager to answer any clinical questions they may have.    Patient Specific Needs     Does the patient have any physical, cognitive, or cultural barriers? No    Does the patient have adequate living arrangements? (i.e. the ability to store and take their medication appropriately) Yes    Did you identify any home environmental safety or security hazards? No    Patient prefers to have medications discussed with  Patient     Is the patient or caregiver able to read and understand education materials at a high school level or above? Yes    Patient's primary language is  English     Is the patient high risk? No    SOCIAL DETERMINANTS OF HEALTH     At the [facility], we have learned that life circumstances - like trouble affording food, housing, utilities, or transportation can affect the health of many of our patients.   That is why we wanted to ask: are you currently experiencing any life circumstances that are negatively impacting your health and/or quality of life? No    Social Determinants of Psychologist, prison and probation services Strain: Not on file   Internet Connectivity: Not on file   Food Insecurity: Not on file   Tobacco Use: Low Risk  (05/02/2022)    Patient History     Smoking Tobacco Use: Never     Smokeless Tobacco Use: Never     Passive Exposure: Not on file   Housing/Utilities: Not on file   Alcohol Use: Not on file   Transportation Needs: Not on file   Substance Use: Not on file   Health Literacy: Not on file   Physical Activity: Not on file   Interpersonal Safety: Not on file   Stress: Not on file   Intimate Partner Violence: Not on file   Depression: Not on file   Social Connections: Not on file       Would you be willing to receive help with any of the needs that you have identified today? Not applicable       Julianne Rice, PharmD  [facility] Specialty Pharmacist questions/concerns.  The pharmacy is open Monday through Friday 8:30am-4:30pm.  A pharmacist is available 24/7 via pager to answer any clinical questions they may have.    Patient Specific Needs     Does the patient have any physical, cognitive, or cultural barriers? {Blank single:19197::No,Yes - ***}    Does the patient have adequate living arrangements? (i.e. the ability to store and take their medication appropriately) {Blank single:19197::Yes,No - ***}    Did you identify any home environmental safety or security hazards? {Blank single:19197::No,Yes - ***}    Patient prefers to have medications discussed with  {Blank single:19197::Patient,Family Member,Caregiver,Other}     Is the patient or caregiver able to read and understand education materials at a high school level or above? {Blank single:19197::No,Yes}    Patient's primary language is  {  Blank single:19197::English,Spanish,***}     Is the patient high risk? {sschighriskpts:78327}    SOCIAL DETERMINANTS OF HEALTH     At the [facility], we have learned that life circumstances - like trouble affording food, housing, utilities, or transportation can affect the health of many of our patients.   That is why we wanted to ask: are you currently experiencing any life circumstances that are negatively impacting your health and/or quality of life? {YES/NO/PATIENTDECLINED:93004}    Social Determinants of Health     Financial Resource Strain: Not on file   Internet Connectivity: Not on file   Food Insecurity: Not on file   Tobacco Use: Low Risk  (05/02/2022)    Patient History     Smoking Tobacco Use: Never     Smokeless Tobacco Use: Never     Passive Exposure: Not on file   Housing/Utilities: Not on file   Alcohol Use: Not on file   Transportation Needs: Not on file   Substance Use: Not on file   Health Literacy: Not on file   Physical Activity: Not on file   Interpersonal Safety: Not on file   Stress: Not on file   Intimate Partner Violence: Not on file   Depression: Not on file   Social Connections: Not on file       Would you be willing to receive help with any of the needs that you have identified today? {Yes/No/Not applicable:93005}       Julianne Rice, PharmD  [facility] Specialty Pharmacist

## 2022-05-16 DIAGNOSIS — Z6841 Body Mass Index (BMI) 40.0 and over, adult: Principal | ICD-10-CM

## 2022-05-16 DIAGNOSIS — E661 Drug-induced obesity: Principal | ICD-10-CM

## 2022-05-16 MED ORDER — WEGOVY 0.5 MG/0.5 ML SUBCUTANEOUS PEN INJECTOR
SUBCUTANEOUS | 1 refills | 28.00000 days
Start: 2022-05-16 — End: ?

## 2022-05-19 DIAGNOSIS — E661 Drug-induced obesity: Principal | ICD-10-CM

## 2022-05-19 DIAGNOSIS — Z6841 Body Mass Index (BMI) 40.0 and over, adult: Principal | ICD-10-CM

## 2022-05-19 MED ORDER — WEGOVY 0.5 MG/0.5 ML SUBCUTANEOUS PEN INJECTOR
SUBCUTANEOUS | 1 refills | 28 days
Start: 2022-05-19 — End: ?

## 2022-05-20 MED ORDER — WEGOVY 0.5 MG/0.5 ML SUBCUTANEOUS PEN INJECTOR
SUBCUTANEOUS | 1 refills | 28 days
Start: 2022-05-20 — End: ?

## 2022-05-21 NOTE — Unmapped (Signed)
Clinical Assessment Needed For: Formulation Change  Medication: HADLIMA(CF) PUSHTOUCH 40 mg/0.4 mL Atin (adalimumab-bwwd)  Last Fill Date/Day Supply: n/a / n/a  Copay $0  Was previous dose already scheduled to fill: No    Notes to Pharmacist: Humira last filled 04/30/2022. Provider sent new rx for pens.

## 2022-05-24 DIAGNOSIS — Z6841 Body Mass Index (BMI) 40.0 and over, adult: Principal | ICD-10-CM

## 2022-05-24 DIAGNOSIS — E661 Drug-induced obesity: Principal | ICD-10-CM

## 2022-05-24 MED ORDER — WEGOVY 0.5 MG/0.5 ML SUBCUTANEOUS PEN INJECTOR
SUBCUTANEOUS | 1 refills | 28 days
Start: 2022-05-24 — End: ?

## 2022-05-24 NOTE — Unmapped (Signed)
Duplicate requests, refill requested too soon

## 2022-05-25 DIAGNOSIS — Z6841 Body Mass Index (BMI) 40.0 and over, adult: Principal | ICD-10-CM

## 2022-05-25 DIAGNOSIS — E661 Drug-induced obesity: Principal | ICD-10-CM

## 2022-05-25 MED ORDER — WEGOVY 0.5 MG/0.5 ML SUBCUTANEOUS PEN INJECTOR
SUBCUTANEOUS | 1 refills | 28 days
Start: 2022-05-25 — End: ?

## 2022-05-27 DIAGNOSIS — E661 Drug-induced obesity: Principal | ICD-10-CM

## 2022-05-27 DIAGNOSIS — Z6841 Body Mass Index (BMI) 40.0 and over, adult: Principal | ICD-10-CM

## 2022-05-27 MED ORDER — WEGOVY 0.5 MG/0.5 ML SUBCUTANEOUS PEN INJECTOR
SUBCUTANEOUS | 1 refills | 28.00000 days
Start: 2022-05-27 — End: ?

## 2022-05-27 MED FILL — HADLIMA(CF) PUSHTOUCH 40 MG/0.4 ML SUBCUTANEOUS AUTO-INJECTOR: SUBCUTANEOUS | 28 days supply | Qty: 0.8 | Fill #0

## 2022-05-27 NOTE — Unmapped (Signed)
Patient is requesting the following refill  Requested Prescriptions     Pending Prescriptions Disp Refills    WEGOVY 0.5 MG/0.5 ML SUBCUTANEOUS PEN INJECTOR 4 each 1     Sig: Inject the contents of 1 pen (0.5 mg) under the skin every seven (7) days.       Recent Visits  Date Type Provider Dept   05/02/22 Office Visit Ro, Honor Junes, MD [facility] [address] [facility]   02/07/22 Office Visit Ro, Honor Junes, MD [facility] [address] [place]   Showing recent visits within past 365 days with a meds authorizing provider and meeting all other requirements  Future Appointments  Date Type Provider Dept   07/02/22 Appointment Ro, Honor Junes, MD [facility] [address]   09/03/22 Appointment Ro, Honor Junes, MD [facility] [address] [place]   10/30/22 Appointment Ro, Honor Junes, MD [facility] [address]   01/01/23 Appointment Ro, Honor Junes, MD [facility] [address] [place]   Showing future appointments within next 365 days with a meds authorizing provider and meeting all other requirements       Labs: A1c:   Hemoglobin A1C (%)   Date Value   02/07/2022 5.5

## 2022-05-28 DIAGNOSIS — E661 Drug-induced obesity: Principal | ICD-10-CM

## 2022-05-28 DIAGNOSIS — Z6841 Body Mass Index (BMI) 40.0 and over, adult: Principal | ICD-10-CM

## 2022-05-28 MED ORDER — WEGOVY 0.5 MG/0.5 ML SUBCUTANEOUS PEN INJECTOR
SUBCUTANEOUS | 1 refills | 28 days
Start: 2022-05-28 — End: ?

## 2022-05-28 NOTE — Unmapped (Signed)
Patient called in to see if she could place an order for her WEGOVY 0.5 MG/0.5 ML SUBCUTANEOUS PEN INJECTOR [[id number]][[phone]]  per her she has put in several request on mychart with no response . She also said the [facility] doesn't carry the injection so she reached out to shared services and per her shared services told her that she is going to need something from [facility] so they can put patient on wait list for injection since its on back order please advise

## 2022-05-28 NOTE — Unmapped (Signed)
Patient called in to see if she could place an order for her WEGOVY 0.5 MG/0.5 ML SUBCUTANEOUS PEN INJECTOR [[id number]]  per her she has put in several request on mychart with no response . She also said the [facility] pharmacy doesn't carry the injection so she reached out to shared services and per her shared services told her that she is going to need something from [facility] so they can put patient on wait list for injection since its on back order please advise

## 2022-05-29 MED ORDER — WEGOVY 0.5 MG/0.5 ML SUBCUTANEOUS PEN INJECTOR
SUBCUTANEOUS | 1 refills | 28.00000 days | Status: CP
Start: 2022-05-29 — End: ?

## 2022-05-29 NOTE — Unmapped (Signed)
Sent!

## 2022-06-03 DIAGNOSIS — Z6841 Body Mass Index (BMI) 40.0 and over, adult: Principal | ICD-10-CM

## 2022-06-03 DIAGNOSIS — E661 Drug-induced obesity: Principal | ICD-10-CM

## 2022-06-03 MED ORDER — WEGOVY 0.5 MG/0.5 ML SUBCUTANEOUS PEN INJECTOR
SUBCUTANEOUS | 1 refills | 28 days | Status: CP
Start: 2022-06-03 — End: ?

## 2022-06-03 NOTE — Unmapped (Signed)
Patient called in to inform clinic that shared services will no longer carry the [facility] patient needs to know where else she can pick up this medication please advise

## 2022-06-03 NOTE — Unmapped (Signed)
Pt states [facility] in [facility] [address] has the prescription for [employer] and would like for the prescription to be sent there. Please advise

## 2022-06-03 NOTE — Unmapped (Signed)
[place] Assessment of Medications Program (CAMP)  RECRUITMENT SUMMARY NOTE   Network Health Plan (NHP) Hypertension      CAMP is a team of pharmacists and pharmacy technicians within [facility] that supports patients and providers.     Patient was identified for CAMP services based on criteria including, 42 years of age or older, with a recent managing provider visit, and   Average Blood Pressure >= 130/80mmHg     A letter has been sent explaining program services.        Amita Atayde, CPhT   Certified Pharmacy Technician   [place] Assessment of Medications Program (CAMP)   P [phone], F [phone]

## 2022-06-03 NOTE — Unmapped (Signed)
Sent to pharmacy per pt request, notified via mychart.

## 2022-06-05 NOTE — Unmapped (Signed)
Patient reports that progress notes on weight lost journey need to be sent to [facility] , in addition to Prior Auth.

## 2022-06-05 NOTE — Unmapped (Signed)
Notes have been sent through cover my meds.

## 2022-06-18 NOTE — Unmapped (Signed)
[place] Assessment of Medications Program (CAMP)  RECRUITMENT SUMMARY NOTE   Network Health Plan (NHP) Hypertension      Patient was outreached for [employer]. Patient scheduled.     Hypertension Visit Scheduling Checklist:     COS has verified the following with the patient/patient representative:     []  Last BP reading (155/89)   Pharmacy ([facility])   []  Ask if patient sees a cardiologist, nephrologist, CPP or outside provider.   []  Please list non-[facility] providers(List Providers Here).   If patient has a Non-[facility] provider, send a records request  If patient has a CPP, send inbox message to CAMP pharmacist   []  Verify current anti-hypertensive medications per patient: ()chlorthlaidone)  []  Remind patient to have blood pressure and heart rate readings available for pharmacist visit.     Camp Scheduled      Med Pre Visit Patient Level Data    PCP confirmed on care team?: Yes  How many pharmacies do you obtain medications from?: 1  What type of pharmacies do you use?: Retail Pharmacy, [facility]  Who helps you with your medications?: Manage Myself  Would it be helpful for [place] to talk with the person who helps you with your medications?: No  Do you have issues affording the cost of any of your medications?: No  DO you have fills for 90 day supplies?: No  Are there any other things you hope to discuss with your pharmacist during your visit?: No                       Ashley Bultema Jimmey Ralph, CPhT   Certified Pharmacy Technician   [place] Assessment of Medications Program (CAMP)   P [phone], F [phone]

## 2022-06-25 NOTE — Unmapped (Signed)
[facility] Clinical Assessment & Refill Coordination Note    Peggy Rojas, DOB: [birth year]  Phone: There are no phone numbers on file.    All above HIPAA information was verified with patient.     Was a Nurse, learning disability used for this call? No    Specialty Medication(s):   Inflammatory Disorders: Hadlima     Current Outpatient Medications   Medication Sig Dispense Refill    adalimumab-bwwd (HADLIMA,CF, PUSHTOUCH) 40 mg/0.4 mL AtIn Inject the contents of 1 pen (40 mg total) under the skin every fourteen (14) days. 2.4 mL 5    chlorthalidone (HYGROTON) 25 MG tablet Take 1 tablet (25 mg total) by mouth every morning. 90 tablet 3    empty container Misc Use as directed to dispose of injectable medications 1 each 3    ibuprofen (MOTRIN) 600 MG tablet Take by mouth every hour as needed.      LARIN 24 FE 1 mg-20 mcg (24)/75 mg (4) Tab Take 1 tablet by mouth daily.      metFORMIN (GLUCOPHAGE-XR) 500 MG 24 hr tablet Take 1 tablet (500 mg total) by mouth Two (2) times a day. 120 tablet 3    WEGOVY 0.5 MG/0.5 ML SUBCUTANEOUS PEN INJECTOR Inject the contents of 1 pen (0.5 mg) under the skin every seven (7) days. 4 each 1     No current facility-administered medications for this visit.        Changes to medications: Peggy Rojas reports no changes at this time.    No Known Allergies    Changes to allergies: No    SPECIALTY MEDICATION ADHERENCE     Hadlima 40  mg/0.36mL : 9 days of medicine on hand       Medication Adherence    Patient reported X missed doses in the last month: 0  Specialty Medication: Hadlima 40mg /0.47mL  Informant: patient                            Specialty medication(s) dose(s) confirmed: Regimen is correct and unchanged.     Are there any concerns with adherence? No    Adherence counseling provided? Not needed    CLINICAL MANAGEMENT AND INTERVENTION      Clinical Benefit Assessment:    Do you feel the medicine is effective or helping your condition? Yes    Clinical Benefit counseling provided? Not needed    Adverse Effects Assessment:    Are you experiencing any side effects? No    Are you experiencing difficulty administering your medicine? No    Quality of Life Assessment:    Quality of Life    Rheumatology  1. What impact has your specialty medication had on the reduction of your daily pain level?: Some  2. What impact has your specialty medication had on your ability to complete daily tasks (prepare meals, get dressed, etc...)?: Some  Oncology  Dermatology  Cystic Fibrosis          How many days over the past month did your sarcoidosis  keep you from your normal activities? For example, brushing your teeth or getting up in the morning. Patient declined to answer    Have you discussed this with your provider? Not needed    Acute Infection Status:    Acute infections noted within Epic:  No active infections  Patient reported infection: None    Therapy Appropriateness:    Is therapy appropriate and patient progressing  towards therapeutic goals? Yes, therapy is appropriate and should be continued    DISEASE/MEDICATION-SPECIFIC INFORMATION      For patients on injectable medications: Patient currently has 0 doses left.  Next injection is scheduled for 06/27/22.    Chronic Inflammatory Diseases: Have you experienced any flares in the last month? No  Has this been reported to your provider? No    PATIENT SPECIFIC NEEDS     Does the patient have any physical, cognitive, or cultural barriers? No    Is the patient high risk? No    Did the patient require a clinical intervention? No    Does the patient require physician intervention or other additional services (i.e., nutrition, smoking cessation, social work)? No    SOCIAL DETERMINANTS OF HEALTH     At the [facility], we have learned that life circumstances - like trouble affording food, housing, utilities, or transportation can affect the health of many of our patients.   That is why we wanted to ask: are you currently experiencing any life circumstances that are negatively impacting your health and/or quality of life? Patient declined to answer    Social Determinants of Health     Financial Resource Strain: Not on file   Internet Connectivity: Not on file   Food Insecurity: Not on file   Tobacco Use: Low Risk  (05/02/2022)    Patient History     Smoking Tobacco Use: Never     Smokeless Tobacco Use: Never     Passive Exposure: Not on file   Housing/Utilities: Not on file   Alcohol Use: Not on file   Transportation Needs: Not on file   Substance Use: Not on file   Health Literacy: Not on file   Physical Activity: Not on file   Interpersonal Safety: Not on file   Stress: Not on file   Intimate Partner Violence: Not on file   Depression: Not on file   Social Connections: Not on file       Would you be willing to receive help with any of the needs that you have identified today? Not applicable       SHIPPING     Specialty Medication(s) to be Shipped:   Inflammatory Disorders: Hadlima    Other medication(s) to be shipped: No additional medications requested for fill at this time     Changes to insurance: No    Delivery Scheduled: Yes, Expected medication delivery date: 06/27/22.     Medication will be delivered via Same Day Courier to the confirmed prescription address in [facility].    The patient will receive a drug information handout for each medication shipped and additional FDA Medication Guides as required.  Verified that patient has previously received a Conservation officer, historic buildings and a Surveyor, mining.    The patient or caregiver noted above participated in the development of this care plan and knows that they can request review of or adjustments to the care plan at any time.      All of the patient's questions and concerns have been addressed.    Camillo Flaming, PharmD   [facility] Specialty Pharmacist

## 2022-06-27 MED FILL — HADLIMA(CF) PUSHTOUCH 40 MG/0.4 ML SUBCUTANEOUS AUTO-INJECTOR: SUBCUTANEOUS | 28 days supply | Qty: 0.8 | Fill #1

## 2022-07-02 ENCOUNTER — Ambulatory Visit
Admit: 2022-07-02 | Discharge: 2022-07-03 | Payer: PRIVATE HEALTH INSURANCE | Attending: Family Medicine | Primary: Family Medicine

## 2022-07-02 DIAGNOSIS — E661 Drug-induced obesity: Principal | ICD-10-CM

## 2022-07-02 DIAGNOSIS — Z6841 Body Mass Index (BMI) 40.0 and over, adult: Principal | ICD-10-CM

## 2022-07-02 DIAGNOSIS — Z6835 Body mass index (BMI) 35.0-35.9, adult: Principal | ICD-10-CM

## 2022-07-02 DIAGNOSIS — E88819 Insulin resistance: Principal | ICD-10-CM

## 2022-07-02 DIAGNOSIS — I10 Essential (primary) hypertension: Principal | ICD-10-CM

## 2022-07-02 DIAGNOSIS — E669 Obesity, unspecified: Principal | ICD-10-CM

## 2022-07-02 MED ORDER — WEGOVY 0.5 MG/0.5 ML SUBCUTANEOUS PEN INJECTOR
SUBCUTANEOUS | 2 refills | 28 days | Status: CP
Start: 2022-07-02 — End: ?

## 2022-07-02 NOTE — Unmapped (Addendum)
Peggy Rojas is a 41 y.o. female with Class 3, Stage 1 obesity due to weight gain in her 30s caused by unhealthy lifestyle, including physical inactivity and frequent consumption of ultraprocessed foods and products with high amounts of added sugars. Comorbidities include insulin resistance, vitamin D deficiency. Barriers include nothing specific at this time.    Weight Summary:  Starting weight/BMI/WC/[place]: 257 lbs, BMI 40.82, WC 43, [place] 13 (02/07/2022)  Target weight/goal: No goal weight. Patient wants general improvement in health and appearance.  Today's weight/BMI: (!) 101.9 kg (224 lb 9.6 oz), BMI (!) 35.67 (07/02/2022)   % Body weight loss: 12.8%  Today's Waist Circumference: 38.5 inches (07/02/2022)  Today's visit number: 3rd  Duration in program: 5 mos    PCP is in [employer] through [employer] (private practice.)     Referred by: A friend who is also our patient.    GOALS: 1) Continue healthy eating habits including no juice and little to no takeout  2) Continue physical activity routine  3) Continue Wegovy    I have reviewed the patient's medical history, lifestyle history and labs/tests.   My recommendations include the following:    Lifestyle Pattern Summary: Pt continues healthy lifestyle habits including minimal fast foods and no SSBs. Has also continued going to the gym! Congratulated Pt on great habits.  - See GOALS above.     Sleep: Currently sleeping 7 hours without sleep aids. STOP-BANG negative (2/8).  -- Recommend 7-8 hours of sleep at night.    Stress management: Pt reports having good stress management techniques such as reading.     Weight gain causing medication: None.    Medication: Taking metformin and wegovy as directed. Tolerating well. Denies s/e. Continue current regimen.   CONTINUE Metformin 500 mg twice a day with meals, CONTINUE Wegovy 0.25 mg once weekly injections  - Wegovy: maybe started 02/07/22 at 257 lbs  - Metformin: started 02/07/2022 at 257 lbs.  Tried/Contraindicated:  - Phentermine: WL of 19 lbs, couldn't tolerate d/t headaches and insomnia    Obesity Surgery: Informed Pt about surgical options. Declines for now. Wishes to try medical management but will consider in the future. Pt verbalized that if Reginal Lutes is not covered, she would like to seriously consider surgery. (Updated 02/07/2022)    Medical conditions:  Glaucoma: no  Seizures: no  Medullary thyroid cancer (personal or family hx): no  Multiple Endocrine Neoplasia: no  Palpitations/Tachycardia: no  Chest Pain: no past history of MI.   Headaches/Migraines: no  Nephrolithiasis: no  H/o pancreatitis: no  GERD: no     Prior Surgeries:  Cholecystectomy: no  Hysterectomy: no     Birth Control Methods: OCP

## 2022-07-02 NOTE — Unmapped (Signed)
BP slightly elevated, but improved with WL. Advised Pt to follow up with PCP to ensure BP under good control. Pt verbalized understanding and agreement with plan.

## 2022-07-02 NOTE — Unmapped (Signed)
Reviewed relevant medications and labs. Pt is following our Weight Management Clinic. See Obesity tab for medication changes.

## 2022-07-02 NOTE — Unmapped (Signed)
[facility] Follow Up    Assessment/Plan:     Chief Complaint   Patient presents with    Weight Check       Problem List Items Addressed This Visit          Unprioritized    Class 2 obesity without serious comorbidity with body mass index (BMI) of 35.0 to 35.9 in adult     Peggy Rojas is a 41 y.o. female with Class 3, Stage 1 obesity due to weight gain in her 30s caused by unhealthy lifestyle, including physical inactivity and frequent consumption of ultraprocessed foods and products with high amounts of added sugars. Comorbidities include insulin resistance, vitamin D deficiency. Barriers include nothing specific at this time.    Weight Summary:  Starting weight/BMI/WC/[place]: 257 lbs, BMI 40.82, WC 43, [place] 13 (02/07/2022)  Target weight/goal: No goal weight. Patient wants general improvement in health and appearance.  Today's weight/BMI: (!) 101.9 kg (224 lb 9.6 oz), BMI (!) 35.67 (07/02/2022)   % Body weight loss: 12.8%  Today's Waist Circumference: 38.5 inches (07/02/2022)  Today's visit number: 3rd  Duration in program: 5 mos    PCP is in [employer] through [employer] (private practice.)     Referred by: A friend who is also our patient.    GOALS: 1) Continue healthy eating habits including no juice and little to no takeout  2) Continue physical activity routine  3) Continue Wegovy    I have reviewed the patient's medical history, lifestyle history and labs/tests.   My recommendations include the following:    Lifestyle Pattern Summary: Pt continues healthy lifestyle habits including minimal fast foods and no SSBs. Has also continued going to the gym! Congratulated Pt on great habits.  - See GOALS above.     Sleep: Currently sleeping 7 hours without sleep aids. STOP-BANG negative (2/8).  -- Recommend 7-8 hours of sleep at night.    Stress management: Pt reports having good stress management techniques such as reading.     Weight gain causing medication: None.    Medication: Taking metformin and wegovy as directed. Tolerating well. Denies s/e. Continue current regimen.   CONTINUE Metformin 500 mg twice a day with meals, CONTINUE Wegovy 0.25 mg once weekly injections  - Wegovy: maybe started 02/07/22 at 257 lbs  - Metformin: started 02/07/2022 at 257 lbs.  Tried/Contraindicated:  - Phentermine: WL of 19 lbs, couldn't tolerate d/t headaches and insomnia    Obesity Surgery: Informed Pt about surgical options. Declines for now. Wishes to try medical management but will consider in the future. Pt verbalized that if Reginal Lutes is not covered, she would like to seriously consider surgery. (Updated 02/07/2022)    Medical conditions:  Glaucoma: no  Seizures: no  Medullary thyroid cancer (personal or family hx): no  Multiple Endocrine Neoplasia: no  Palpitations/Tachycardia: no  Chest Pain: no past history of MI.   Headaches/Migraines: no  Nephrolithiasis: no  H/o pancreatitis: no  GERD: no     Prior Surgeries:  Cholecystectomy: no  Hysterectomy: no     Birth Control Methods: OCP         Relevant Medications    WEGOVY 0.5 MG/0.5 ML SUBCUTANEOUS PEN INJECTOR    Essential hypertension     BP slightly elevated, but improved with WL. Advised Pt to follow up with PCP to ensure BP under good control. Pt verbalized understanding and agreement with plan.           Insulin resistance -  Primary     Reviewed relevant medications and labs. Pt is following our Weight Management Clinic. See Obesity tab for medication changes.                Return in about 2 months (around 09/02/2022) for Weight clinic F/U one slot..    I have reviewed and addressed the patient???s adherence and response to prescribed medications. I have identified patient barriers to following the proposed medication and treatment plan, and have noted opportunities to optimize healthy behaviors. I have answered the patient???s questions to satisfaction and the patient voices understanding.    Time: Greater than 50% of this encounter was spent in direct consultation with the patient in evaluation and discussing all of the above. Duration of encounter: 30 minutes.     HPI:     Peggy Rojas is a 41 y.o. female who  has a past medical history of Essential hypertension (02/07/2022) and Obesity ([id number]). who presents today for [facility] follow up.    Weight Management History  Wt Readings from Last 6 Encounters:   07/02/22 (!) 101.9 kg (224 lb 9.6 oz)   05/02/22 (!) 107.5 kg (237 lb 1.3 oz)   02/07/22 (!) 116.6 kg (257 lb)         02/07/2022     1:00 PM 05/02/2022     2:41 PM 07/02/2022    10:28 AM   Waist Circumference   Waist Circumference 43 inches 41.5 inches 38.5 inches         Update: 13 lb loss in 2 mos.   Medication: Pt taking Wegovy 0.5 mg and metformin 500 mg BID as directed.   Eating Pattern:  - Breakfast: Eggs, Oatmeal, and [place] yogurt  - Lunch: Salad, Leftovers, Chicken, Frozen Meal, and Fast food/restaurant/takeout 1 times per week  - Dinner: Salad, Chicken, Fish, Frozen Meal, Veggies, and Fast food/restaurant/takeout 1 times per week  - Drinks: Water and Coffee  - Snacks: Fruit and Nuts  Physical Activity:  - Type: Walk and Lift weights, Frequency: 45 mins 2-4 times per week   Barrier: nothing specific    Lab Results   Component Value Date    LDL 95 02/07/2022    HDL 56 02/07/2022    A1C 5.5 02/07/2022    GLU 87 02/07/2022    TSH 1.103 02/07/2022    AST 23 02/07/2022    ALT 24 02/07/2022     Past Medical/Surgical History:     Past Medical History:   Diagnosis Date    Essential hypertension 02/07/2022    Obesity [id number]     Past Surgical History:   Procedure Laterality Date    BREAST SURGERY  2016       Social History:     Social History     Socioeconomic History    Marital status: Married     Spouse name: None    Number of children: None    Years of education: None    Highest education level: None   Tobacco Use    Smoking status: Never    Smokeless tobacco: Never   Vaping Use    Vaping Use: Never used   Substance and Sexual Activity    Alcohol use: Not Currently    Drug use: Never    Sexual activity: Yes     Partners: Male     Birth control/protection: Diaphragm       Family History:     History reviewed. No pertinent family history.  Allergies:     Patient has no known allergies.    Current Medications:     Current Outpatient Medications   Medication Sig Dispense Refill    adalimumab-bwwd (HADLIMA,CF, PUSHTOUCH) 40 mg/0.4 mL AtIn Inject the contents of 1 pen (40 mg total) under the skin every fourteen (14) days. 2.4 mL 5    chlorthalidone (HYGROTON) 25 MG tablet Take 1 tablet (25 mg total) by mouth every morning. 90 tablet 3    empty container Misc Use as directed to dispose of injectable medications 1 each 3    ibuprofen (MOTRIN) 600 MG tablet Take by mouth every hour as needed.      LARIN 24 FE 1 mg-20 mcg (24)/75 mg (4) Tab Take 1 tablet by mouth daily.      metFORMIN (GLUCOPHAGE-XR) 500 MG 24 hr tablet Take 1 tablet (500 mg total) by mouth Two (2) times a day. 120 tablet 3    WEGOVY 0.5 MG/0.5 ML SUBCUTANEOUS PEN INJECTOR Inject the contents of 1 pen (0.5 mg) under the skin every seven (7) days. 4 each 2     No current facility-administered medications for this visit.       I have reviewed and (if needed) updated the patient's problem list, medications, allergies, past medical and surgical history, social and family history.    ROS:     Unless otherwise stated in the HPI:  CONSTITUTIONAL: no fever chills.   HEENT: Eyes: No diplopia or blurred vision. ENT: No earache, sore throat or runny nose.   CARDIOVASCULAR: No pressure, squeezing, strangling, tightness, heaviness or aching about the chest, neck, axilla or epigastrium.   RESPIRATORY: No cough, shortness of breath, PND or orthopnea.   GASTROINTESTINAL: No nausea, vomiting or diarrhea.   GENITOURINARY: No dysuria, frequency or urgency.   MUSCULOSKELETAL: no new pains, no joint swelling or redness.   SKIN: No change in skin, hair or nails.   NEUROLOGIC: No paresthesias, fasciculations, seizures or weakness.   PSYCHIATRIC: No disorder of thought or mood.   ENDOCRINE: No heat or cold intolerance, polyuria or polydipsia.   HEMATOLOGICAL: No easy bruising or bleeding.    Vital Signs:     Body mass index is 35.67 kg/m??. Waist Circumference: 38.5 inches    Wt Readings from Last 3 Encounters:   07/02/22 (!) 101.9 kg (224 lb 9.6 oz)   05/02/22 (!) 107.5 kg (237 lb 1.3 oz)   02/07/22 (!) 116.6 kg (257 lb)     Temp Readings from Last 3 Encounters:   07/02/22 36.4 ??C (97.6 ??F) (Temporal)   05/02/22 36.6 ??C (97.9 ??F)   02/07/22 36.4 ??C (97.6 ??F)     BP Readings from Last 3 Encounters:   07/02/22 146/89   05/02/22 155/89   02/07/22 160/97     Pulse Readings from Last 3 Encounters:   07/02/22 73   05/02/22 81   02/07/22 79         02/07/2022     1:00 PM 05/02/2022     2:41 PM 07/02/2022    10:28 AM   Waist Circumference   Waist Circumference 43 inches 41.5 inches 38.5 inches          Physical Exam:     General: well appearing, in NAD, Body mass index is 35.67 kg/m??. Ambulatory without help.  Body fat distribution: General adiposity. No supraclavicular adiposity. No dorsal adiposity. Waist Circumference: 38.5 inches     Head: normocephalic atraumatic.  Eyes: PERRLA, EOMI, Sclera WNL.   Oral  pharynx: moist, no exudate, no erythema, not enlarged. Good dental hygiene. Moderate OP crowding.  Neck: supple, no LAD, no thyromegaly, no bruit.  CV: RRR no rubs or murmurs. Peripheral edema: none.  Lungs: clear bilaterally to auscultation. No wheezing.  Abdomen: soft, NT/ND. No intertrigo. Moderate pannus. No hepatomegaly.   Extremities: no clubbing, cyanosis, No edema.  Skin: no concerning lesions, rashes observed. No lipomas, moderate acanthosis nigricans.  Neuro: Alert and oriented X 3.    Labs:     No visits with results within 1 Month(s) from this visit.   Latest known visit with results is:   Office Visit on 02/07/2022   Component Date Value Ref Range Status    TSH 02/07/2022 1.103  0.550 - 4.780 uIU/mL Final    Triglycerides 02/07/2022 80  0 - 150 mg/dL Final    Cholesterol 08/65/7846 167  <=200 mg/dL Final    HDL 96/29/5284 56  40 - 60 mg/dL Final    LDL Calculated 02/07/2022 95  40 - 99 mg/dL Final    NHLBI Recommended Ranges, LDL Cholesterol, for Adults (20+yrs) (ATPIII), mg/dL  Optimal              <132  Near Optimal        100-129  Borderline High     130-159  High                160-189  Very High            >=190  NHLBI Recommended Ranges, LDL Cholesterol, for Children (2-19 yrs), mg/dL  Desirable            <440  Borderline High     110-129  High                 >=130      VLDL Cholesterol Cal 02/07/2022 16  9 - 37 mg/dL Final    Chol/HDL Ratio 02/07/2022 3.0  1.0 - 4.5 Final    Non-HDL Cholesterol 02/07/2022 111  70 - 130 mg/dL Final    Non-HDL Cholesterol Recommended Ranges (mg/dL)  Optimal       <102  Near Optimal 130 - 159  Borderline High 160 - 189  High             190 - 219  Very High       >220      FASTING 02/07/2022 No   Final    Hemoglobin A1C 02/07/2022 5.5  4.8 - 5.6 % Final    Estimated Average Glucose 02/07/2022 111  mg/dL Final    Sodium 72/53/6644 141  135 - 145 mmol/L Final    Potassium 02/07/2022 3.9  3.4 - 4.8 mmol/L Final    Chloride 02/07/2022 109 (H)  98 - 107 mmol/L Final    CO2 02/07/2022 25.4  20.0 - 31.0 mmol/L Final    Anion Gap 02/07/2022 7  5 - 14 mmol/L Final    BUN 02/07/2022 11  9 - 23 mg/dL Final    Creatinine 03/47/4259 0.75  0.60 - 0.80 mg/dL Final    BUN/Creatinine Ratio 02/07/2022 15   Final    eGFR CKD-EPI (2021) Female 02/07/2022 >90  >=60 mL/min/1.42m2 Final    eGFR calculated with CKD-EPI 2021 equation in accordance with [employer] and [employer] of Nephrology Task Force recommendations.    Glucose 02/07/2022 87  70 - 179 mg/dL Final    Calcium 56/38/7564 9.7  8.7 - 10.4 mg/dL Final  Albumin 02/07/2022 3.8  3.4 - 5.0 g/dL Final    Total Protein 02/07/2022 7.6  5.7 - 8.2 g/dL Final    Total Bilirubin 02/07/2022 0.2 (L)  0.3 - 1.2 mg/dL Final    AST 52/84/1324 23  <=34 U/L Final    ALT 02/07/2022 24  10 - 49 U/L Final    Alkaline Phosphatase 02/07/2022 59  46 - 116 U/L Final       Follow-up:     Return in about 2 months (around 09/02/2022) for Weight clinic F/U one slot..    I attest that I, Constance Goltz, personally documented this note while acting as scribe for Marshell Garfinkel, MD.      Constance Goltz, Scribe.  07/02/2022     The documentation recorded by the scribe accurately reflects the service I personally performed and the decisions made by me.    Marshell Garfinkel, MD

## 2022-07-19 NOTE — Unmapped (Signed)
[place] Assessment of Medications Program (CAMP)  RECRUITMENT SUMMARY NOTE   [employer] ([employer]P) Hypertension      Patient was outreached to reschedule CAMP appointment. Patient rescheduled.    []  Remind patient to have blood pressure and heart rate readings available for pharmacist visit.         Alberteen Sam, CPhT   Certified Pharmacy Technician   [place] Assessment of Medications Program (CAMP)   P [phone], F [phone]

## 2022-07-29 DIAGNOSIS — Z6841 Body Mass Index (BMI) 40.0 and over, adult: Principal | ICD-10-CM

## 2022-07-29 DIAGNOSIS — E661 Drug-induced obesity: Principal | ICD-10-CM

## 2022-07-29 MED ORDER — WEGOVY 0.5 MG/0.5 ML SUBCUTANEOUS PEN INJECTOR
SUBCUTANEOUS | 2 refills | 28 days
Start: 2022-07-29 — End: ?

## 2022-07-30 MED ORDER — WEGOVY 0.5 MG/0.5 ML SUBCUTANEOUS PEN INJECTOR
SUBCUTANEOUS | 2 refills | 28 days
Start: 2022-07-30 — End: ?

## 2022-07-30 NOTE — Unmapped (Signed)
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 Do you want to titrate up?

## 2022-07-30 NOTE — Unmapped (Signed)
Patient called in to let clinic know the [facility] .5 medication is currently on back order but patient was informed by her pharmacy that there is a higher dosage available but Dr.Ro would have to write the prescription for the higher dosage please advise patient on this matter

## 2022-07-31 MED ORDER — WEGOVY 1 MG/0.5 ML SUBCUTANEOUS PEN INJECTOR
SUBCUTANEOUS | 2 refills | 0.00000 days | Status: CP
Start: 2022-07-31 — End: 2022-07-31
  Filled 2022-08-02: qty 4, 28d supply, fill #0

## 2022-07-31 NOTE — Unmapped (Signed)
Addended by: Cheri Rous F on: 07/31/2022 02:53 PM     Modules accepted: Orders

## 2022-07-31 NOTE — Unmapped (Signed)
Changed per pts request, notified via mychart

## 2022-07-31 NOTE — Unmapped (Signed)
Pt notified via mychart

## 2022-07-31 NOTE — Unmapped (Signed)
Patient is requesting medication WEGOVY 1 MG/0.5 ML SUBCUTANEOUS PEN INJECTOR to be sent to [facility] on  [address] [place], [place], [place] [address]. Please advise patient.

## 2022-08-01 NOTE — Unmapped (Signed)
Addended by: Cheri Rous F on: 07/31/2022 04:25 PM     Modules accepted: Orders

## 2022-08-01 NOTE — Unmapped (Signed)
[facility] Refill Coordination Note    Specialty Medication(s) to be Shipped:   Inflammatory Disorders: Hadlima    Other medication(s) to be shipped: WEGOVY 1 mg/0.5 mL injection pen (semaglutide (weight loss))     Peggy Rojas, DOB: 1981/03/22  Phone: There are no phone numbers on file.      All above HIPAA information was verified with patient.     Was a Nurse, learning disability used for this call? No    Completed refill call assessment today to schedule patient's medication shipment from the [facility] [phone]).  All relevant notes have been reviewed.     Specialty medication(s) and dose(s) confirmed: Regimen is correct and unchanged.   Changes to medications: S[place] reports no changes at this time.  Changes to insurance: No  New side effects reported not previously addressed with a pharmacist or physician: None reported  Questions for the pharmacist: No    Confirmed patient received a Conservation officer, historic buildings and a Surveyor, mining with first shipment. The patient will receive a drug information handout for each medication shipped and additional FDA Medication Guides as required.       DISEASE/MEDICATION-SPECIFIC INFORMATION        For patients on injectable medications: Patient currently has 1 doses left.  Next injection is scheduled for 1/18.    SPECIALTY MEDICATION ADHERENCE     Medication Adherence    Patient reported X missed doses in the last month: 0  Specialty Medication: HADLIMA(CF) PUSHTOUCH 40 mg/0.4 mL  Patient is on additional specialty medications: No                          Were doses missed due to medication being on hold? No    Hadlima 40 mg/ml: 1 days of medicine on hand        REFERRAL TO PHARMACIST     Referral to the pharmacist: Not needed      [facility]     Shipping address confirmed in Epic.     Delivery Scheduled: Yes, Expected medication delivery date: 08/02/22.     Medication will be delivered via Same Day Courier to the prescription address in Epic WAM.    [name]   [facility] Specialty Technician

## 2022-08-02 MED FILL — HADLIMA(CF) PUSHTOUCH 40 MG/0.4 ML SUBCUTANEOUS AUTO-INJECTOR: SUBCUTANEOUS | 28 days supply | Qty: 0.8 | Fill #2

## 2022-08-13 NOTE — Unmapped (Unsigned)
[place] Assessment of Medications Program (CAMP) Clinic  Hypertension Targeted - Initial Visit   Network Health Plan (NHP)     Name: Peggy Rojas  Date of Birth: 11-17-1980  Today's Date: 08/14/2022  Age: 42 y.o.    To patients reading this note: Please be advised that the primary purpose of this note is for [place] to keep track of your care and communicate with other members of your medical team. These suggested changes are intended to enhance the overall care you receive.  Approved changes will be communicated to you by your care team.     Provider Recommendations    {CAMPAbbrevRationale:100611} {CAMP Recommendations 2.0:93072}   {CAMP Abbrev Rationale 2:101832}{CAMP Recs 2.1 (Optional):101834}  {CAMP Abbrev Rationale 2:101832}{CAMP Recs 2.1 (Optional):101834}       Patient Summary      Hypertension  Patient reported treatment: chlorthalidone 25 mg daily (started 02/07/22)  Previous therapies: none***  Monitoring: {Hypertension Home BP:99027}  Hypotension symptoms: {CAMP Hypotension Symptoms:99028}  Hypertension symptoms: {CAMP Hypertension Symptoms:99029}  Diet: working with weight management clinic (on [address]) ***  Exercise: {Blank single:49760::no}    Assessment / Plan  BP Readings from Last 3 Encounters:   07/02/22 146/89   05/02/22 155/89   02/07/22 160/97           Pulse Readings from Last 3 Encounters:   07/02/22 73   05/02/22 81   02/07/22 79      Currently {control level:39635}. Goal blood pressure of 130/80 per 2017 ACC/AHA HTN Guidelines.   Specific recommendations to provider noted above  {Hypertension Plan:99030}  _____________________________________________________    Medication Management   Medications reviewed with patient & education provided regarding medications and CAMP Clinic. Reviewed the indication, dose, frequency, and affordability concerns (if applicable).    {campcomplete:63036}    Notes and recommendations from today's visit have been sent to the appropriate provider(s)    {CPP Attestation (Optional):98692:::1}  Elih Mooney Snellings Beckett Maden, CPP, PharmD  Clinical Pharmacist- [place] Assessment of Medications Program (CAMP)  CAMP Clinic: [phone] - Fax: [phone]    {    Coding tips - Do not edit this text, it will delete upon signing of note!    Telephone visits [phone] for Physicians and APPs and [phone] for Non- Physician Clinicians)- Only use minutes on the phone to determine level of service.    Video visits [phone]) - Use either level of medical decision making just as an in-person visit OR time which includes both minutes on video and pre/post minutes to determine the level of service.      :75688}  The patient reports they are physically located in [place] and is currently: {patient location:[address]90}. I conducted a phone visit.  I spent *** minutes on the phone call with the patient on the date of service .     Future Appointments   Date Time Provider Department Center   08/14/2022  2:30 PM Lord Lancour, Janice Norrie, CPP [facility]   09/03/2022 10:00 AM Ro, Honor Junes, MD [id number]   10/30/2022 10:00 AM Ro, Honor Junes, MD [id number]   01/01/2023 10:00 AM Ro, Honor Junes, MD [id number]      Current Medications:     Current Outpatient Medications on File Prior to Visit   Medication Sig Dispense Refill    adalimumab-bwwd (HADLIMA,CF, PUSHTOUCH) 40 mg/0.4 mL AtIn Inject the contents of 1 pen (40 mg total) under the skin every fourteen (14) days. 2.4 mL 5  chlorthalidone (HYGROTON) 25 MG tablet Take 1 tablet (25 mg total) by mouth every morning. 90 tablet 3    empty container Misc Use as directed to dispose of injectable medications 1 each 3    ibuprofen (MOTRIN) 600 MG tablet Take by mouth every hour as needed.      LARIN 24 FE 1 mg-20 mcg (24)/75 mg (4) Tab Take 1 tablet by mouth daily.      metFORMIN (GLUCOPHAGE-XR) 500 MG 24 hr tablet Take 1 tablet (500 mg total) by mouth Two (2) times a day. 120 tablet 3    WEGOVY 1 MG/0.5 ML SUBCUTANEOUS PEN INJECTOR Inject 1 mg under the skin every seven (7) days. 4 each 2     No current facility-administered medications on file prior to visit.

## 2022-08-14 NOTE — Unmapped (Signed)
[place] Assessment of Medications Program (CAMP) Clinic    Name: Peggy Rojas  Date of Birth: 13-Sep-1980  Today's Date: 08/14/2022  Age: 42 y.o.         Patient unable to complete scheduled CAMP visit at this time. Will be outreached for rescheduling, please contact clinic at number below with any further questions.          Kariana Wiles Snellings Merrilyn Legler, CPP, PharmD  Clinical Pharmacist- [place] Assessment of Medications Program (CAMP)  CAMP Clinic: [phone] - Fax: [phone]

## 2022-08-16 NOTE — Unmapped (Signed)
[place] Assessment of Medications Program (CAMP)  RECRUITMENT SUMMARY NOTE   Network Health Plan (NHP) Hypertension      Patient was outreached to reschedule CAMP appointment. No show letter sent.    []  Remind patient to have blood pressure and heart rate readings available for pharmacist visit.         Alberteen Sam, CPhT   Certified Pharmacy Technician   [place] Assessment of Medications Program (CAMP)   P [phone], F [phone]

## 2022-08-22 NOTE — Unmapped (Signed)
[facility] Refill Coordination Note    Specialty Medication(s) to be Shipped:   Inflammatory Disorders: Hadlima    Other medication(s) to be shipped:  [facility] 1 mg     Peggy Rojas, DOB: 1980/12/19  Phone: There are no phone numbers on file.      All above HIPAA information was verified with patient.     Was a Nurse, learning disability used for this call? No    Completed refill call assessment today to schedule patient's medication shipment from the [facility] [phone]).  All relevant notes have been reviewed.     Specialty medication(s) and dose(s) confirmed: Regimen is correct and unchanged.   Changes to medications: [name] reports no changes at this time.  Changes to insurance: No  New side effects reported not previously addressed with a pharmacist or physician: None reported  Questions for the pharmacist: No    Confirmed patient received a Conservation officer, historic buildings and a Surveyor, mining with first shipment. The patient will receive a drug information handout for each medication shipped and additional FDA Medication Guides as required.       DISEASE/MEDICATION-SPECIFIC INFORMATION        For patients on injectable medications: Patient currently has 0 doses left.  Next injection is scheduled for 08/29/22.    SPECIALTY MEDICATION ADHERENCE              Were doses missed due to medication being on hold? No    Hadlima 40 mg/ml: 7 days of medicine on hand       REFERRAL TO PHARMACIST     Referral to the pharmacist: Not needed      [name]     Shipping address confirmed in Epic.     Delivery Scheduled: Yes, Expected medication delivery date: 08/27/22.     Medication will be delivered via Same Day Courier to the prescription address in Epic WAM.    Sherral Hammers, PharmD   [name] Specialty Pharmacist

## 2022-08-26 DIAGNOSIS — H209 Unspecified iridocyclitis: Principal | ICD-10-CM

## 2022-08-26 DIAGNOSIS — D869 Sarcoidosis, unspecified: Principal | ICD-10-CM

## 2022-08-27 ENCOUNTER — Encounter: Payer: Self-pay | Admitting: Physician Assistant

## 2022-08-27 ENCOUNTER — Ambulatory Visit (INDEPENDENT_AMBULATORY_CARE_PROVIDER_SITE_OTHER): Payer: BC Managed Care – PPO | Admitting: Physician Assistant

## 2022-08-27 VITALS — BP 138/89 | HR 86 | Ht 68.0 in | Wt 216.3 lb

## 2022-08-27 DIAGNOSIS — D869 Sarcoidosis, unspecified: Secondary | ICD-10-CM

## 2022-08-27 DIAGNOSIS — R030 Elevated blood-pressure reading, without diagnosis of hypertension: Secondary | ICD-10-CM

## 2022-08-27 DIAGNOSIS — R7303 Prediabetes: Secondary | ICD-10-CM

## 2022-08-27 DIAGNOSIS — E559 Vitamin D deficiency, unspecified: Secondary | ICD-10-CM | POA: Diagnosis not present

## 2022-08-27 DIAGNOSIS — Z7689 Persons encountering health services in other specified circumstances: Secondary | ICD-10-CM

## 2022-08-27 MED FILL — HADLIMA(CF) PUSHTOUCH 40 MG/0.4 ML SUBCUTANEOUS AUTO-INJECTOR: SUBCUTANEOUS | 28 days supply | Qty: 0.8 | Fill #3

## 2022-08-27 MED FILL — WEGOVY 1 MG/0.5 ML SUBCUTANEOUS PEN INJECTOR: SUBCUTANEOUS | 28 days supply | Qty: 4 | Fill #1

## 2022-08-27 NOTE — Progress Notes (Unsigned)
I,Sha'taria Tyson,acting as a Education administrator for [employer], [name].,have documented all relevant documentation on the behalf of [name], [name],as directed by  [employer], [name] while in the presence of [employer], [name].  New patient visit   Patient: Marie Moran   DOB: 02/16/1981   42 y.o. Female  MRN: [id number] Visit Date: 08/27/2022  Today's healthcare provider: [name], [name]   CC: establish care and chronic med conditions fu  Subjective    KHEPRI FORWOOD is a 42 y.o. female who presents today as a new patient to establish care.   HPI  Prediabetes Her A1c from 02/07/2022 was 5.5, she is currently on metformin 500 mg twice daily.    Obesity Patient reports that she is participating at weight loss program managed by Dr. 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  She is currently on Wegovy 1 mg weekly.  Sarcoidosis  Patient was diagnosed with sarcoidosis .  Her symptoms included shortness of breath, muscle pain, joint pain /aches .  She well-controlled on HADLIMA injections, she is managed by Dr. Posey Pronto from [facility] rheumatology, she was scheduled with him in March of 2024 .  She was seen by OB/GYN at the [facility].  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   [place] , dr.  Past Medical History:  Diagnosis Date   Fibroadenoma of both breasts    GERD (gastroesophageal reflux disease)    Pre-diabetes    Sarcoidosis    2009   Vitamin D deficiency    Past Surgical History:  Procedure Laterality Date   BREAST BIOPSY Right neg   2002 Dr. Larinda Buttery  excisional   BREAST BIOPSY Bilateral 03/06/2015   Procedure: BILATERAL BREAST MASS EXCISION, right breast mass excision with ultrasound guided needle localization ;  Surgeon: Christene Lye, MD;  Location: [facility] ORS;  Service: General;  Laterality: Bilateral;   BREAST BIOPSY Right  2021   coil clip, [place] BX,  PASH stromal fibrosis   BREAST EXCISIONAL BIOPSY     COLONOSCOPY  2011   [place] OF UTERUS  2007   DILATION AND CURETTAGE OF UTERUS N/A 07/05/2017   Procedure: DILATATION AND CURETTAGE;  Surgeon: Homero Fellers, MD;  Location: [facility] ORS;  Service: Gynecology;  Laterality: N/A;   mass removed  2002   TONSILLECTOMY     UPPER GI ENDOSCOPY  2007   WISDOM TOOTH EXTRACTION  2006   Family Status  Relation Name Status   MGM  Deceased   MGF  Deceased   Cousin Paternal Deceased   Other MGGaunt Deceased   Family History  Problem Relation Age of Onset   Heart disease Maternal Grandmother    Hypertension Maternal Grandmother    Pancreatic cancer Maternal Grandfather 35   Pancreatic cancer Cousin        69s   Breast cancer Other 79   Social History   Socioeconomic History   Marital status: Married    Spouse name: Not on file   Number of children: Not on file   Years of education: Not on file   Highest education level: Not on file  Occupational History   Not on file  Tobacco Use   Smoking status: Never   Smokeless tobacco: Never  Vaping Use   Vaping Use: Never used  Substance and Sexual Activity  Alcohol use: Yes    Alcohol/week: 2.0 standard drinks of alcohol    Types: 2 Glasses of wine per week    Comment: Occasionally   Drug use: No   Sexual activity: Yes    Birth control/protection: Pill  Other Topics Concern   Not on file  Social History Narrative   Not on file   Social Determinants of Health   Financial Resource Strain: Not on file  Food Insecurity: Not on file  Transportation Needs: Not on file  Physical Activity: Not on file  Stress: Not on file  Social Connections: Not on file   Outpatient Medications Prior to Visit  Medication Sig   Adalimumab-bwwd (HADLIMA PUSHTOUCH) 40 MG/0.4ML SOAJ Inject into the skin.   chlorthalidone (HYGROTON) 25 MG tablet Take 25 mg by mouth every morning.    ibuprofen (ADVIL,MOTRIN) 600 MG tablet Take by mouth.   metFORMIN (GLUCOPHAGE-XR) 500 MG 24 hr tablet Take 500 mg by mouth 2 (two) times daily.   Norethindrone Acetate-Ethinyl Estrad-FE (LARIN 24 FE) 1-20 MG-MCG(24) tablet Take 1 tablet by mouth daily.   Semaglutide-Weight Management (WEGOVY) 1 MG/0.5ML SOAJ Inject into the skin.   Radiation protection practitioner (GUARDIAN SHARPS COLLECTOR) MISC Use as directed to dispose of injectable medications   Adalimumab (HUMIRA PEN) 40 MG/0.4ML PNKT Inject into the skin. (Patient not taking: Reported on 08/27/2022)   Ascorbic Acid (VITAMIN C) 100 MG tablet Take 100 mg by mouth daily. (Patient not taking: Reported on 08/27/2022)   ferrous sulfate 325 (65 FE) MG EC tablet Take 325 mg by mouth 3 (three) times daily with meals. (Patient not taking: Reported on 08/27/2022)   Vitamin D, Ergocalciferol, (DRISDOL) 1.25 MG (50000 UNIT) CAPS capsule Take 50,000 Units by mouth once a week. (Patient not taking: Reported on 08/27/2022)   No facility-administered medications prior to visit.   No Known Allergies  Immunization History  Administered Date(s) Administered   Influenza Inj Mdck Quad Pf 03/24/2019   Influenza,inj,Quad PF,6+ Mos 04/29/2017, 09/07/2020, 05/02/2022   Influenza-Unspecified 03/22/2019   PFIZER(Purple Top)SARS-COV-2 Vaccination 03/28/2020, 04/18/2020   Pneumococcal Polysaccharide-23 02/10/2019   Tdap 10/25/2020    Health Maintenance  Topic Date Due   HIV Screening  Never done   Hepatitis C Screening  Never done   COVID-19 Vaccine (3 - Pfizer risk series) 05/16/2020   PAP SMEAR-Modifier  09/10/2024   DTaP/Tdap/Td (2 - Td or Tdap) 10/26/2030   INFLUENZA VACCINE  Completed   HPV VACCINES  Aged Out    Patient Care Team: [name], FNP as PCP - General (Family Medicine) Christene Lye, MD (General Surgery) Copland, Deirdre Evener, [name] as Referring Physician (Physician Assistant)  Review of Systems  All other systems reviewed and are  negative. See HPI     Objective    BP 138/89 (BP Location: Right Arm, Patient Position: Sitting, Cuff Size: Large)   Pulse 86   Ht 5' 8"$  (1.727 m)   Wt 216 lb 4.8 oz (98.1 kg)   SpO2 100%   BMI 32.89 kg/m    Physical Exam Vitals reviewed.  Constitutional:      General: She is not in acute distress.    Appearance: Normal appearance. She is well-developed. She is obese. She is not diaphoretic.  HENT:     Head: Normocephalic and atraumatic.     Nose: Nose normal.  Eyes:     General: No scleral icterus.    Extraocular Movements: Extraocular movements intact.     Conjunctiva/sclera: Conjunctivae normal.  Pupils: Pupils are equal, round, and reactive to light.  Neck:     Thyroid: No thyromegaly.  Cardiovascular:     Rate and Rhythm: Normal rate and regular rhythm.     Pulses: Normal pulses.     Heart sounds: Normal heart sounds. No murmur heard. Pulmonary:     Effort: Pulmonary effort is normal. No respiratory distress.     Breath sounds: Normal breath sounds. No wheezing, rhonchi or rales.  Abdominal:     General: Abdomen is flat. Bowel sounds are normal.     Palpations: Abdomen is soft.  Musculoskeletal:        General: Normal range of motion.     Cervical back: Normal range of motion and neck supple.     Right lower leg: No edema.     Left lower leg: No edema.  Lymphadenopathy:     Cervical: No cervical adenopathy.  Skin:    General: Skin is warm and dry.     Findings: No rash.  Neurological:     Mental Status: She is alert and oriented to person, place, and time. Mental status is at baseline.  Psychiatric:        Mood and Affect: Mood normal.        Behavior: Behavior normal.        Thought Content: Thought content normal.        Judgment: Judgment normal.      Depression Screen    08/27/2022    2:19 PM  PHQ 2/9 Scores  PHQ - 2 Score 1  PHQ- 9 Score 1   No results found for any visits on 08/27/22.  Assessment & Plan      Elevated BP without  diagnosis of hypertension Chronic, was previously unstable Well-controlled now on chlorthalidone 25 mg BP today was WNL Continue current regimen Advised an adherence to low sodium diet and daily exercise Advised to measure blood pressure at home Her lipid panel 6 mo ago was WNL with LDL 95 The 10-year ASCVD risk score (Arnett DK, et al., 2019) is: 2.3%  Will reassess in April  Prediabetes Chronic and stable Last A1c was 5.5 6 mo ago Continue taking metformin 500 mcg twice daily Continue low-carb diet and daily exercises Continue weight loss program [facility] weight management Her current BMI 32.89. Per pt , she lost 40 pounds through the weight management program. Continue Wegovy 1 mg weekly for weight control and blood sugar control Will reassess in April  Sarcoidosis Chronic , well-controlled Continue Hadlima 40 Mg injection Continue to be managed by rheumatology Will follow-up in April  Encounter to establish care Advised to sign a release form for her old records from previous provider Patient states she is up-to-date with all of her screening and immunization. Labs were reviewed from 02/07/2022 Advised to release all future labs from Dr. Gracelyn Nurse loss program Pt was advised to request a med refill a couple of weeks ahead of time   Return in about 3 months (around 11/25/2022) for CPE.    The patient was advised to call back or seek an in-person evaluation if the symptoms worsen or if the condition fails to improve as anticipated.  I discussed the assessment and treatment plan with the patient. The patient was provided an opportunity to ask questions and all were answered. The patient agreed with the plan and demonstrated an understanding of the instructions.  I, [name], [name] have reviewed all documentation for this visit. The documentation on  08/27/22 for  the exam, diagnosis, procedures, and orders are all accurate and complete.  [name], [facility], [place] [phone] (phone) [phone] (fax)

## 2022-08-29 DIAGNOSIS — R030 Elevated blood-pressure reading, without diagnosis of hypertension: Secondary | ICD-10-CM | POA: Insufficient documentation

## 2022-08-29 DIAGNOSIS — R7303 Prediabetes: Secondary | ICD-10-CM | POA: Insufficient documentation

## 2022-08-29 DIAGNOSIS — Z7689 Persons encountering health services in other specified circumstances: Secondary | ICD-10-CM | POA: Insufficient documentation

## 2022-09-03 ENCOUNTER — Ambulatory Visit
Admit: 2022-09-03 | Discharge: 2022-09-04 | Payer: PRIVATE HEALTH INSURANCE | Attending: Family Medicine | Primary: Family Medicine

## 2022-09-03 DIAGNOSIS — E669 Obesity, unspecified: Principal | ICD-10-CM

## 2022-09-03 DIAGNOSIS — E88819 Insulin resistance: Principal | ICD-10-CM

## 2022-09-03 DIAGNOSIS — Z6835 Body mass index (BMI) 35.0-35.9, adult: Principal | ICD-10-CM

## 2022-09-03 MED ORDER — WEGOVY 1 MG/0.5 ML SUBCUTANEOUS PEN INJECTOR
SUBCUTANEOUS | 2 refills | 28 days | Status: CP
Start: 2022-09-03 — End: ?
  Filled 2022-10-02: qty 2, 28d supply, fill #0

## 2022-09-03 NOTE — Unmapped (Signed)
Peggy Rojas is a 42 y.o. female with Hx of Class 3, Stage 1 obesity due to weight gain in her 30s caused by unhealthy lifestyle, including physical inactivity and frequent consumption of ultraprocessed foods and products with high amounts of added sugars. Comorbidities include insulin resistance, vitamin D deficiency. Barriers include nothing specific at this time.    Weight Summary:  Starting weight/BMI/WC/[place]: 257 lbs, BMI 40.82, WC 43, [place] 13 (02/07/2022)  Target weight/goal: No goal weight. Patient wants general improvement in health and appearance.  Today's weight/BMI: 96.5 kg (212 lb 12.8 oz), BMI (!) 33.8 (09/03/2022)   % Body weight loss: 17.5%  Today's Waist Circumference: 38 inches (09/03/2022)    PCP is in [place] through [employer] (private practice.)     Referred by: A friend who is also our patient.    GOALS: 1) Continue healthy eating habits  2) Continue physical activity routine  3) Continue Wegovy    I have reviewed the patient's medical history, lifestyle history and labs/tests.   My recommendations include the following:    Lifestyle Pattern Summary: Pt continues healthy lifestyle habits including minimal fast foods and no SSBs. Has also continued going to the gym! Congratulated Pt on great habits.  - See GOALS above.     Sleep: Currently sleeping 7 hours without sleep aids. STOP-BANG negative (2/8).  -- Recommend 7-8 hours of sleep at night.    Stress management: Pt reports having good stress management techniques such as reading.     Weight gain causing medication: None.    Medication: Taking metformin and wegovy as directed. Tolerating well. Denies s/e. Continue current regimen.   CONTINUE Metformin 500 mg twice a day with meals, CONTINUE Wegovy 1.0 mg once weekly injections  - Wegovy: maybe started 02/07/22 at 257 lbs  - Metformin: started 02/07/2022 at 257 lbs.  Tried/Contraindicated:  - Phentermine: WL of 19 lbs, couldn't tolerate d/t headaches and insomnia    Obesity Surgery: Informed Pt about surgical options. Declines for now. Wishes to try medical management but will consider in the future. Pt verbalized that if Reginal Lutes is not covered, she would like to seriously consider surgery. (Updated 02/07/2022)    Medical conditions:  Glaucoma: no  Seizures: no  Medullary thyroid cancer (personal or family hx): no  Multiple Endocrine Neoplasia: no  Palpitations/Tachycardia: no  Chest Pain: no past history of MI.   Headaches/Migraines: no  Nephrolithiasis: no  H/o pancreatitis: no  GERD: no     Prior Surgeries:  Cholecystectomy: no  Hysterectomy: no     Birth Control Methods: OCP

## 2022-09-03 NOTE — Unmapped (Signed)
[facility] Follow Up    Assessment/Plan:     Chief Complaint   Patient presents with    Follow-up     Wt mngmt       Problem List Items Addressed This Visit          Unprioritized    Class 1 obesity without serious comorbidity in adult     Peggy Rojas is a 42 y.o. female with Hx of Class 3, Stage 1 obesity due to weight gain in her 30s caused by unhealthy lifestyle, including physical inactivity and frequent consumption of ultraprocessed foods and products with high amounts of added sugars. Comorbidities include insulin resistance, vitamin D deficiency. Barriers include nothing specific at this time.    Weight Summary:  Starting weight/BMI/WC/[place]: 257 lbs, BMI 40.82, WC 43, [place] 13 (02/07/2022)  Target weight/goal: No goal weight. Patient wants general improvement in health and appearance.  Today's weight/BMI: 96.5 kg (212 lb 12.8 oz), BMI (!) 33.8 (09/03/2022)   % Body weight loss: 17.5%  Today's Waist Circumference: 38 inches (09/03/2022)    PCP is in [employer] through [employer] (private practice.)     Referred by: A friend who is also our patient.    GOALS: 1) Continue healthy eating habits  2) Continue physical activity routine  3) Continue Wegovy    I have reviewed the patient's medical history, lifestyle history and labs/tests.   My recommendations include the following:    Lifestyle Pattern Summary: Pt continues healthy lifestyle habits including minimal fast foods and no SSBs. Has also continued going to the gym! Congratulated Pt on great habits.  - See GOALS above.     Sleep: Currently sleeping 7 hours without sleep aids. STOP-BANG negative (2/8).  -- Recommend 7-8 hours of sleep at night.    Stress management: Pt reports having good stress management techniques such as reading.     Weight gain causing medication: None.    Medication: Taking metformin and wegovy as directed. Tolerating well. Denies s/e. Continue current regimen.   CONTINUE Metformin 500 mg twice a day with meals, CONTINUE Wegovy 1.0 mg once weekly injections  - Wegovy: maybe started 02/07/22 at 257 lbs  - Metformin: started 02/07/2022 at 257 lbs.  Tried/Contraindicated:  - Phentermine: WL of 19 lbs, couldn't tolerate d/t headaches and insomnia    Obesity Surgery: Informed Pt about surgical options. Declines for now. Wishes to try medical management but will consider in the future. Pt verbalized that if Reginal Lutes is not covered, she would like to seriously consider surgery. (Updated 02/07/2022)    Medical conditions:  Glaucoma: no  Seizures: no  Medullary thyroid cancer (personal or family hx): no  Multiple Endocrine Neoplasia: no  Palpitations/Tachycardia: no  Chest Pain: no past history of MI.   Headaches/Migraines: no  Nephrolithiasis: no  H/o pancreatitis: no  GERD: no     Prior Surgeries:  Cholecystectomy: no  Hysterectomy: no     Birth Control Methods: OCP         Relevant Medications    WEGOVY 1 MG/0.5 ML SUBCUTANEOUS PEN INJECTOR    Insulin resistance - Primary     Reviewed relevant medications and labs. Pt is following our [facility]. See Obesity tab for medication changes.                  Return in about 3 months (around 12/02/2022) for Weight clinic F/U one slot. every three mos.    I have reviewed and addressed  the patient???s adherence and response to prescribed medications. I have identified patient barriers to following the proposed medication and treatment plan, and have noted opportunities to optimize healthy behaviors. I have answered the patient???s questions to satisfaction and the patient voices understanding.    Time: Greater than 50% of this encounter was spent in direct consultation with the patient in evaluation and discussing all of the above. Duration of encounter: 30 minutes.     HPI:     Peggy Rojas is a 42 y.o. female who  has a past medical history of Essential hypertension (02/07/2022) and Obesity ([id number]). who presents today for [facility] follow up.    Weight Management History  Wt Readings from Last 6 Encounters:   09/03/22 96.5 kg (212 lb 12.8 oz)   07/02/22 (!) 101.9 kg (224 lb 9.6 oz)   05/02/22 (!) 107.5 kg (237 lb 1.3 oz)   02/07/22 (!) 116.6 kg (257 lb)         02/07/2022     1:00 PM 05/02/2022     2:41 PM 07/02/2022    10:28 AM 09/03/2022     9:54 AM   Waist Circumference   Waist Circumference 43 inches 41.5 inches 38.5 inches 38 inches         Update: 12 lb loss in 2 mos.   Medication: Pt taking Wegovy 1.0 mg and metformin 500 mg BID as directed. Pt has started getting Wegovy from [facility] pharmacy.   Eating Pattern: Pt continues to make healthy eating choices. Going out to eat once a week.   - Breakfast: Eggs, Oatmeal, and [place] yogurt  - Lunch: Salad, Leftovers, and Frozen Meal  - Dinner: Salad, Chicken, Fish, Frozen Meal, and Fast food/restaurant/takeout 1 times per week  - Drinks: Water and Coffee  - Snacks: Fruit, Granola Bars, and Crackers/cheese  Physical Activity:  - Type: Walk and Lift weights, Frequency: 45 mins 2-4 times per week   Barrier: nothing specific    Lab Results   Component Value Date    LDL 95 02/07/2022    HDL 56 02/07/2022    A1C 5.5 02/07/2022    GLU 87 02/07/2022    TSH 1.103 02/07/2022    AST 23 02/07/2022    ALT 24 02/07/2022     Past Medical/Surgical History:     Past Medical History:   Diagnosis Date    Essential hypertension 02/07/2022    Obesity [id number]     Past Surgical History:   Procedure Laterality Date    BREAST SURGERY  2016       Social History:     Social History     Socioeconomic History    Marital status: Married     Spouse name: None    Number of children: None    Years of education: None    Highest education level: None   Tobacco Use    Smoking status: Never    Smokeless tobacco: Never   Vaping Use    Vaping status: Never Used   Substance and Sexual Activity    Alcohol use: Not Currently    Drug use: Never    Sexual activity: Yes     Partners: Male     Birth control/protection: Diaphragm Family History:     History reviewed. No pertinent family history.    Allergies:     Patient has no known allergies.    Current Medications:     Current Outpatient Medications   Medication  Sig Dispense Refill    adalimumab-bwwd (HADLIMA,CF, PUSHTOUCH) 40 mg/0.4 mL AtIn Inject the contents of 1 pen (40 mg total) under the skin every fourteen (14) days. 2.4 mL 5    chlorthalidone (HYGROTON) 25 MG tablet Take 1 tablet (25 mg total) by mouth every morning. 90 tablet 3    empty container Misc Use as directed to dispose of injectable medications 1 each 3    ibuprofen (MOTRIN) 600 MG tablet Take by mouth every hour as needed.      LARIN 24 FE 1 mg-20 mcg (24)/75 mg (4) Tab Take 1 tablet by mouth daily.      metFORMIN (GLUCOPHAGE-XR) 500 MG 24 hr tablet Take 1 tablet (500 mg total) by mouth Two (2) times a day. 120 tablet 3    WEGOVY 1 MG/0.5 ML SUBCUTANEOUS PEN INJECTOR Inject 1 mg under the skin every seven (7) days. 4 each 2     No current facility-administered medications for this visit.       I have reviewed and (if needed) updated the patient's problem list, medications, allergies, past medical and surgical history, social and family history.    ROS:     Unless otherwise stated in the HPI:  CONSTITUTIONAL: no fever chills.   HEENT: Eyes: No diplopia or blurred vision. ENT: No earache, sore throat or runny nose.   CARDIOVASCULAR: No pressure, squeezing, strangling, tightness, heaviness or aching about the chest, neck, axilla or epigastrium.   RESPIRATORY: No cough, shortness of breath, PND or orthopnea.   GASTROINTESTINAL: No nausea, vomiting or diarrhea.   GENITOURINARY: No dysuria, frequency or urgency.   MUSCULOSKELETAL: no new pains, no joint swelling or redness.   SKIN: No change in skin, hair or nails.   NEUROLOGIC: No paresthesias, fasciculations, seizures or weakness.   PSYCHIATRIC: No disorder of thought or mood.   ENDOCRINE: No heat or cold intolerance, polyuria or polydipsia.   HEMATOLOGICAL: No easy bruising or bleeding.    Vital Signs:     Body mass index is 33.79 kg/m??. Waist Circumference: 38 inches    Wt Readings from Last 3 Encounters:   09/03/22 96.5 kg (212 lb 12.8 oz)   07/02/22 (!) 101.9 kg (224 lb 9.6 oz)   05/02/22 (!) 107.5 kg (237 lb 1.3 oz)     Temp Readings from Last 3 Encounters:   09/03/22 36.3 ??C (97.3 ??F)   07/02/22 36.4 ??C (97.6 ??F) (Temporal)   05/02/22 36.6 ??C (97.9 ??F)     BP Readings from Last 3 Encounters:   09/03/22 144/84   07/02/22 146/89   05/02/22 155/89     Pulse Readings from Last 3 Encounters:   09/03/22 76   07/02/22 73   05/02/22 81         02/07/2022     1:00 PM 05/02/2022     2:41 PM 07/02/2022    10:28 AM 09/03/2022     9:54 AM   Waist Circumference   Waist Circumference 43 inches 41.5 inches 38.5 inches 38 inches          Physical Exam:     General: well appearing, in NAD, Body mass index is 33.79 kg/m??. Ambulatory without help.  Body fat distribution: General adiposity. No supraclavicular adiposity. No dorsal adiposity. Waist Circumference: 38 inches     Head: normocephalic atraumatic.  Eyes: PERRLA, EOMI, Sclera WNL.   Oral pharynx: moist, no exudate, no erythema, not enlarged. Good dental hygiene. Moderate OP crowding.  Neck: supple, no LAD, no  thyromegaly, no bruit.  CV: RRR no rubs or murmurs. Peripheral edema: none.  Lungs: clear bilaterally to auscultation. No wheezing.  Abdomen: soft, NT/ND. No intertrigo. Moderate pannus. No hepatomegaly.   Extremities: no clubbing, cyanosis, No edema.  Skin: no concerning lesions, rashes observed. No lipomas, moderate acanthosis nigricans.  Neuro: Alert and oriented X 3.    Labs:     No visits with results within 1 Month(s) from this visit.   Latest known visit with results is:   Office Visit on 02/07/2022   Component Date Value Ref Range Status    TSH 02/07/2022 1.103  0.550 - 4.780 uIU/mL Final    Triglycerides 02/07/2022 80  0 - 150 mg/dL Final    Cholesterol 09/81/1914 167  <=200 mg/dL Final    HDL 78/29/5621 56  40 - 60 mg/dL Final    LDL Calculated 02/07/2022 95  40 - 99 mg/dL Final    NHLBI Recommended Ranges, LDL Cholesterol, for Adults (20+yrs) (ATPIII), mg/dL  Optimal              <308  Near Optimal        100-129  Borderline High     130-159  High                160-189  Very High            >=190  NHLBI Recommended Ranges, LDL Cholesterol, for Children (2-19 yrs), mg/dL  Desirable            <657  Borderline High     110-129  High                 >=130      VLDL Cholesterol Cal 02/07/2022 16  9 - 37 mg/dL Final    Chol/HDL Ratio 02/07/2022 3.0  1.0 - 4.5 Final    Non-HDL Cholesterol 02/07/2022 111  70 - 130 mg/dL Final    Non-HDL Cholesterol Recommended Ranges (mg/dL)  Optimal       <846  Near Optimal 130 - 159  Borderline High 160 - 189  High             190 - 219  Very High       >220      FASTING 02/07/2022 No   Final    Hemoglobin A1C 02/07/2022 5.5  4.8 - 5.6 % Final    Estimated Average Glucose 02/07/2022 111  mg/dL Final    Sodium 96/29/5284 141  135 - 145 mmol/L Final    Potassium 02/07/2022 3.9  3.4 - 4.8 mmol/L Final    Chloride 02/07/2022 109 (H)  98 - 107 mmol/L Final    CO2 02/07/2022 25.4  20.0 - 31.0 mmol/L Final    Anion Gap 02/07/2022 7  5 - 14 mmol/L Final    BUN 02/07/2022 11  9 - 23 mg/dL Final    Creatinine 13/24/4010 0.75  0.60 - 0.80 mg/dL Final    BUN/Creatinine Ratio 02/07/2022 15   Final    eGFR CKD-EPI (2021) Female 02/07/2022 >90  >=60 mL/min/1.52m2 Final    eGFR calculated with CKD-EPI 2021 equation in accordance with [employer] and [employer] of Nephrology Task Force recommendations.    Glucose 02/07/2022 87  70 - 179 mg/dL Final    Calcium 27/25/3664 9.7  8.7 - 10.4 mg/dL Final    Albumin 40/34/7425 3.8  3.4 - 5.0 g/dL Final    Total Protein 02/07/2022 7.6  5.7 -  8.2 g/dL Final    Total Bilirubin 02/07/2022 0.2 (L)  0.3 - 1.2 mg/dL Final    AST 16/04/9603 23  <=34 U/L Final    ALT 02/07/2022 24  10 - 49 U/L Final    Alkaline Phosphatase 02/07/2022 59  46 - 116 U/L Final Follow-up:     Return in about 3 months (around 12/02/2022) for Weight clinic F/U one slot. every three mos.    I attest that I, Constance Goltz, personally documented this note while acting as scribe for Marshell Garfinkel, MD.      Constance Goltz, Scribe.  09/03/2022     The documentation recorded by the scribe accurately reflects the service I personally performed and the decisions made by me.    Marshell Garfinkel, MD

## 2022-09-03 NOTE — Unmapped (Signed)
Reviewed relevant medications and labs. Pt is following our Weight Management Clinic. See Obesity tab for medication changes.

## 2022-09-11 NOTE — Unmapped (Signed)
[facility] Refill Coordination Note    Peggy Rojas, DOB: July 22, 1980  Phone: There are no phone numbers on file.      All above HIPAA information was verified with patient.         09/08/2022     1:09 PM   Specialty Rx Medication Refill Questionnaire   Which Medications would you like refilled and shipped? [place]   Please list all current allergies: None   Have you missed any doses in the last 30 days? No   Have you had any changes to your medication(s) since your last refill? No   How many days remaining of each medication do you have at home? 2   If receiving an injectable medication, next injection date is 09/12/2022   Have you experienced any side effects in the last 30 days? No   Please enter the full address (street address, city, state, zip code) where you would like your medication(s) to be delivered to. [address] [phone]   Please specify on which day you would like your medication(s) to arrive. Note: if you need your medication(s) within 3 days, please call the pharmacy to schedule your order at [phone]  09/12/2022   Has your insurance changed since your last refill? No   Would you like a pharmacist to call you to discuss your medication(s)? No   Do you require a signature for your package? (Note: if we are billing Medicare Part B or your order contains a controlled substance, we will require a signature) No         Completed refill call assessment today to schedule patient's medication shipment from the [facility] [phone]).  All relevant notes have been reviewed.       Confirmed patient received a Conservation officer, historic buildings and a Surveyor, mining with first shipment. The patient will receive a drug information handout for each medication shipped and additional FDA Medication Guides as required.         REFERRAL TO PHARMACIST     Referral to the pharmacist: Not needed      [facility]     Shipping address confirmed in Epic. Delivery Scheduled: Yes, Expected medication delivery date: 09/25/22  confirmed with pt via telephone.     Medication will be delivered via Same Day Courier to the prescription address in Epic WAM.    Unk Lightning   [facility] Specialty Technician

## 2022-09-25 DIAGNOSIS — Z6841 Body Mass Index (BMI) 40.0 and over, adult: Principal | ICD-10-CM

## 2022-09-25 MED FILL — HADLIMA(CF) PUSHTOUCH 40 MG/0.4 ML SUBCUTANEOUS AUTO-INJECTOR: SUBCUTANEOUS | 28 days supply | Qty: 0.8 | Fill #4

## 2022-09-30 NOTE — Progress Notes (Unsigned)
PCP:  [name], [name]   No chief complaint on file.    HPI:      [name] is a 42 y.o. G1P0 who LMP was No LMP recorded., presents today for her annual examination.  Her menses are regular every 28-30 days, lasting 3-4 days with OCPs, light flow. Dysmenorrhea mild, occurring first 1-2 days of flow, relieved with NSAIDs. She does not have intermenstrual bleeding. Hx of leio.   Sex activity: single partner, contraception-OCPs. No pain/bleeding. Last Pap: 09/10/21 Results were ASCUS/neg HPV DNA. Repeat pap due today 09/07/20 Results were LGSIL, neg HPV DNA.  07/28/19 was neg cells/neg HPV DNA. S/p  1/20 Neg cells with POS HPV DNA with colpo and neg bx 08/13/18 with Dr. Georgianne Fick. Hx of ap with  2018  pap no abnormalities /POS HPV DNA with neg Colpo and bx  Hx of STDs: HPV  Last mammogram: 10/17/21 Results were normal, repeat in 12 months; 3/22 cat 3; 09/17/19 Cat 4 RT breast, 3/21 bx showed [place]; Hx of bilat fibroadenomas removed with Dr. Jamal Collin 8/16.  There is a FH of breast cancer in her mat great great aunt, genetic testing not indicated. There is no FH of ovarian cancer. There is a FH pancreatic cancer in her MGF and pat cousin. The patient does do self-breast exams.   Tobacco use: The patient denies current or previous tobacco use. Alcohol use: social drinker No drug use.  Exercise: moderately active  She does get adequate calcium and Vitamin D in her diet.  Labs with PCP  Past Medical History:  Diagnosis Date   Fibroadenoma of both breasts    GERD (gastroesophageal reflux disease)    Pre-diabetes    Sarcoidosis    2009   Vitamin D deficiency     Past Surgical History:  Procedure Laterality Date   BREAST BIOPSY Right neg   2002 Dr. Larinda Buttery  excisional   BREAST BIOPSY Bilateral 03/06/2015   Procedure: BILATERAL BREAST MASS EXCISION, right breast mass excision with ultrasound guided needle localization ;  Surgeon: Christene Lye, MD;  Location: [facility] ORS;   Service: General;  Laterality: Bilateral;   BREAST BIOPSY Right 2021   coil clip, [place] BX,  PASH stromal fibrosis   BREAST EXCISIONAL BIOPSY     COLONOSCOPY  2011   [place] AND CURETTAGE OF UTERUS  2007   DILATION AND CURETTAGE OF UTERUS N/A 07/05/2017   Procedure: DILATATION AND CURETTAGE;  Surgeon: Homero Fellers, MD;  Location: [facility] ORS;  Service: Gynecology;  Laterality: N/A;   mass removed  2002   TONSILLECTOMY     UPPER GI ENDOSCOPY  2007   WISDOM TOOTH EXTRACTION  2006    Family History  Problem Relation Age of Onset   Heart disease Maternal Grandmother    Hypertension Maternal Grandmother    Pancreatic cancer Maternal Grandfather 17   Pancreatic cancer Cousin        7s   Breast cancer Other 64    Social History   Socioeconomic History   Marital status: Married    Spouse name: Not on file   Number of children: Not on file   Years of education: Not on file   Highest education level: Not on file  Occupational History   Not on file  Tobacco Use   Smoking status: Never   Smokeless tobacco: Never  Vaping Use   Vaping Use: Never used  Substance and Sexual Activity   Alcohol  use: Yes    Alcohol/week: 2.0 standard drinks of alcohol    Types: 2 Glasses of wine per week    Comment: Occasionally   Drug use: No   Sexual activity: Yes    Birth control/protection: Pill  Other Topics Concern   Not on file  Social History Narrative   Not on file   Social Determinants of Health   Financial Resource Strain: Not on file  Food Insecurity: Not on file  Transportation Needs: Not on file  Physical Activity: Not on file  Stress: Not on file  Social Connections: Not on file  Intimate Partner Violence: Not on file    No outpatient medications have been marked as taking for the 10/01/22 encounter (Appointment) with Harrell Niehoff, Deirdre Evener, [name].     ROS:  Review of Systems  Constitutional:  Negative for fatigue, fever and unexpected weight change.   Respiratory:  Negative for cough, shortness of breath and wheezing.   Cardiovascular:  Negative for chest pain, palpitations and leg swelling.  Gastrointestinal:  Negative for blood in stool, constipation, diarrhea, nausea and vomiting.  Endocrine: Negative for cold intolerance, heat intolerance and polyuria.  Genitourinary:  Negative for dyspareunia, dysuria, flank pain, frequency, genital sores, hematuria, menstrual problem, pelvic pain, urgency, vaginal bleeding, vaginal discharge and vaginal pain.  Musculoskeletal:  Negative for back pain, joint swelling and myalgias.  Skin:  Negative for rash.  Neurological:  Negative for dizziness, syncope, light-headedness, numbness and headaches.  Hematological:  Negative for adenopathy.  Psychiatric/Behavioral:  Negative for agitation, confusion, sleep disturbance and suicidal ideas. The patient is not nervous/anxious.      Objective: There were no vitals taken for this visit.   Physical Exam Constitutional:      Appearance: She is well-developed.  Genitourinary:     Vulva normal.     Right Labia: No rash, tenderness or lesions.    Left Labia: No tenderness, lesions or rash.    No vaginal discharge, erythema or tenderness.      Right Adnexa: not tender and no mass present.    Left Adnexa: not tender and no mass present.    No cervical motion tenderness, friability or polyp.     Uterus is not enlarged or tender.  Breasts:    Right: No mass, nipple discharge, skin change or tenderness.     Left: No mass, nipple discharge, skin change or tenderness.  Neck:     Thyroid: No thyromegaly.  Cardiovascular:     Rate and Rhythm: Normal rate and regular rhythm.     Heart sounds: Normal heart sounds. No murmur heard. Pulmonary:     Effort: Pulmonary effort is normal.     Breath sounds: Normal breath sounds.  Abdominal:     Palpations: Abdomen is soft.     Tenderness: There is no abdominal tenderness. There is no guarding or rebound.   Musculoskeletal:        General: Normal range of motion.     Cervical back: Normal range of motion.  Lymphadenopathy:     Cervical: No cervical adenopathy.  Neurological:     General: No focal deficit present.     Mental Status: She is alert and oriented to person, place, and time.     Cranial Nerves: No cranial nerve deficit.  Skin:    General: Skin is warm and dry.  Psychiatric:        Mood and Affect: Mood normal.        Behavior: Behavior normal.  Thought Content: Thought content normal.        Judgment: Judgment normal.  Vitals reviewed.     Assessment/Plan: Encounter for annual routine gynecological examination  Cervical cancer screening - Plan: Cytology - PAP  Screening for HPV (human papillomavirus) - Plan: Cytology - PAP  LGSIL on Pap smear of cervix - Plan: Cytology - PAP; repeat pap today, will f/u with results  Encounter for surveillance of contraceptive pills - Plan: Norethindrone Acetate-Ethinyl Estrad-FE (LARIN 24 FE) 1-20 MG-MCG(24) tablet; OCP RF  Encounter for screening mammogram for malignant neoplasm of breast - Plan: US BREAST LTD UNI RIGHT INC AXILLA, MM DIAG BREAST TOMO BILATERAL; pt to schedule mammo      No orders of the defined types were placed in this encounter.         GYN counsel breast self exam, adequate intake of calcium and vitamin D, diet and exercise     F/U  No follow-ups on file.  Jaimey Franchini B. Cortlyn Cannell, [name] 09/30/2022 5:16 PM

## 2022-10-01 ENCOUNTER — Other Ambulatory Visit (HOSPITAL_COMMUNITY)
Admission: RE | Admit: 2022-10-01 | Discharge: 2022-10-01 | Disposition: A | Discharge status: Home / Self Care | Payer: BC Managed Care – PPO | Source: Ambulatory Visit | Attending: Obstetrics and Gynecology | Admitting: Obstetrics and Gynecology

## 2022-10-01 ENCOUNTER — Encounter: Payer: Self-pay | Admitting: Obstetrics and Gynecology

## 2022-10-01 ENCOUNTER — Ambulatory Visit (INDEPENDENT_AMBULATORY_CARE_PROVIDER_SITE_OTHER): Payer: BC Managed Care – PPO | Admitting: Obstetrics and Gynecology

## 2022-10-01 VITALS — BP 106/70 | Ht 68.0 in | Wt 208.0 lb

## 2022-10-01 DIAGNOSIS — Z8742 Personal history of other diseases of the female genital tract: Secondary | ICD-10-CM | POA: Insufficient documentation

## 2022-10-01 DIAGNOSIS — Z124 Encounter for screening for malignant neoplasm of cervix: Secondary | ICD-10-CM | POA: Diagnosis not present

## 2022-10-01 DIAGNOSIS — Z1231 Encounter for screening mammogram for malignant neoplasm of breast: Secondary | ICD-10-CM

## 2022-10-01 DIAGNOSIS — Z3041 Encounter for surveillance of contraceptive pills: Secondary | ICD-10-CM

## 2022-10-01 DIAGNOSIS — Z01419 Encounter for gynecological examination (general) (routine) without abnormal findings: Secondary | ICD-10-CM

## 2022-10-01 DIAGNOSIS — Z1151 Encounter for screening for human papillomavirus (HPV): Secondary | ICD-10-CM

## 2022-10-01 DIAGNOSIS — A630 Anogenital (venereal) warts: Secondary | ICD-10-CM

## 2022-10-01 MED ORDER — LARIN 24 FE 1-20 MG-MCG(24) PO TABS: 1.0000 | ORAL_TABLET | Freq: Every day | ORAL | 3 refills | Status: DC

## 2022-10-01 MED ORDER — HADLIMA(CF) PUSHTOUCH 40 MG/0.4 ML SUBCUTANEOUS AUTO-INJECTOR
5 refills | 0 days
Start: 2022-10-01 — End: ?

## 2022-10-01 NOTE — Patient Instructions (Addendum)
I value your feedback and you entrusting us with your care. If you get a [place] patient survey, I would appreciate you taking the time to let us know about your experience today. Thank you!  [facility] [place] [facility]: [phone]      

## 2022-10-04 LAB — CYTOLOGY - PAP
Comment: NEGATIVE
Diagnosis: NEGATIVE
High risk HPV: NEGATIVE

## 2022-10-16 IMAGING — MG DIGITAL DIAGNOSTIC BILAT W/ TOMO W/ CAD
8 of 15 series · 8 of 40 positions shown · non-contrast
Comparison: Previous exam(s).

CLINICAL DATA: 2 year interval follow-up of a likely benign mass in
the UPPER INNER QUADRANT of the RIGHT breast at the 2 o'clock
position 9 cm from the nipple. Personal history of excisional
biopsies from both breasts for benign fibroadenomas. Personal
history of ultrasound-guided core needle biopsy of a mass in the
LOWER INNER QUADRANT of the RIGHT breast in September 2019, pathology
fibroadenoma. Annual evaluation, LEFT breast.

EXAM:
DIGITAL DIAGNOSTIC BILATERAL MAMMOGRAM WITH TOMOSYNTHESIS AND CAD;
ULTRASOUND RIGHT BREAST LIMITED
TECHNIQUE: Bilateral digital diagnostic mammography and breast tomosynthesis
was performed. The images were evaluated with computer-aided
detection.; Targeted ultrasound examination of the right breast was
performed

[L MLO synth-2D (1 of 2)]
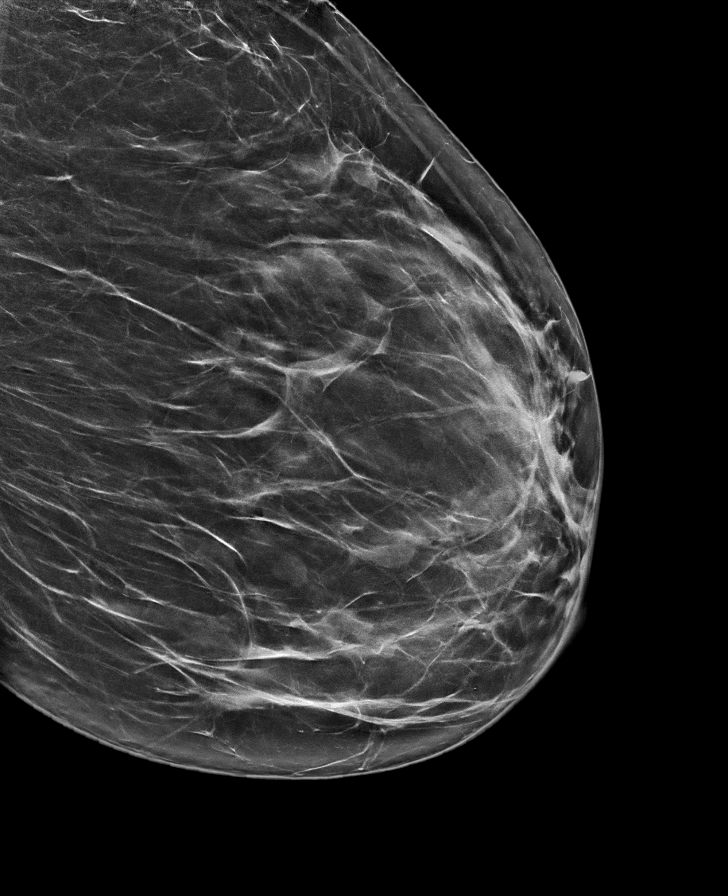

[L MLO synth-2D (2 of 2)]
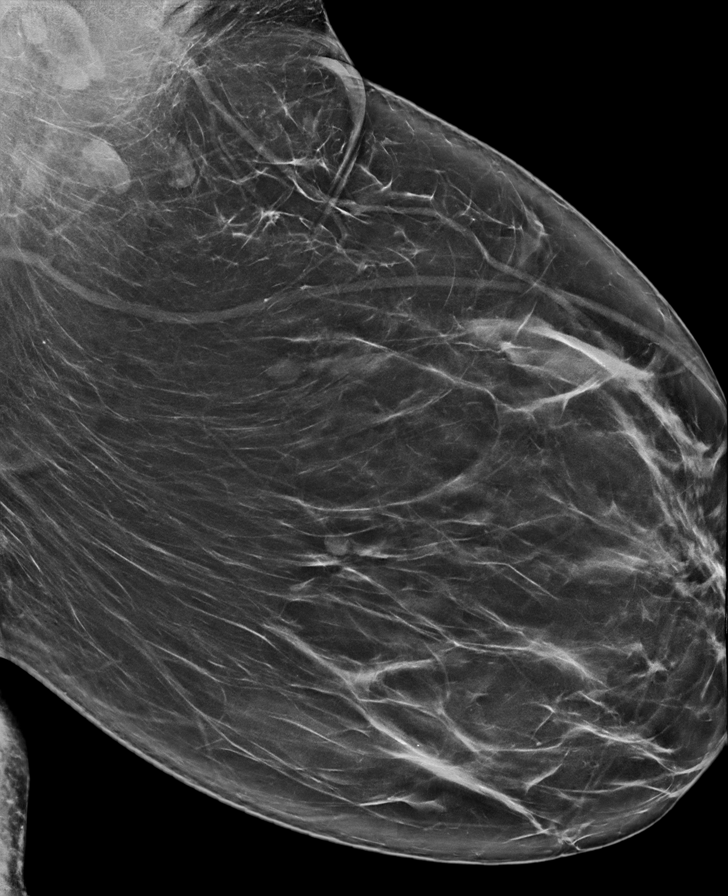

[R MLO synth-2D (1 of 2)]
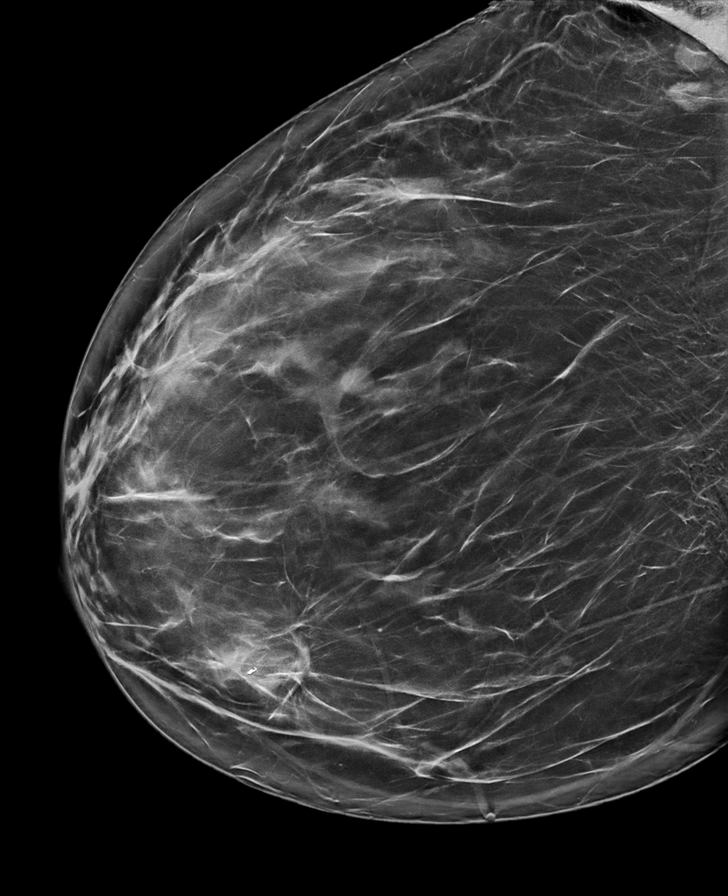

[R CC synth-2D (1 of 2)]
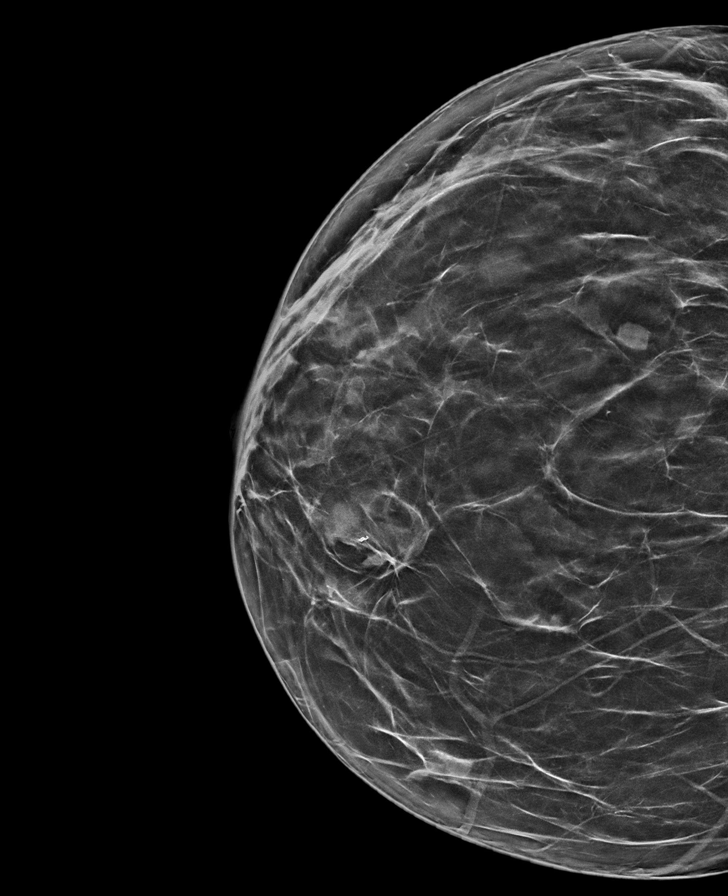

[L CC synth-2D]
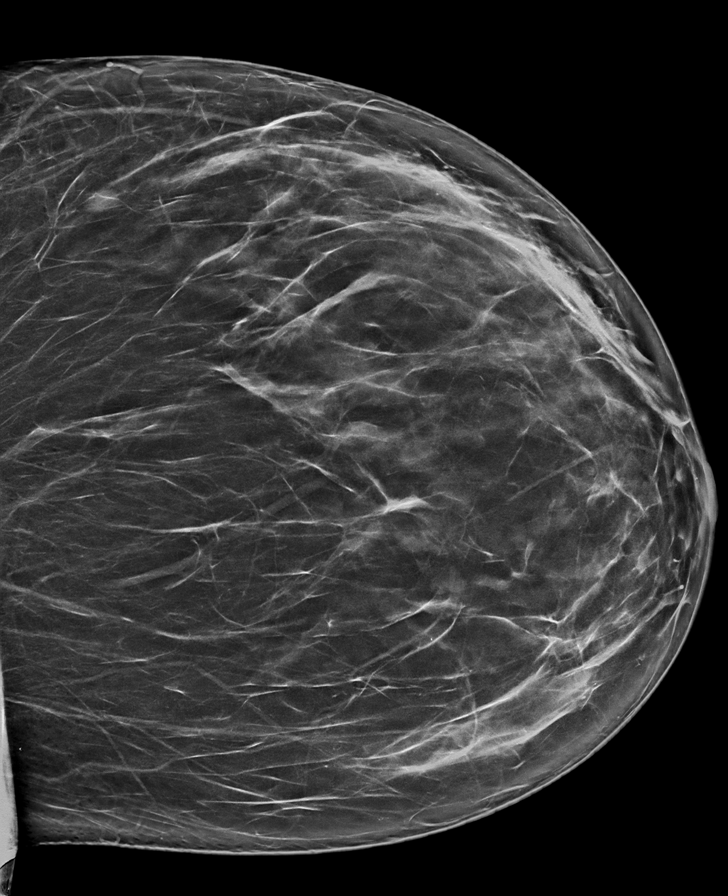

[R MLO synth-2D (2 of 2)]
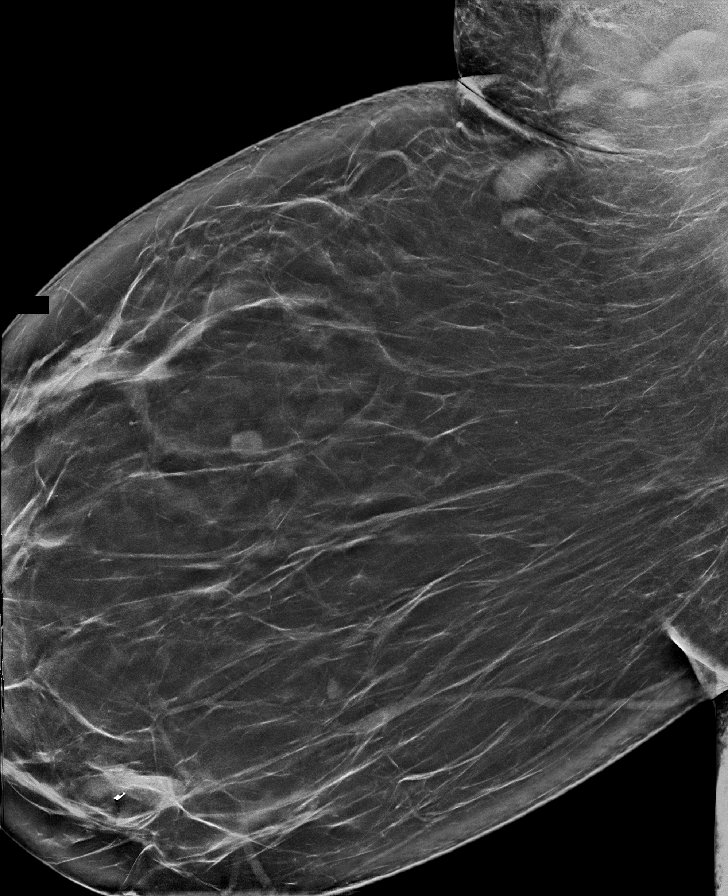

[R CC synth-2D (2 of 2)]
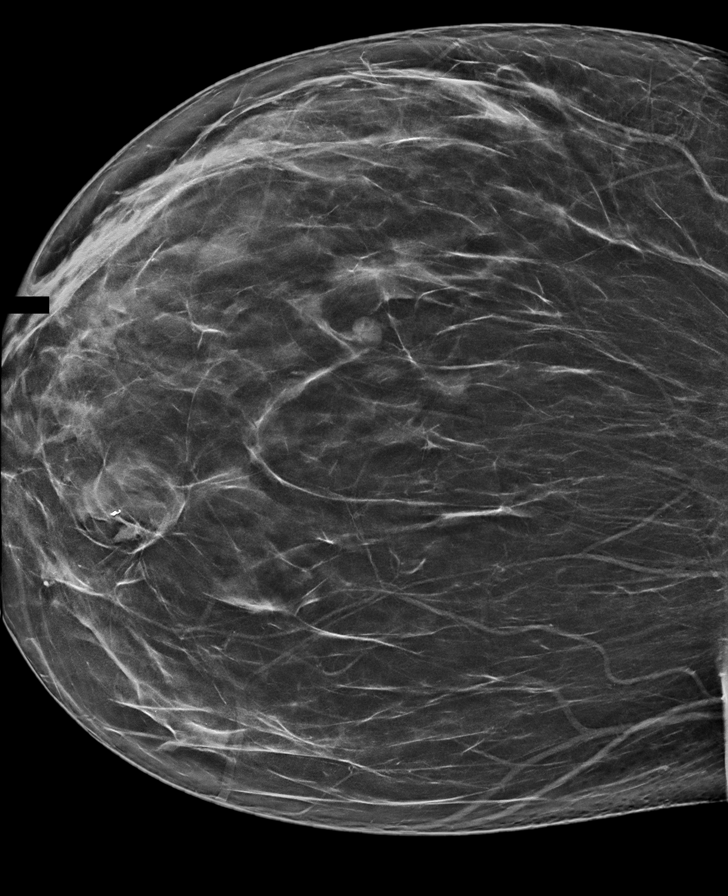

[R MLO tomo · tomo slice 64/93.0]
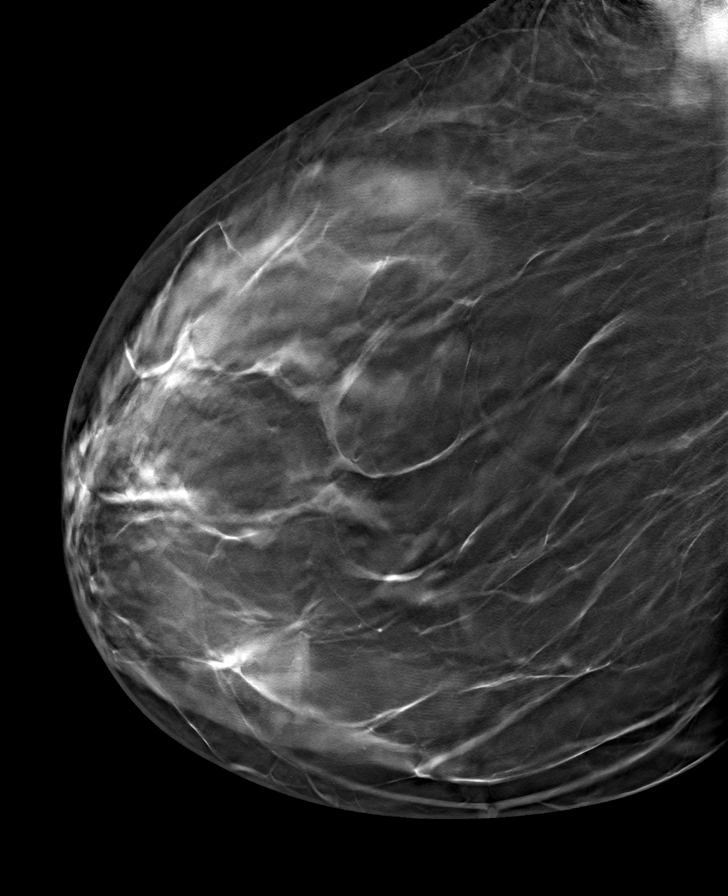

[8 of 40 positions shown; findings below may reference images not displayed]

ACR Breast Density Category b: There are scattered areas of
fibroglandular density.
FINDINGS: Full field CC and MLO views of both breasts were obtained.

RIGHT: Circumscribed isodense mass in the UPPER INNER QUADRANT at
middle depth measuring approximately 0.9 cm, unchanged dating back
to the September 2019 mammogram. Similar circumscribed isodense masses
elsewhere, also unchanged, including the biopsy-proven fibroadenoma
in the LOWER INNER QUADRANT associated with a coil shaped tissue
marking clip. No new or suspicious findings.

Targeted ultrasound is performed, again demonstrating the
circumscribed oval parallel hypoechoic mass at the 2 o'clock
position 9 cm from nipple measuring approximately 0.9 x 0.4 x 0.7 cm
(previously 0.9 x 0.4 x 0.6 cm on 10/11/2020, 0.9 x 0.4 x 0.7 cm on
05/17/2020, and 0.9 x 0.5 x 0.8 cm on 09/17/2019).

LEFT: No findings suspicious for malignancy. Scattered circumscribed
isodense masses which are stable over multiple prior mammograms,
confirming benignity.
IMPRESSION: 1. No mammographic or sonographic evidence of malignancy involving
the RIGHT breast.
2. No mammographic evidence of malignancy involving the LEFT breast.
3. Stable 0.9 cm mass in the UPPER INNER QUADRANT of the RIGHT
breast at 2 o'clock 9 cm from nipple dating back to September 2019,
confirming benignity, likely a fibroadenoma.

RECOMMENDATION:
Screening mammogram in one year.(Code:Y2-M-4QN)

I have discussed the findings and recommendations with the patient.
If applicable, a reminder letter will be sent to the patient
regarding the next appointment.

BI-RADS CATEGORY  2: Benign.

## 2022-10-16 IMAGING — US US BREAST*R* LIMITED INC AXILLA
1 series · 6 of 6 positions shown · non-contrast
Comparison: Previous exam(s).

CLINICAL DATA: 2 year interval follow-up of a likely benign mass in
the UPPER INNER QUADRANT of the RIGHT breast at the 2 o'clock
position 9 cm from the nipple. Personal history of excisional
biopsies from both breasts for benign fibroadenomas. Personal
history of ultrasound-guided core needle biopsy of a mass in the
LOWER INNER QUADRANT of the RIGHT breast in September 2019, pathology
fibroadenoma. Annual evaluation, LEFT breast.

EXAM:
DIGITAL DIAGNOSTIC BILATERAL MAMMOGRAM WITH TOMOSYNTHESIS AND CAD;
ULTRASOUND RIGHT BREAST LIMITED
TECHNIQUE: Bilateral digital diagnostic mammography and breast tomosynthesis
was performed. The images were evaluated with computer-aided
detection.; Targeted ultrasound examination of the right breast was
performed

[Series 4: us breast*right* limited inc axilla · 0.05mm/px · 6 of 6 slices shown]
[im 1/6]
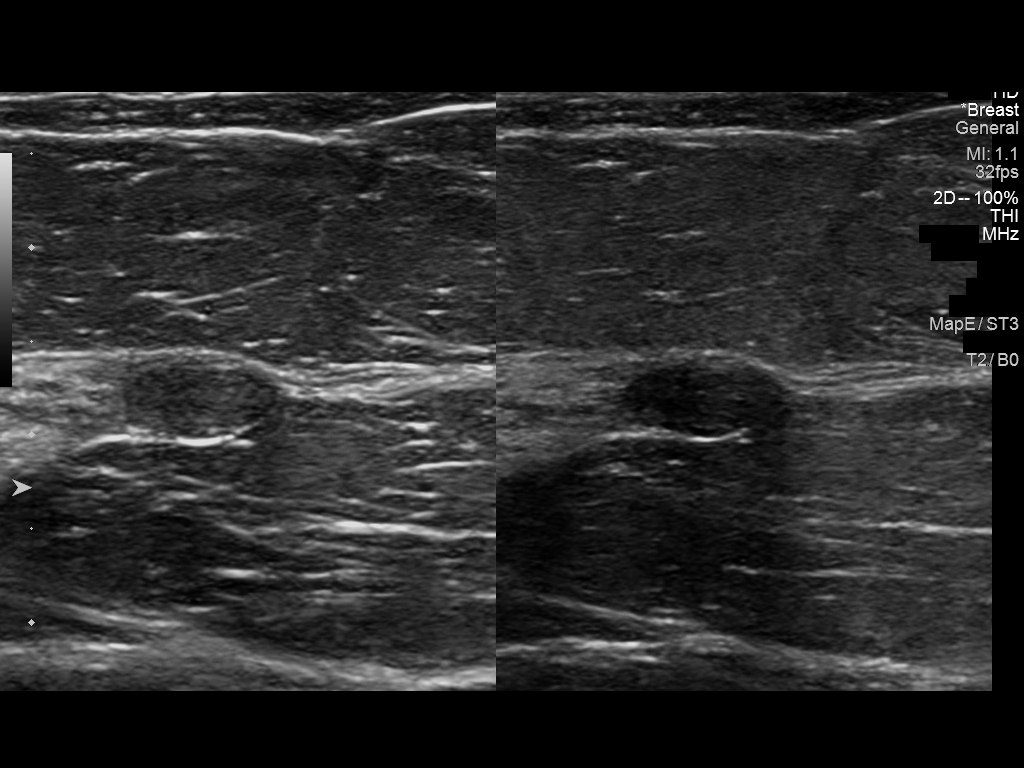
[im 2/6]
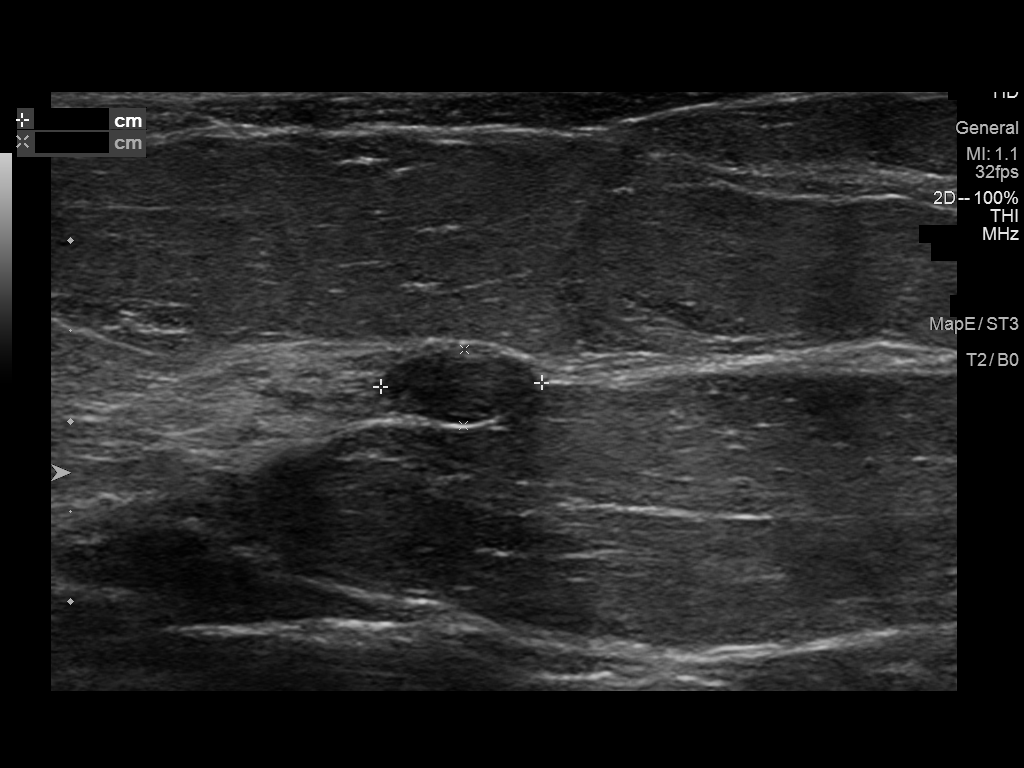
[im 3/6]
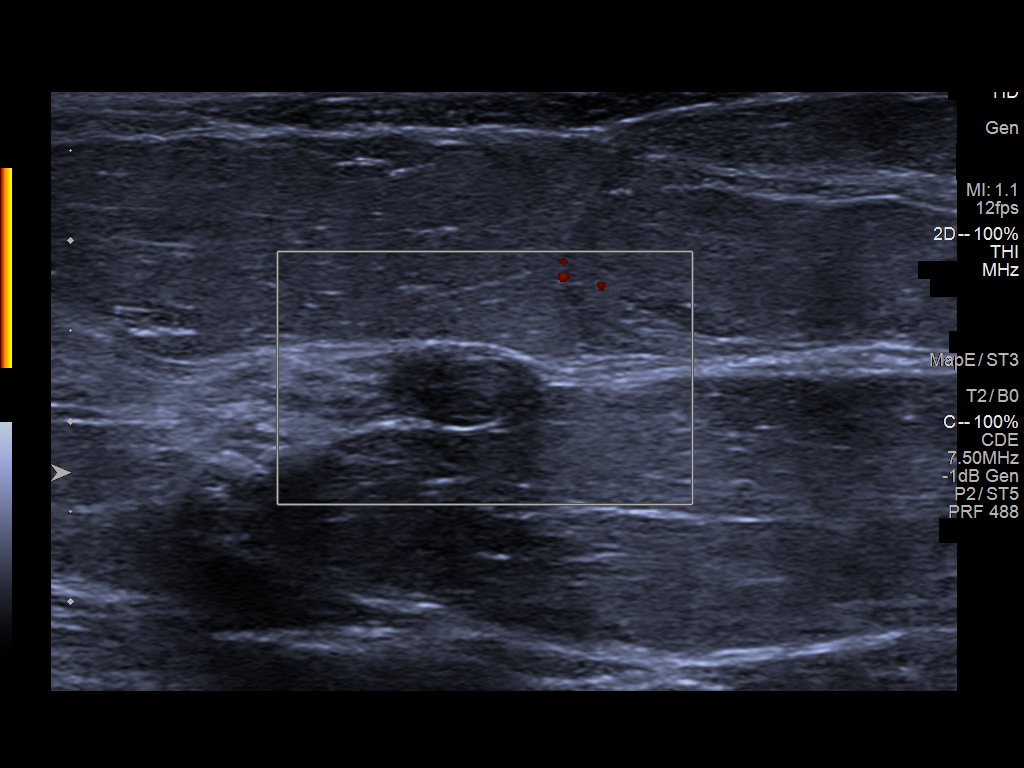
[im 4/6]
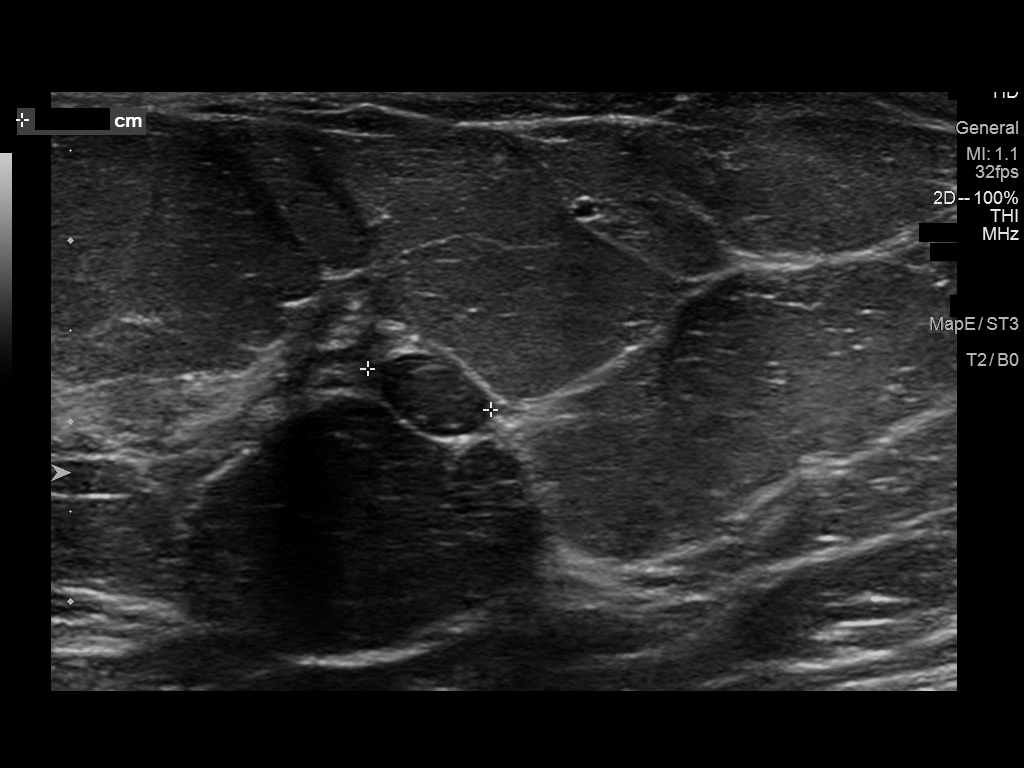
[im 5/6]
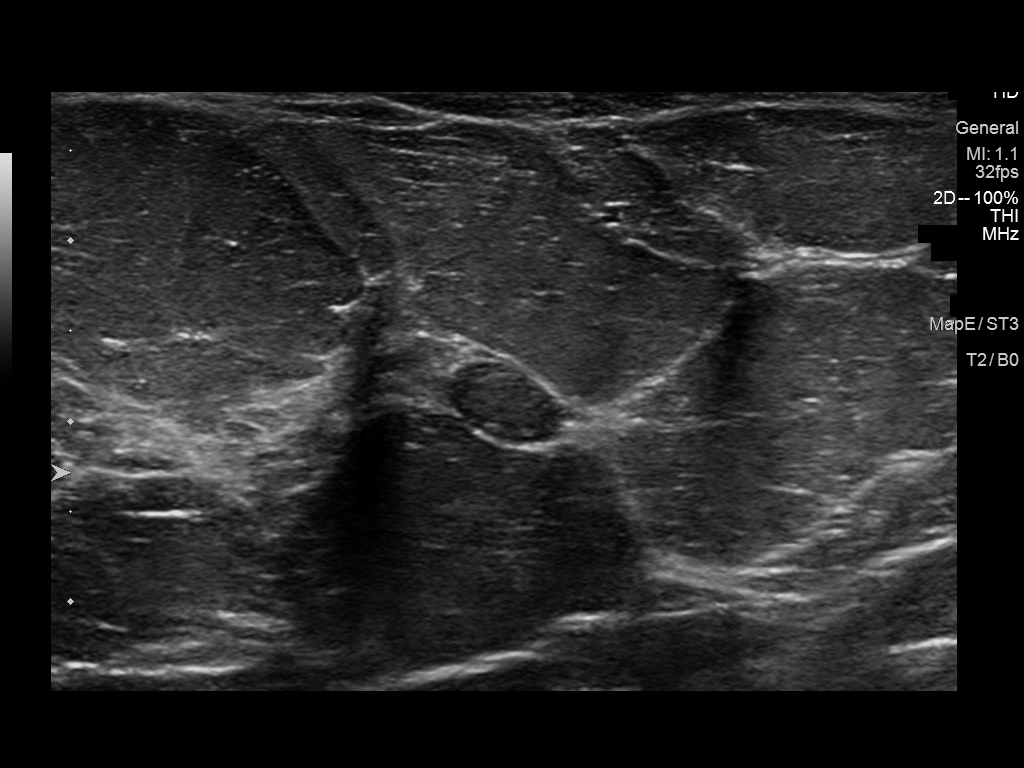
[im 6/6]
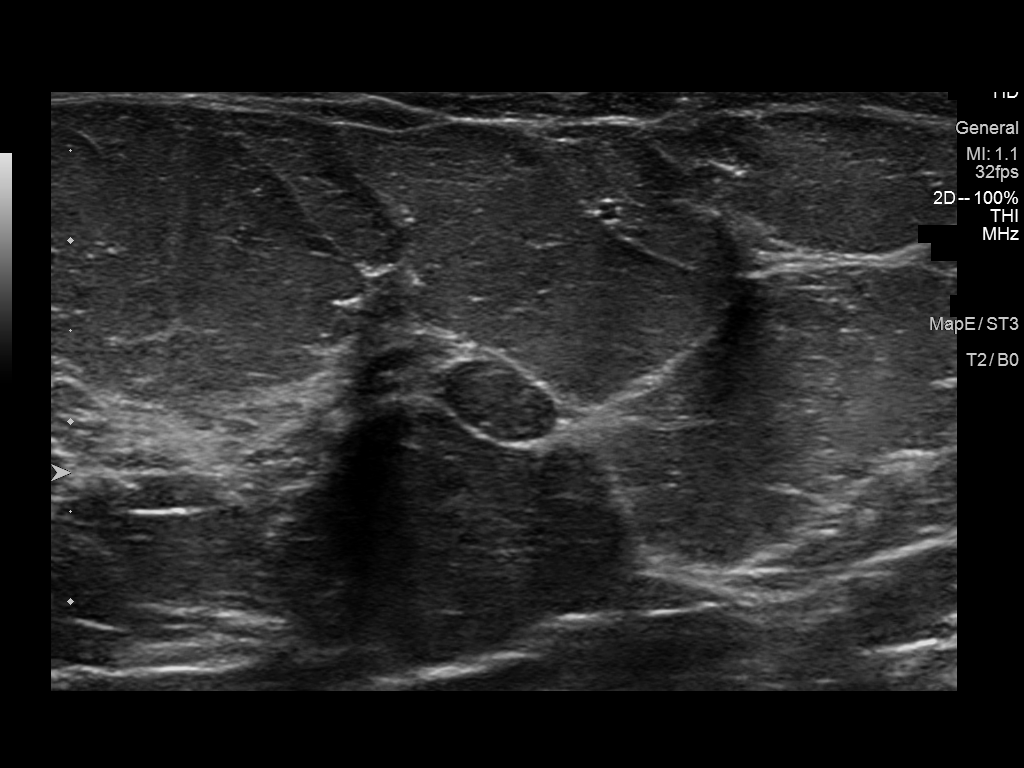

[6 of 6 positions shown; findings below may reference images not displayed]

ACR Breast Density Category b: There are scattered areas of
fibroglandular density.
FINDINGS: Full field CC and MLO views of both breasts were obtained.

RIGHT: Circumscribed isodense mass in the UPPER INNER QUADRANT at
middle depth measuring approximately 0.9 cm, unchanged dating back
to the September 2019 mammogram. Similar circumscribed isodense masses
elsewhere, also unchanged, including the biopsy-proven fibroadenoma
in the LOWER INNER QUADRANT associated with a coil shaped tissue
marking clip. No new or suspicious findings.

Targeted ultrasound is performed, again demonstrating the
circumscribed oval parallel hypoechoic mass at the 2 o'clock
position 9 cm from nipple measuring approximately 0.9 x 0.4 x 0.7 cm
(previously 0.9 x 0.4 x 0.6 cm on 10/11/2020, 0.9 x 0.4 x 0.7 cm on
05/17/2020, and 0.9 x 0.5 x 0.8 cm on 09/17/2019).

LEFT: No findings suspicious for malignancy. Scattered circumscribed
isodense masses which are stable over multiple prior mammograms,
confirming benignity.
IMPRESSION: 1. No mammographic or sonographic evidence of malignancy involving
the RIGHT breast.
2. No mammographic evidence of malignancy involving the LEFT breast.
3. Stable 0.9 cm mass in the UPPER INNER QUADRANT of the RIGHT
breast at 2 o'clock 9 cm from nipple dating back to September 2019,
confirming benignity, likely a fibroadenoma.

RECOMMENDATION:
Screening mammogram in one year.(Code:Y2-M-4QN)

I have discussed the findings and recommendations with the patient.
If applicable, a reminder letter will be sent to the patient
regarding the next appointment.

BI-RADS CATEGORY  2: Benign.

## 2022-10-18 NOTE — Unmapped (Signed)
[facility] Refill Coordination Note    Specialty Medication(s) to be Shipped:   Inflammatory Disorders: Hadlima    Other medication(s) to be shipped:  Wegovy     Peggy Rojas, DOB: 04/16/1981  Phone: There are no phone numbers on file.      All above HIPAA information was verified with patient.     Was a Nurse, learning disability used for this call? No    Completed refill call assessment today to schedule patient's medication shipment from the [facility] [phone]).  All relevant notes have been reviewed.     Specialty medication(s) and dose(s) confirmed: Regimen is correct and unchanged.   Changes to medications: Jayleene reports no changes at this time.  Changes to insurance: No  New side effects reported not previously addressed with a pharmacist or physician: None reported  Questions for the pharmacist: No    Confirmed patient received a Conservation officer, historic buildings and a Surveyor, mining with first shipment. The patient will receive a drug information handout for each medication shipped and additional FDA Medication Guides as required.       DISEASE/MEDICATION-SPECIFIC INFORMATION        For patients on injectable medications: Patient currently has 1 doses left.  Next injection is scheduled for 4/11.    SPECIALTY MEDICATION ADHERENCE     Medication Adherence    Patient reported X missed doses in the last month: 0  Specialty Medication: HADLIMA(CF) PUSHTOUCH 40 mg/0.4 mL Atin (adalimumab-bwwd)  Patient is on additional specialty medications: No              Were doses missed due to medication being on hold? No    Hadlima 40/0.4 mg/ml: 6 days of medicine on hand        REFERRAL TO PHARMACIST     Referral to the pharmacist: Not needed      [facility]     Shipping address confirmed in Epic.     Patient was notified of new phone menu : No    Delivery Scheduled: Yes, Expected medication delivery date: 10/23/22.     Medication will be delivered via Same Day Courier to the prescription address in Epic WAM.    [name]   [facility] Specialty Technician

## 2022-10-22 DIAGNOSIS — D869 Sarcoidosis, unspecified: Principal | ICD-10-CM

## 2022-10-23 MED FILL — HADLIMA(CF) PUSHTOUCH 40 MG/0.4 ML SUBCUTANEOUS AUTO-INJECTOR: 28 days supply | Qty: 0.8 | Fill #0

## 2022-10-23 MED FILL — WEGOVY 1 MG/0.5 ML SUBCUTANEOUS PEN INJECTOR: SUBCUTANEOUS | 28 days supply | Qty: 2 | Fill #1

## 2022-10-28 DIAGNOSIS — E88819 Insulin resistance: Principal | ICD-10-CM

## 2022-10-28 MED ORDER — METFORMIN ER 500 MG TABLET,EXTENDED RELEASE 24 HR
ORAL_TABLET | Freq: Two times a day (BID) | ORAL | 0 refills | 0 days | Status: CP
Start: 2022-10-28 — End: ?

## 2022-10-29 ENCOUNTER — Encounter: Payer: BC Managed Care – PPO | Admitting: Physician Assistant

## 2022-11-08 ENCOUNTER — Ambulatory Visit
Admission: RE | Admit: 2022-11-08 | Discharge: 2022-11-08 | Disposition: A | Discharge status: Home / Self Care | Payer: BC Managed Care – PPO | Source: Ambulatory Visit | Attending: Obstetrics and Gynecology | Admitting: Obstetrics and Gynecology

## 2022-11-08 DIAGNOSIS — Z1231 Encounter for screening mammogram for malignant neoplasm of breast: Secondary | ICD-10-CM | POA: Insufficient documentation

## 2022-11-19 ENCOUNTER — Encounter: Payer: BC Managed Care – PPO | Admitting: Physician Assistant

## 2022-11-19 NOTE — Unmapped (Signed)
[facility] Refill Coordination Note    Specialty Medication(s) to be Shipped:   Inflammatory Disorders: Hadlima    Other medication(s) to be shipped:  Wegovy     Peggy Rojas, DOB: 1980/11/09  Phone: There are no phone numbers on file.      All above HIPAA information was verified with patient.     Was a Nurse, learning disability used for this call? No    Completed refill call assessment today to schedule patient's medication shipment from the [facility] [phone]).  All relevant notes have been reviewed.     Specialty medication(s) and dose(s) confirmed: Regimen is correct and unchanged.   Changes to medications: Peggy Rojas reports no changes at this time.  Changes to insurance: No  New side effects reported not previously addressed with a pharmacist or physician: None reported  Questions for the pharmacist: No    Confirmed patient received a Conservation officer, historic buildings and a Surveyor, mining with first shipment. The patient will receive a drug information handout for each medication shipped and additional FDA Medication Guides as required.       DISEASE/MEDICATION-SPECIFIC INFORMATION        For patients on injectable medications: Patient currently has 1 doses left.  Next injection is scheduled for 11/19/22.    SPECIALTY MEDICATION ADHERENCE     Medication Adherence    Specialty Medication: HADLIMA              Were doses missed due to medication being on hold? No    Hadlima 40 mg/ml: 16 days of medicine on hand       REFERRAL TO PHARMACIST     Referral to the pharmacist: Not needed      [facility]     Shipping address confirmed in Epic.       Delivery Scheduled: Yes, Expected medication delivery date: 11/26/22.     Medication will be delivered via Same Day Courier to the prescription address in Epic WAM.    Peggy Rojas, PharmD   [facility] Specialty Pharmacist

## 2022-11-26 MED FILL — WEGOVY 1 MG/0.5 ML SUBCUTANEOUS PEN INJECTOR: SUBCUTANEOUS | 28 days supply | Qty: 2 | Fill #2

## 2022-11-26 MED FILL — HADLIMA(CF) PUSHTOUCH 40 MG/0.4 ML SUBCUTANEOUS AUTO-INJECTOR: 28 days supply | Qty: 0.8 | Fill #1

## 2022-12-03 ENCOUNTER — Ambulatory Visit
Admit: 2022-12-03 | Discharge: 2022-12-04 | Payer: PRIVATE HEALTH INSURANCE | Attending: Family Medicine | Primary: Family Medicine

## 2022-12-03 DIAGNOSIS — E669 Obesity, unspecified: Principal | ICD-10-CM

## 2022-12-03 DIAGNOSIS — E88819 Insulin resistance: Principal | ICD-10-CM

## 2022-12-03 MED ORDER — WEGOVY 1.7 MG/0.75 ML SUBCUTANEOUS PEN INJECTOR
SUBCUTANEOUS | 2 refills | 28 days | Status: CP
Start: 2022-12-03 — End: ?
  Filled 2022-12-20: qty 3, 28d supply, fill #0

## 2022-12-03 MED ORDER — METFORMIN ER 500 MG TABLET,EXTENDED RELEASE 24 HR
ORAL_TABLET | Freq: Two times a day (BID) | ORAL | 2 refills | 90 days | Status: CP
Start: 2022-12-03 — End: ?

## 2022-12-03 NOTE — Unmapped (Unsigned)
[facility] Follow Up    Assessment/Plan:     Chief Complaint   Patient presents with    Weight Loss     No main concerns       Problem List Items Addressed This Visit       Class 1 obesity without serious comorbidity in adult     Peggy Rojas is a 42 y.o. female with Hx of Class 3, Stage 1 obesity due to weight gain in her 30s caused by unhealthy lifestyle, including physical inactivity and frequent consumption of ultraprocessed foods and products with high amounts of added sugars. Comorbidities include insulin resistance, vitamin D deficiency. Barriers include nothing specific at this time.    Weight Summary:  Starting weight/BMI/WC/[place]: 257 lbs, BMI 40.82, WC 43, [place] 13 (02/07/2022)  Target weight/goal: No goal weight. Patient wants general improvement in health and appearance.  Today's weight/BMI: 89.4 kg (197 lb), BMI (!) 31.29 (12/03/2022)   % Body weight loss: 23.3%  Today's Waist Circumference: 37.5 inches (12/03/2022)    PCP is in [place] through [employer] (private practice.)     Referred by: A friend who is also our patient.    GOALS: 1) Continue healthy eating habits  2) Continue physical activity routine  3) Increase Wegovy, continue metformin    I have reviewed the patient's medical history, lifestyle history and labs/tests.   My recommendations include the following:    Lifestyle Pattern Summary: Pt continues healthy lifestyle habits including minimal fast foods and no SSBs. Has also continued going to the gym! Congratulated Pt on great habits.  - See GOALS above.     Sleep: Currently sleeping 7 hours without sleep aids. STOP-BANG negative (2/8).  -- Recommend 7-8 hours of sleep at night.    Stress management: Pt reports having good stress management techniques such as reading.     Weight gain causing medication: None.    Medication: Taking metformin and wegovy as directed. Tolerating well. Denies s/e. Discussed and decided to increase current dose of Wegovy. CONTINUE Metformin 500 mg twice a day with meals, INCREASE Wegovy 1.0 mg --> 1.7 mg once weekly injections  - Wegovy: maybe started 02/07/22 at 257 lbs  - Metformin: started 02/07/2022 at 257 lbs.  Tried/Contraindicated:  - Phentermine: WL of 19 lbs, couldn't tolerate d/t headaches and insomnia    Obesity Surgery: Informed Pt about surgical options. Declines for now. Wishes to try medical management but will consider in the future. Pt verbalized that if Reginal Lutes is not covered, she would like to seriously consider surgery. (Updated 02/07/2022)    Medical conditions:  Glaucoma: no  Seizures: no  Medullary thyroid cancer (personal or family hx): no  Multiple Endocrine Neoplasia: no  Palpitations/Tachycardia: no  Chest Pain: no past history of MI.   Headaches/Migraines: no  Nephrolithiasis: no  H/o pancreatitis: no  GERD: no     Prior Surgeries:  Cholecystectomy: no  Hysterectomy: no     Birth Control Methods: OCP         Insulin resistance - Primary     Reviewed relevant medications and labs. Pt is following our Weight Management Clinic. See Obesity tab for medication changes.          Relevant Medications    metFORMIN (GLUCOPHAGE-XR) 500 MG 24 hr tablet        Return in about 3 months (around 03/05/2023) for Weight clinic F/U one slot. cancel june appt. .    I have reviewed and addressed the  patient???s adherence and response to prescribed medications. I have identified patient barriers to following the proposed medication and treatment plan, and have noted opportunities to optimize healthy behaviors. I have answered the patient???s questions to satisfaction and the patient voices understanding.    Time: Greater than 50% of this encounter was spent in direct consultation with the patient in evaluation and discussing all of the above. Duration of encounter: 30 minutes.     HPI:     Peggy Rojas is a 42 y.o. female who  has a past medical history of Essential hypertension (02/07/2022) and Obesity ([id number]). who presents today for [facility] follow up.    Weight Management History  Wt Readings from Last 6 Encounters:   12/03/22 89.4 kg (197 lb)   09/03/22 96.5 kg (212 lb 12.8 oz)   07/02/22 (!) 101.9 kg (224 lb 9.6 oz)   05/02/22 (!) 107.5 kg (237 lb 1.3 oz)   02/07/22 (!) 116.6 kg (257 lb)         02/07/2022     1:00 PM 05/02/2022     2:41 PM 07/02/2022    10:28 AM 09/03/2022     9:54 AM 12/03/2022     1:39 PM   Waist Circumference   Waist Circumference 43 inches 41.5 inches 38.5 inches 38 inches 37.5 inches       Update: Doing well. Weight plateau of 15 lbs loss in 3 months.   Medication: Taking Wegovy 1 mg, no s/e reported. Interested in increasing dose. Also taking metformin and tolerating well.    Eating Pattern:  - Breakfast: Eggs, Oatmeal, and [place] yogurt  - Lunch: Salad, Leftovers, and Frozen Meal  - Dinner: Salad, Chicken, Red meat, Fish, Frozen Meal, and Veggies  - Drinks: Water and Coffee  - Snacks: Fruit, Granola Bars, Nuts, and Crackers/cheese  Physical Activity:  - Type: Walk and Lift weights, Frequency: 45 mins 2-4 times per week  Barrier: nothing specific    Lab Results   Component Value Date    LDL 95 02/07/2022    HDL 56 02/07/2022    A1C 5.5 02/07/2022    GLU 87 02/07/2022    TSH 1.103 02/07/2022    AST 23 02/07/2022    ALT 24 02/07/2022     Past Medical/Surgical History:     Past Medical History:   Diagnosis Date    Essential hypertension 02/07/2022    Obesity [id number]     Past Surgical History:   Procedure Laterality Date    BREAST SURGERY  2016       Social History:     Social History     Socioeconomic History    Marital status: Married     Spouse name: None    Number of children: None    Years of education: None    Highest education level: None   Tobacco Use    Smoking status: Never    Smokeless tobacco: Never   Vaping Use    Vaping status: Never Used   Substance and Sexual Activity    Alcohol use: Not Currently    Drug use: Never    Sexual activity: Yes     Partners: Male     Birth control/protection: Diaphragm       Family History:     History reviewed. No pertinent family history.    Allergies:     Patient has no known allergies.    Current Medications:     Current Outpatient Medications  Medication Sig Dispense Refill    adalimumab-bwwd (HADLIMA,CF, PUSHTOUCH) 40 mg/0.4 mL AtIn Inject the contents of 1 pen (40 mg total) under the skin every fourteen (14) days. 2.4 mL 5    adalimumab-bwwd (HADLIMA,CF, PUSHTOUCH) 40 mg/0.4 mL AtIn Inject the contents of 1 pen (40 mg total) subcutaneously every 14 (fourteen) days 2 mL 5    chlorthalidone (HYGROTON) 25 MG tablet Take 1 tablet (25 mg total) by mouth every morning. 90 tablet 3    empty container Misc Use as directed to dispose of injectable medications 1 each 3    ibuprofen (MOTRIN) 600 MG tablet Take by mouth every hour as needed.      LARIN 24 FE 1 mg-20 mcg (24)/75 mg (4) Tab Take 1 tablet by mouth daily.      metFORMIN (GLUCOPHAGE-XR) 500 MG 24 hr tablet Take 1 tablet (500 mg total) by mouth in the morning and 1 tablet (500 mg total) in the evening. Take with meals. 180 tablet 2    WEGOVY 1.7 MG/0.75 ML SUBCUTANEOUS PEN INJECTOR Inject 0.75 mL (1.7 mg total) under the skin every seven (7) days. 4 each 2     No current facility-administered medications for this visit.       I have reviewed and (if needed) updated the patient's problem list, medications, allergies, past medical and surgical history, social and family history.    ROS:     Unless otherwise stated in the HPI:  CONSTITUTIONAL: no fever chills.   HEENT: Eyes: No diplopia or blurred vision. ENT: No earache, sore throat or runny nose.   CARDIOVASCULAR: No pressure, squeezing, strangling, tightness, heaviness or aching about the chest, neck, axilla or epigastrium.   RESPIRATORY: No cough, shortness of breath, PND or orthopnea.   GASTROINTESTINAL: No nausea, vomiting or diarrhea.   GENITOURINARY: No dysuria, frequency or urgency.   MUSCULOSKELETAL: no new pains, no joint swelling or redness.   SKIN: No change in skin, hair or nails.   NEUROLOGIC: No paresthesias, fasciculations, seizures or weakness.   PSYCHIATRIC: No disorder of thought or mood.   ENDOCRINE: No heat or cold intolerance, polyuria or polydipsia.   HEMATOLOGICAL: No easy bruising or bleeding.    Vital Signs:     Body mass index is 31.29 kg/m??. Waist Circumference: 37.5 inches    Wt Readings from Last 3 Encounters:   12/03/22 89.4 kg (197 lb)   09/03/22 96.5 kg (212 lb 12.8 oz)   07/02/22 (!) 101.9 kg (224 lb 9.6 oz)     Temp Readings from Last 3 Encounters:   12/03/22 35.7 ??C (96.2 ??F) (Temporal)   09/03/22 36.3 ??C (97.3 ??F)   07/02/22 36.4 ??C (97.6 ??F) (Temporal)     BP Readings from Last 3 Encounters:   12/03/22 126/82   09/03/22 144/84   07/02/22 146/89     Pulse Readings from Last 3 Encounters:   12/03/22 81   09/03/22 76   07/02/22 73         02/07/2022     1:00 PM 05/02/2022     2:41 PM 07/02/2022    10:28 AM 09/03/2022     9:54 AM 12/03/2022     1:39 PM   Waist Circumference   Waist Circumference 43 inches 41.5 inches 38.5 inches 38 inches 37.5 inches          Physical Exam:     General: well appearing, in NAD, Body mass index is 31.29 kg/m??. Ambulatory without help.  Body fat distribution:  General adiposity. No supraclavicular adiposity. No dorsal adiposity. Waist Circumference: 37.5 inches     Head: normocephalic atraumatic.  Eyes: PERRLA, EOMI, Sclera WNL.   Neck: supple, no LAD, no thyromegaly.  CV: RRR no rubs or murmurs.  Lungs: clear bilaterally to auscultation. No wheezing.  Extremities: no clubbing, cyanosis, No edema.  Skin: no concerning lesions, rashes observed. No lipomas, no acanthosis nigricans.  Neuro: Alert and oriented X 3.    Labs:     No visits with results within 1 Month(s) from this visit.   Latest known visit with results is:   Office Visit on 02/07/2022   Component Date Value Ref Range Status    TSH 02/07/2022 1.103  0.550 - 4.780 uIU/mL Final    Triglycerides 02/07/2022 80  0 - 150 mg/dL Final Cholesterol 13/02/6577 167  <=200 mg/dL Final    HDL 46/96/2952 56  40 - 60 mg/dL Final    LDL Calculated 02/07/2022 95  40 - 99 mg/dL Final    NHLBI Recommended Ranges, LDL Cholesterol, for Adults (20+yrs) (ATPIII), mg/dL  Optimal              <841  Near Optimal        100-129  Borderline High     130-159  High                160-189  Very High            >=190  NHLBI Recommended Ranges, LDL Cholesterol, for Children (2-19 yrs), mg/dL  Desirable            <324  Borderline High     110-129  High                 >=130      VLDL Cholesterol Cal 02/07/2022 16  9 - 37 mg/dL Final    Chol/HDL Ratio 02/07/2022 3.0  1.0 - 4.5 Final    Non-HDL Cholesterol 02/07/2022 111  70 - 130 mg/dL Final    Non-HDL Cholesterol Recommended Ranges (mg/dL)  Optimal       <401  Near Optimal 130 - 159  Borderline High 160 - 189  High             190 - 219  Very High       >220      FASTING 02/07/2022 No   Final    Hemoglobin A1C 02/07/2022 5.5  4.8 - 5.6 % Final    Estimated Average Glucose 02/07/2022 111  mg/dL Final    Sodium 02/72/5366 141  135 - 145 mmol/L Final    Potassium 02/07/2022 3.9  3.4 - 4.8 mmol/L Final    Chloride 02/07/2022 109 (H)  98 - 107 mmol/L Final    CO2 02/07/2022 25.4  20.0 - 31.0 mmol/L Final    Anion Gap 02/07/2022 7  5 - 14 mmol/L Final    BUN 02/07/2022 11  9 - 23 mg/dL Final    Creatinine 44/09/4740 0.75  0.60 - 0.80 mg/dL Final    BUN/Creatinine Ratio 02/07/2022 15   Final    eGFR CKD-EPI (2021) Female 02/07/2022 >90  >=60 mL/min/1.27m2 Final    eGFR calculated with CKD-EPI 2021 equation in accordance with [employer] and [employer] of Nephrology Task Force recommendations.    Glucose 02/07/2022 87  70 - 179 mg/dL Final    Calcium 59/56/3875 9.7  8.7 - 10.4 mg/dL Final    Albumin 64/33/2951 3.8  3.4 -  5.0 g/dL Final    Total Protein 02/07/2022 7.6  5.7 - 8.2 g/dL Final    Total Bilirubin 02/07/2022 0.2 (L)  0.3 - 1.2 mg/dL Final    AST 16/04/9603 23  <=34 U/L Final    ALT 02/07/2022 24  10 - 49 U/L Final    Alkaline Phosphatase 02/07/2022 59  46 - 116 U/L Final       Follow-up:     Return in about 3 months (around 03/05/2023) for Weight clinic F/U one slot. cancel june appt. .    I attest that I, Constance Goltz, personally documented this note while acting as scribe for Marshell Garfinkel, MD.      Constance Goltz, Scribe.  12/03/2022     The documentation recorded by the scribe accurately reflects the service I personally performed and the decisions made by me.    Marshell Garfinkel, MD

## 2022-12-03 NOTE — Unmapped (Addendum)
Peggy Rojas is a 42 y.o. female with Hx of Class 3, Stage 1 obesity due to weight gain in her 30s caused by unhealthy lifestyle, including physical inactivity and frequent consumption of ultraprocessed foods and products with high amounts of added sugars. Comorbidities include insulin resistance, vitamin D deficiency. Barriers include nothing specific at this time.    Weight Summary:  Starting weight/BMI/WC/[place]: 257 lbs, BMI 40.82, WC 43, [place] 13 (02/07/2022)  Target weight/goal: No goal weight. Patient wants general improvement in health and appearance.  Today's weight/BMI: 89.4 kg (197 lb), BMI (!) 31.29 (12/03/2022)   % Body weight loss: 23.3%  Today's Waist Circumference: 37.5 inches (12/03/2022)    PCP is in [employer] through [employer] (private practice.)     Referred by: A friend who is also our patient.    GOALS: 1) Continue healthy eating habits  2) Continue physical activity routine  3) Increase Wegovy, continue metformin    I have reviewed the patient's medical history, lifestyle history and labs/tests.   My recommendations include the following:    Lifestyle Pattern Summary: Pt continues healthy lifestyle habits including minimal fast foods and no SSBs. Has also continued going to the gym! Congratulated Pt on great habits.  - See GOALS above.     Sleep: Currently sleeping 7 hours without sleep aids. STOP-BANG negative (2/8).  -- Recommend 7-8 hours of sleep at night.    Stress management: Pt reports having good stress management techniques such as reading.     Weight gain causing medication: None.    Medication: Taking metformin and wegovy as directed. Tolerating well. Denies s/e. Discussed and decided to increase current dose of Wegovy.   CONTINUE Metformin 500 mg twice a day with meals, INCREASE Wegovy 1.0 mg --> 1.7 mg once weekly injections  - Wegovy: maybe started 02/07/22 at 257 lbs  - Metformin: started 02/07/2022 at 257 lbs.  Tried/Contraindicated:  - Phentermine: WL of 19 lbs, couldn't tolerate d/t headaches and insomnia    Obesity Surgery: Informed Pt about surgical options. Declines for now. Wishes to try medical management but will consider in the future. Pt verbalized that if Reginal Lutes is not covered, she would like to seriously consider surgery. (Updated 02/07/2022)    Medical conditions:  Glaucoma: no  Seizures: no  Medullary thyroid cancer (personal or family hx): no  Multiple Endocrine Neoplasia: no  Palpitations/Tachycardia: no  Chest Pain: no past history of MI.   Headaches/Migraines: no  Nephrolithiasis: no  H/o pancreatitis: no  GERD: no     Prior Surgeries:  Cholecystectomy: no  Hysterectomy: no     Birth Control Methods: OCP

## 2022-12-03 NOTE — Unmapped (Signed)
Reviewed relevant medications and labs. Pt is following our Weight Management Clinic. See Obesity tab for medication changes.

## 2022-12-12 NOTE — Unmapped (Signed)
[facility] Refill Coordination Note    Specialty Medication(s) to be Shipped:   Inflammatory Disorders: Hadlima    Other medication(s) to be shipped:  Wegovy     Peggy Rojas, DOB: January 14, 1981  Phone: There are no phone numbers on file.      All above HIPAA information was verified with patient.     Was a Nurse, learning disability used for this call? No    Completed refill call assessment today to schedule patient's medication shipment from the [facility] [phone]).  All relevant notes have been reviewed.     Specialty medication(s) and dose(s) confirmed: Regimen is correct and unchanged.   Changes to medications: Peggy Rojas reports no changes at this time.  Changes to insurance: No  New side effects reported not previously addressed with a pharmacist or physician: None reported  Questions for the pharmacist: No    Confirmed patient received a Conservation officer, historic buildings and a Surveyor, mining with first shipment. The patient will receive a drug information handout for each medication shipped and additional FDA Medication Guides as required.       DISEASE/MEDICATION-SPECIFIC INFORMATION        For patients on injectable medications: Patient currently has 1 doses left.  Next injection is scheduled for 12/20/22.    SPECIALTY MEDICATION ADHERENCE     Medication Adherence    Patient reported X missed doses in the last month: 0  Specialty Medication: HADLIMA(CF) PUSHTOUCH 40 mg/0.4 mL Atin (adalimumab-bwwd)  Patient is on additional specialty medications: No              Were doses missed due to medication being on hold? No    Hadlima 40 mg/ml: 16 days of medicine on hand       REFERRAL TO PHARMACIST     Referral to the pharmacist: Not needed      [facility]     Shipping address confirmed in Epic.       Delivery Scheduled: Yes, Expected medication delivery date: 12/20/22.     Medication will be delivered via Same Day Courier to the prescription address in Epic WAM.    Peggy Rojas Shared [facility] Specialty Technician

## 2022-12-16 MED ORDER — EMPTY CONTAINER
3 refills | 0.00000 days | Status: CN
Start: 2022-12-16 — End: ?

## 2022-12-20 MED FILL — HADLIMA(CF) PUSHTOUCH 40 MG/0.4 ML SUBCUTANEOUS AUTO-INJECTOR: 28 days supply | Qty: 0.8 | Fill #2

## 2022-12-26 MED ORDER — EMPTY CONTAINER
2 refills | 0 days
Start: 2022-12-26 — End: ?

## 2022-12-26 MED FILL — EMPTY CONTAINER: 120 days supply | Qty: 1 | Fill #0

## 2023-01-13 DIAGNOSIS — H209 Unspecified iridocyclitis: Principal | ICD-10-CM

## 2023-01-13 DIAGNOSIS — D869 Sarcoidosis, unspecified: Principal | ICD-10-CM

## 2023-01-14 NOTE — Unmapped (Signed)
01/13/23-PA submitted for Hadlima- ADD

## 2023-01-20 NOTE — Unmapped (Signed)
[facility] Pharm[facility] Refill Coordination Note    Peggy Rojas, DOB: Jul 30, 1980  Phone: There are no phone numbers on file.      All above HIPAA information was verified with patient.         01/20/2023    11:54 AM   Specialty Rx Medication Refill Questionnaire   Which Medications would you like refilled and shipped? Hadlima,wegovy   Please list all current allergies: None   Have you missed any doses in the last 30 days? No   Have you had any changes to your medication(s) since your last refill? No   How many days remaining of each medication do you have at home? None   If receiving an injectable medication, next injection date is 01/30/2023   Have you experienced any side effects in the last 30 days? No   Please enter the full address (street address, city, state, zip code) where you would like your medication(s) to be delivered to. [address] Peggy Rojas , [place] [address]   Please specify on which day you would like your medication(s) to arrive. Note: if you need your medication(s) within 3 days, please call the pharmacy to schedule your order at [phone]  01/24/2023   Has your insurance changed since your last refill? No   Would you like a pharmacist to call you to discuss your medication(s)? No   Do you require a signature for your package? (Note: if we are billing Medicare Part B or your order contains a controlled substance, we will require a signature) No         Completed refill call assessment today to schedule patient's medication shipment from the [facility] [phone]).  All relevant notes have been reviewed.       Confirmed patient received a Conservation officer, historic buildings and a Surveyor, mining with first shipment. The patient will receive a drug information handout for each medication shipped and additional FDA Medication Guides as required.         REFERRAL TO PHARMACIST     Referral to the pharmacist: Not needed      [facility]     Shipping address confirmed in Epic.     Delivery Scheduled: Yes, Expected medication delivery date: 7/12.     Medication will be delivered via Same Day Courier to the prescription address in Epic WAM.    Peggy Rojas Shared [facility] Specialty Technician

## 2023-01-23 NOTE — Unmapped (Signed)
[facility] Specialty Medication Onboarding    Specialty Medication: WEGOVY 1.7 mg/0.75 mL injection pen (semaglutide (weight loss))  Prior Authorization: Approved   Financial Assistance: No - copay  <$25  Final Copay/Day Supply: $24.99 / 28    Insurance Restrictions: None     Notes to Pharmacist: refill  Credit Card on File: no    The triage team has completed the benefits investigation and has determined that the patient is able to fill this medication at [facility]. Please contact the patient to complete the onboarding or follow up with the prescribing physician as needed.

## 2023-01-24 NOTE — Unmapped (Signed)
Peggy Rojas's shipment has been rescheduled to deliver on 01/27/23 via same day courier.

## 2023-01-24 NOTE — Unmapped (Signed)
Peggy Rojas 's HADLIMA(CF) PUSHTOUCH 40 mg/0.4 mL Atin (adalimumab-bwwd) shipment will be delayed as a result of a high copay.     I have reached out to the patient  at [phone] and left a voicemail message.  We will call the patient back to reschedule the delivery upon resolution. We have not confirmed the new delivery date.

## 2023-01-26 DIAGNOSIS — I10 Essential (primary) hypertension: Principal | ICD-10-CM

## 2023-01-26 MED ORDER — CHLORTHALIDONE 25 MG TABLET
ORAL_TABLET | Freq: Every morning | ORAL | 0 refills | 0 days
Start: 2023-01-26 — End: ?

## 2023-01-27 MED ORDER — CHLORTHALIDONE 25 MG TABLET
ORAL_TABLET | Freq: Every morning | ORAL | 0 refills | 90 days | Status: CP
Start: 2023-01-27 — End: ?

## 2023-01-27 MED FILL — WEGOVY 1.7 MG/0.75 ML SUBCUTANEOUS PEN INJECTOR: SUBCUTANEOUS | 28 days supply | Qty: 3 | Fill #1

## 2023-01-27 MED FILL — HADLIMA(CF) PUSHTOUCH 40 MG/0.4 ML SUBCUTANEOUS AUTO-INJECTOR: 28 days supply | Qty: 0.8 | Fill #3

## 2023-01-28 DIAGNOSIS — I10 Essential (primary) hypertension: Principal | ICD-10-CM

## 2023-01-28 MED ORDER — CHLORTHALIDONE 25 MG TABLET
ORAL_TABLET | Freq: Every morning | ORAL | 0 refills | 90 days
Start: 2023-01-28 — End: ?

## 2023-01-29 NOTE — Unmapped (Signed)
We have recently filled a prescription for [facility] for [name]. The patient is currently enrolled in the Rheumatology specialty queue. As part of an expansion of services for [name] our team will now provide additional follow-up for this medication.    Darryl Nestle, PharmD  [facility] Specialty and Home Delivery Pharmacist

## 2023-02-14 NOTE — Unmapped (Signed)
[facility] Pharm[facility] Refill Coordination Note    Specialty Medication(s) to be Shipped:   Inflammatory Disorders: Hadlima    Other medication(s) to be shipped:  Wegovy      Peggy Rojas, DOB: 06-Jun-1981  Phone: [phone] (home)       All above HIPAA information was verified with patient.     Was a Nurse, learning disability used for this call? No    Completed refill call assessment today to schedule patient's medication shipment from the [facility] [phone]).  All relevant notes have been reviewed.     Specialty medication(s) and dose(s) confirmed: Regimen is correct and unchanged.   Changes to medications: Macyn reports no changes at this time.  Changes to insurance: No  New side effects reported not previously addressed with a pharmacist or physician: None reported  Questions for the pharmacist: No    Confirmed patient received a Conservation officer, historic buildings and a Surveyor, mining with first shipment. The patient will receive a drug information handout for each medication shipped and additional FDA Medication Guides as required.       DISEASE/MEDICATION-SPECIFIC INFORMATION        For patients on injectable medications: Patient currently has 0 doses left.  Next injection is scheduled for 02/27/23.    SPECIALTY MEDICATION ADHERENCE              Were doses missed due to medication being on hold? No    Hadlima 40 mg/ml: 13 days of medicine on hand       REFERRAL TO PHARMACIST     Referral to the pharmacist: Not needed      [facility]     Shipping address confirmed in Epic.       Delivery Scheduled: Yes, Expected medication delivery date: 02/19/23.     Medication will be delivered via Same Day Courier to the prescription address in Epic WAM.    Sherral Hammers, PharmD   [facility] Specialty Pharmacist

## 2023-02-19 MED FILL — HADLIMA(CF) PUSHTOUCH 40 MG/0.4 ML SUBCUTANEOUS AUTO-INJECTOR: SUBCUTANEOUS | 28 days supply | Qty: 0.8 | Fill #0

## 2023-02-19 MED FILL — WEGOVY 1.7 MG/0.75 ML SUBCUTANEOUS PEN INJECTOR: SUBCUTANEOUS | 28 days supply | Qty: 3 | Fill #2

## 2023-03-03 NOTE — Unmapped (Signed)
Previous A1C of 5.5%. No acanthosis nigricans on PE. Reviewed relevant medications and labs. Patient is following our Weight Management Clinic. See Obesity tab for medication changes.    Lab Results   Component Value Date    A1C 5.5 02/07/2022

## 2023-03-03 NOTE — Unmapped (Signed)
[facility] Follow Up    Assessment/Plan:     No chief complaint on file.      Problem List Items Addressed This Visit          Unprioritized    Overweight (BMI 25.0-29.9)     Peggy Rojas is a 42 y.o. 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     Comorbidities: HTN, insulin resistance, vitamin D def.   Barriers: nothing specific at this time.    Weight Summary:  Starting weight/BMI/WC/[employer]: 257 lbs, BMI 40.82, WC 43,  13 (02/07/2022)  Target weight/goal: No goal weight. Patient wants general improvement in health and appearance.  Today's weight/BMI: 83.6 kg (184 lb 3.2 oz), BMI 29.25 (03/06/2023)   % Body weight loss: 23.3%  Today's Waist Circumference: 33 inches (03/06/2023)    PCP is in [employer] through [employer] (private practice.)     Referred by: A friend who is also our patient.    GOALS: 1) Continue with healthy eating pattern and physical activity regimen! 2) Talk to PCP about reducing BP medication    I have reviewed the patient's medical history, lifestyle history and labs/tests.   My recommendations include the following:    Lifestyle Pattern Summary: See GOALS above.     Weight gain causing medication: None.    Medication: Patient is tolerating Metformin 500 mg BID with meals and Wegovy 1.7 mg weekly injections well with no s/e. Discussed s/e profile and decided to continue with current medication regimen. Patient verbally agreed.     CONTINUE: Metformin 500 mg BID with meals, CONTINUE: Wegovy 1.7 mg weekly injections    Tried/Contraindicated:  - Phentermine: WL of 19 lbs, couldn't tolerate d/t headaches and insomnia    Sleep: Currently sleeping 7 hours without sleep aids. STOP-BANG negative (2/8).    Stress management: Patient reports having good stress management techniques such as reading.     Obesity Surgery: Patient is not eligible but would consider in the future if needed. Patient verbalized that if Reginal Lutes is not covered, she would like to seriously consider surgery. (Updated 02/07/2022)    MASLD/NAFLD Screening: (03/06/23)  Computed FIB-4 Calculation unavailable. One or more values for this score either were not found within the given timeframe or did not fit some other criterion.     Medical conditions:  Glaucoma: no  Seizures: no  Medullary thyroid cancer (personal or family history): no  Multiple Endocrine Neoplasia: no  Palpitations/Tachycardia: no  Chest Pain: no past history of MI.   Headaches/Migraines: no  Nephrolithiasis: no  H/o pancreatitis: no  GERD: no     Prior Surgeries:  Cholecystectomy: no  Hysterectomy: no     Birth Control Methods: OCP         Relevant Medications    WEGOVY 1.7 MG/0.75 ML SUBCUTANEOUS PEN INJECTOR    Insulin resistance - Primary     Previous A1C of 5.5%. No acanthosis nigricans on PE. Reviewed relevant medications and labs. Patient is following our Weight Management Clinic. See Obesity tab for medication changes.    Lab Results   Component Value Date    A1C 5.5 02/07/2022                   Return in about 4 months (around 07/06/2023) for Weight clinic F/U one  slot..    I have reviewed and addressed the patient???s adherence and response to prescribed medications. I have identified patient barriers to following the proposed medication and treatment plan, and have noted opportunities to optimize healthy behaviors. I have answered the patient???s questions to satisfaction and the patient voices understanding.    Time: Greater than 50% of this encounter was spent in direct consultation with the patient in evaluation and discussing all of the above. Duration of encounter: 20 minutes.     HPI:     Peggy Rojas is a 42 y.o. female who  has a past medical history of Essential hypertension (02/07/2022) and Obesity ([id number]). who presents today for [facility] follow up.    Weight Management History  Wt Readings from Last 6 Encounters:   03/06/23 83.6 kg (184 lb 3.2 oz)   12/03/22 89.4 kg (197 lb)   09/03/22 96.5 kg (212 lb 12.8 oz)   07/02/22 (!) 101.9 kg (224 lb 9.6 oz)   05/02/22 (!) 107.5 kg (237 lb 1.3 oz)   02/07/22 (!) 116.6 kg (257 lb)         02/07/2022     1:00 PM 05/02/2022     2:41 PM 07/02/2022    10:28 AM 09/03/2022     9:54 AM 12/03/2022     1:39 PM 03/06/2023    10:35 AM   Waist Circumference   Waist Circumference 43 inches 41.5 inches 38.5 inches 38 inches 37.5 inches 33 inches           Update: Patient lost 13 lbs in 3 months.   Medication: Patient is tolerating Metformin 500 mg BID with meals and Wegovy 1.7 mg weekly injections well with no s/e.   Eating Pattern:  - Fast food/Restaurants/Takeout: 2-4x per week.   - Breakfast: eggs, oatmeal, greek yogurt, and fruit  - Lunch: salad, veggies, fruit, frozen meals, and leftovers  - Dinner: salad, soup, fish, veggies, and frozen meals  - Drinks: water, black coffee, and hot tea  - Snacks: fruit, vegetables, granola bars, and popcorn  Physical Activity:  walking and lifting weights  for 45 mins 2-4x per week.   Barrier: None discussed.     Lab Results   Component Value Date    LDL 95 02/07/2022    HDL 56 02/07/2022    A1C 5.5 02/07/2022    GLU 87 02/07/2022    TSH 1.103 02/07/2022    AST 23 02/07/2022    ALT 24 02/07/2022     Past Medical/Surgical History:     Past Medical History:   Diagnosis Date    Essential hypertension 02/07/2022    Obesity [id number]     Past Surgical History:   Procedure Laterality Date    BREAST SURGERY  2016       Social History:     Social History     Socioeconomic History    Marital status: Married     Spouse name: None    Number of children: None    Years of education: None    Highest education level: None   Tobacco Use    Smoking status: Never    Smokeless tobacco: Never   Vaping Use    Vaping status: Never Used   Substance and Sexual Activity    Alcohol use: Not Currently    Drug use: Never    Sexual activity: Yes     Partners: Male     Birth control/protection: Diaphragm  Family History:     History reviewed. No pertinent family history.    Allergies:     Patient has no known allergies.    Current Medications:     Current Outpatient Medications   Medication Sig Dispense Refill    metFORMIN (GLUCOPHAGE-XR) 500 MG 24 hr tablet Take 1 tablet (500 mg total) by mouth in the morning and 1 tablet (500 mg total) in the evening. Take with meals. 180 tablet 2    adalimumab-bwwd (HADLIMA,CF, PUSHTOUCH) 40 mg/0.4 mL AtIn Inject the contents of 1 pen (40 mg total) under the skin every fourteen (14) days. 2.4 mL 5    adalimumab-bwwd (HADLIMA,CF, PUSHTOUCH) 40 mg/0.4 mL AtIn Inject the contents of 1 pen (40 mg total) subcutaneously every 14 (fourteen) days 2 mL 5    adalimumab-bwwd (HADLIMA,CF, PUSHTOUCH) 40 mg/0.4 mL AtIn Inject the contents of 1 pen (40 mg) under the skin every fourteen (14) days. 0.8 mL 12    chlorthalidone (HYGROTON) 25 MG tablet TAKE 1 TABLET BY MOUTH ONCE DAILY IN THE MORNING 90 tablet 0    empty container Misc Use as directed to dispose of injectable medications 1 each 3    empty container Misc use as directed 1 each 2    ibuprofen (MOTRIN) 600 MG tablet Take by mouth every hour as needed.      LARIN 24 FE 1 mg-20 mcg (24)/75 mg (4) Tab Take 1 tablet by mouth daily.      WEGOVY 1.7 MG/0.75 ML SUBCUTANEOUS PEN INJECTOR Inject 0.75 mL (1.7 mg total) under the skin every seven (7) days. 3 mL 3     No current facility-administered medications for this visit.     I have reviewed and (if needed) updated the patient's problem list, medications, allergies, past medical and surgical history, social and family history.    ROS:     A 12 point review of systems was negative except for pertinent items noted in the HPI     Vital Signs:     Body mass index is 29.25 kg/m??. Waist Circumference: 33 inches    Wt Readings from Last 3 Encounters:   03/06/23 83.6 kg (184 lb 3.2 oz)   12/03/22 89.4 kg (197 lb)   09/03/22 96.5 kg (212 lb 12.8 oz)     Temp Readings from Last 3 Encounters:   03/06/23 36 ??C (96.8 ??F) (Temporal)   12/03/22 35.7 ??C (96.2 ??F) (Temporal)   09/03/22 36.3 ??C (97.3 ??F)     BP Readings from Last 3 Encounters:   03/06/23 128/86   12/03/22 126/82   09/03/22 144/84     Pulse Readings from Last 3 Encounters:   03/06/23 76   12/03/22 81   09/03/22 76         02/07/2022     1:00 PM 05/02/2022     2:41 PM 07/02/2022    10:28 AM 09/03/2022     9:54 AM 12/03/2022     1:39 PM 03/06/2023    10:35 AM   Waist Circumference   Waist Circumference 43 inches 41.5 inches 38.5 inches 38 inches 37.5 inches 33 inches      Physical Exam:     General: well appearing, in NAD, Body mass index is 29.25 kg/m??. Ambulatory without help.  Body fat distribution: General adiposity. No supraclavicular adiposity. No dorsal adiposity. Waist Circumference: 33 inches     Head: normocephalic atraumatic.  Eyes: PERRLA, EOMI, Sclera WNL.   Neck: supple, no LAD, no thyromegaly.  CV: RRR no rubs or murmurs.  Lungs: clear bilaterally to auscultation. No wheezing.  Extremities: no clubbing, cyanosis, No edema.  Skin: no concerning lesions, rashes observed. No lipomas, no acanthosis nigricans.  Neuro: Alert and oriented X 3.    Labs:     No visits with results within 1 Month(s) from this visit.   Latest known visit with results is:   Office Visit on 02/07/2022   Component Date Value Ref Range Status    TSH 02/07/2022 1.103  0.550 - 4.780 uIU/mL Final    Triglycerides 02/07/2022 80  0 - 150 mg/dL Final    Cholesterol 57/84/6962 167  <=200 mg/dL Final    HDL 95/28/4132 56  40 - 60 mg/dL Final    LDL Calculated 02/07/2022 95  40 - 99 mg/dL Final    NHLBI Recommended Ranges, LDL Cholesterol, for Adults (20+yrs) (ATPIII), mg/dL  Optimal              <440  Near Optimal        100-129  Borderline High     130-159  High                160-189  Very High            >=190  NHLBI Recommended Ranges, LDL Cholesterol, for Children (2-19 yrs), mg/dL  Desirable            <102  Borderline High     110-129  High                 >=130      VLDL Cholesterol Cal 02/07/2022 16  9 - 37 mg/dL Final    Chol/HDL Ratio 02/07/2022 3.0  1.0 - 4.5 Final    Non-HDL Cholesterol 02/07/2022 111  70 - 130 mg/dL Final    Non-HDL Cholesterol Recommended Ranges (mg/dL)  Optimal       <725  Near Optimal 130 - 159  Borderline High 160 - 189  High             190 - 219  Very High       >220      FASTING 02/07/2022 No   Final    Hemoglobin A1C 02/07/2022 5.5  4.8 - 5.6 % Final    Estimated Average Glucose 02/07/2022 111  mg/dL Final    Sodium 36/64/4034 141  135 - 145 mmol/L Final    Potassium 02/07/2022 3.9  3.4 - 4.8 mmol/L Final    Chloride 02/07/2022 109 (H)  98 - 107 mmol/L Final    CO2 02/07/2022 25.4  20.0 - 31.0 mmol/L Final    Anion Gap 02/07/2022 7  5 - 14 mmol/L Final    BUN 02/07/2022 11  9 - 23 mg/dL Final    Creatinine 74/25/9563 0.75  0.60 - 0.80 mg/dL Final    BUN/Creatinine Ratio 02/07/2022 15   Final    eGFR CKD-EPI (2021) Female 02/07/2022 >90  >=60 mL/min/1.75m2 Final    eGFR calculated with CKD-EPI 2021 equation in accordance with [employer] and [employer] of Nephrology Task Force recommendations.    Glucose 02/07/2022 87  70 - 179 mg/dL Final    Calcium 87/56/4332 9.7  8.7 - 10.4 mg/dL Final    Albumin 95/18/8416 3.8  3.4 - 5.0 g/dL Final    Total Protein 02/07/2022 7.6  5.7 - 8.2 g/dL Final    Total Bilirubin 02/07/2022 0.2 (L)  0.3 - 1.2 mg/dL Final  AST 02/07/2022 23  <=34 U/L Final    ALT 02/07/2022 24  10 - 49 U/L Final    Alkaline Phosphatase 02/07/2022 59  46 - 116 U/L Final       Follow-up:     Return in about 4 months (around 07/06/2023) for Weight clinic F/U one slot..    I attest that I, Donzetta Matters, personally documented this note for [facility], MD.      Donzetta Matters  03/06/2023     I attest that I have reviewed the note and that the components of the history of the present illness, the physical exam, and the assessment and plan documented were performed by me or were performed in my presence where I verified the documentation and performed (or re-performed) the exam and medical decision making.     Marshell Garfinkel, MD

## 2023-03-03 NOTE — Unmapped (Signed)
Peggy Rojas is a 42 y.o. 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     Comorbidities: insulin resistance, vitamin D def.   Barriers: nothing specific at this time.    Weight Summary:  Starting weight/BMI/WC/[place]: 257 lbs, BMI 40.82, WC 43, [place] 13 (02/07/2022)  Target weight/goal: No goal weight. Patient wants general improvement in health and appearance.  Today's weight/BMI:  , BMI   (03/06/2023)   % Body weight loss: 23.3%  Today's   (03/06/2023)    PCP is in [employer] through [employer] (private practice.)     Referred by: A friend who is also our patient.    GOALS:     I have reviewed the patient's medical history, lifestyle history and labs/tests.   My recommendations include the following:    Lifestyle Pattern Summary: See GOALS above.     Weight gain causing medication: None.    Medication:     CONTINUE: Metformin 500 mg BID with meals, CONTINUE: Wegovy 1.7 mg weekly injections    Tried/Contraindicated:  - Phentermine: WL of 19 lbs, couldn't tolerate d/t headaches and insomnia    Sleep: Currently sleeping 7 hours without sleep aids. STOP-BANG negative (2/8).    Stress management: Patient reports having good stress management techniques such as reading.     Obesity Surgery: Patient is not eligible but would consider in the future if needed. Patient verbalized that if Reginal Lutes is not covered, she would like to seriously consider surgery. (Updated 02/07/2022)    MASLD/NAFLD Screening:  Computed FIB-4 Calculation unavailable. One or more values for this score either were not found within the given timeframe or did not fit some other criterion.     Medical conditions:  Glaucoma: no  Seizures: no  Medullary thyroid cancer (personal or family history): no  Multiple Endocrine Neoplasia: no  Palpitations/Tachycardia: no  Chest Pain: no past history of MI.   Headaches/Migraines: no  Nephrolithiasis: no  H/o pancreatitis: no  GERD: no     Prior Surgeries:  Cholecystectomy: no  Hysterectomy: no     Birth Control Methods: OCP

## 2023-03-03 NOTE — Unmapped (Signed)
BP is well controlled. Reviewed relevant medications and labs. Patient is following our [facility]. See Obesity tab for medication changes.    BP Readings from Last 3 Encounters:   12/03/22 126/82   09/03/22 144/84   07/02/22 146/89

## 2023-03-06 ENCOUNTER — Ambulatory Visit
Admit: 2023-03-06 | Discharge: 2023-03-07 | Payer: PRIVATE HEALTH INSURANCE | Attending: Family Medicine | Primary: Family Medicine

## 2023-03-06 DIAGNOSIS — E663 Overweight: Principal | ICD-10-CM

## 2023-03-06 DIAGNOSIS — E88819 Insulin resistance: Principal | ICD-10-CM

## 2023-03-06 MED ORDER — WEGOVY 1.7 MG/0.75 ML SUBCUTANEOUS PEN INJECTOR
SUBCUTANEOUS | 3 refills | 28 days | Status: CP
Start: 2023-03-06 — End: ?
  Filled 2023-03-14: qty 3, 28d supply, fill #0

## 2023-03-12 NOTE — Unmapped (Signed)
[facility] Refill Coordination Note    Peggy Rojas, DOB: 1981-03-05  Phone: [phone] (home)       All above HIPAA information was verified with patient.         03/12/2023     8:33 AM   Specialty Rx Medication Refill Questionnaire   Which Medications would you like refilled and shipped? Hadlima and wegovy   Please list all current allergies: None   Have you missed any doses in the last 30 days? No   Have you had any changes to your medication(s) since your last refill? No   How many days remaining of each medication do you have at home? 1   If receiving an injectable medication, next injection date is 03/13/2023   Have you experienced any side effects in the last 30 days? No   Please enter the full address (street address, city, state, zip code) where you would like your medication(s) to be delivered to. [address] [name] [address]   Please specify on which day you would like your medication(s) to arrive. Note: if you need your medication(s) within 3 days, please call the pharmacy to schedule your order at [phone]  03/14/2023   Has your insurance changed since your last refill? No   Would you like a pharmacist to call you to discuss your medication(s)? No   Do you require a signature for your package? (Note: if we are billing Medicare Part B or your order contains a controlled substance, we will require a signature) No         Completed refill call assessment today to schedule patient's medication shipment from the [facility] [phone]).  All relevant notes have been reviewed.       Confirmed patient received a Conservation officer, historic buildings and a Surveyor, mining with first shipment. The patient will receive a drug information handout for each medication shipped and additional FDA Medication Guides as required.         REFERRAL TO PHARMACIST     Referral to the pharmacist: Not needed      [facility]     Shipping address confirmed in Epic. Delivery Scheduled: Yes, Expected medication delivery date: 8/30.     Medication will be delivered via Same Day Courier to the prescription address in Epic WAM.    Gaspar Cola [facility] Specialty Technician

## 2023-03-14 MED FILL — HADLIMA(CF) PUSHTOUCH 40 MG/0.4 ML SUBCUTANEOUS AUTO-INJECTOR: 28 days supply | Qty: 0.8 | Fill #4

## 2023-03-31 NOTE — Unmapped (Signed)
[facility] Refill Coordination Note    Peggy Rojas, DOB: 1981/04/05  Phone: [phone] (home)       All above HIPAA information was verified with patient.         03/31/2023     1:06 PM   Specialty Rx Medication Refill Questionnaire   Which Medications would you like refilled and shipped? Hadlima and wegovy   Please list all current allergies: None   Have you missed any doses in the last 30 days? No   Have you had any changes to your medication(s) since your last refill? No   How many days remaining of each medication do you have at home? 1   If receiving an injectable medication, next injection date is 04/10/2023   Have you experienced any side effects in the last 30 days? No   Please enter the full address (street address, city, state, zip code) where you would like your medication(s) to be delivered to. [address] [name] , [place] [address]   Please specify on which day you would like your medication(s) to arrive. Note: if you need your medication(s) within 3 days, please call the pharmacy to schedule your order at [phone]  04/10/2023   Has your insurance changed since your last refill? No   Would you like a pharmacist to call you to discuss your medication(s)? No   Do you require a signature for your package? (Note: if we are billing Medicare Part B or your order contains a controlled substance, we will require a signature) No         Completed refill call assessment today to schedule patient's medication shipment from the [facility] [phone]).  All relevant notes have been reviewed.       Confirmed patient received a Conservation officer, historic buildings and a Surveyor, mining with first shipment. The patient will receive a drug information handout for each medication shipped and additional FDA Medication Guides as required.         REFERRAL TO PHARMACIST     Referral to the pharmacist: Not needed      [facility]     Shipping address confirmed in Epic.     Delivery Scheduled: Yes, Expected medication delivery date: 9/26.     Medication will be delivered via Same Day Courier to the prescription address in Epic WAM.    [facility] Specialty Technician

## 2023-04-03 MED ORDER — HADLIMA(CF) PUSHTOUCH 40 MG/0.4 ML SUBCUTANEOUS AUTO-INJECTOR
SUBCUTANEOUS | 5 refills | 70 days
Start: 2023-04-03 — End: ?

## 2023-04-10 MED FILL — HADLIMA(CF) PUSHTOUCH 40 MG/0.4 ML SUBCUTANEOUS AUTO-INJECTOR: SUBCUTANEOUS | 28 days supply | Qty: 0.8 | Fill #1

## 2023-04-11 MED FILL — WEGOVY 1.7 MG/0.75 ML SUBCUTANEOUS PEN INJECTOR: SUBCUTANEOUS | 28 days supply | Qty: 3 | Fill #1

## 2023-04-25 DIAGNOSIS — I10 Essential (primary) hypertension: Principal | ICD-10-CM

## 2023-04-25 MED ORDER — CHLORTHALIDONE 25 MG TABLET
ORAL_TABLET | Freq: Every morning | ORAL | 0 refills | 0 days
Start: 2023-04-25 — End: ?

## 2023-04-25 NOTE — Unmapped (Signed)
Unable to complete refill request, as patient is due for labs.  Please review below request.     Patient is requesting the following refill  Requested Prescriptions     Pending Prescriptions Disp Refills    chlorthalidone (HYGROTON) 25 MG tablet [Pharmacy Med Name: Chlorthalidone 25 MG Oral Tablet] 90 tablet 0     Sig: TAKE 1 TABLET BY MOUTH ONCE DAILY IN THE MORNING       Recent Visits  Date Type Provider Dept   03/06/23 Office Visit Ro, Honor Junes, MD [facility] [address] [facility]   12/03/22 Office Visit Ro, Honor Junes, MD [facility] [address] [facility]   09/03/22 Office Visit Ro, Honor Junes, MD [facility] [address] [facility]   07/02/22 Office Visit Ro, Honor Junes, MD [facility] [address] [facility]   05/02/22 Office Visit Ro, Honor Junes, MD [facility] [address] [place]   Showing recent visits within past 365 days and meeting all other requirements  Future Appointments  Date Type Provider Dept   07/29/23 Appointment Ro, Honor Junes, MD [facility] [address] [place]   Showing future appointments within next 365 days and meeting all other requirements       Labs: Creatinine:   Creatinine   Date Value   02/07/2022 0.75 mg/dL   62/13/0865 7.84 MG/DL    Potassium:   Potassium   Date Value   02/07/2022 3.9 mmol/L   05/07/2012 4.9 MMOL/L    Sodium:   Sodium   Date Value   02/07/2022 141 mmol/L   05/07/2012 137 MMOL/L    Vitals:   BP Readings from Last 3 Encounters:   03/06/23 128/86   12/03/22 126/82   09/03/22 144/84    and   Pulse Readings from Last 3 Encounters:   03/06/23 76   12/03/22 81   09/03/22 76

## 2023-04-28 DIAGNOSIS — D869 Sarcoidosis, unspecified: Principal | ICD-10-CM

## 2023-04-28 DIAGNOSIS — H209 Unspecified iridocyclitis: Principal | ICD-10-CM

## 2023-04-28 MED ORDER — CHLORTHALIDONE 25 MG TABLET
ORAL_TABLET | Freq: Every morning | ORAL | 0 refills | 90 days
Start: 2023-04-28 — End: ?

## 2023-04-28 NOTE — Unmapped (Signed)
Pl call pt and let her know she should be getting her bp checked and labs checked for bp med refills with her PCP. Dr. Loa Socks

## 2023-05-01 ENCOUNTER — Other Ambulatory Visit: Payer: Self-pay

## 2023-05-01 ENCOUNTER — Emergency Department: Payer: BC Managed Care – PPO

## 2023-05-01 ENCOUNTER — Emergency Department
Admission: EM | Admit: 2023-05-01 | Discharge: 2023-05-01 | Disposition: A | Discharge status: Home / Self Care | Payer: BC Managed Care – PPO | Attending: Emergency Medicine | Admitting: Emergency Medicine

## 2023-05-01 ENCOUNTER — Encounter: Payer: Self-pay | Admitting: Obstetrics and Gynecology

## 2023-05-01 DIAGNOSIS — D259 Leiomyoma of uterus, unspecified: Secondary | ICD-10-CM | POA: Diagnosis not present

## 2023-05-01 DIAGNOSIS — R109 Unspecified abdominal pain: Secondary | ICD-10-CM | POA: Diagnosis present

## 2023-05-01 LAB — COMPREHENSIVE METABOLIC PANEL
ALT: 11 U/L (ref 0–44)
AST: 14 U/L — ABNORMAL LOW (ref 15–41)
Albumin: 4 g/dL (ref 3.5–5.0)
Alkaline Phosphatase: 40 U/L (ref 38–126)
Anion gap: 9 (ref 5–15)
BUN: 12 mg/dL (ref 6–20)
CO2: 24 mmol/L (ref 22–32)
Calcium: 8.7 mg/dL — ABNORMAL LOW (ref 8.9–10.3)
Chloride: 99 mmol/L (ref 98–111)
Creatinine, Ser: 0.72 mg/dL (ref 0.44–1.00)
GFR, Estimated: >60 mL/min (ref >60)
Glucose, Bld: 90 mg/dL (ref 70–99)
Potassium: 2.5 mmol/L — CL (ref 3.5–5.1)
Sodium: 132 mmol/L — ABNORMAL LOW (ref 135–145)
Total Bilirubin: 0.4 mg/dL (ref 0.3–1.2)
Total Protein: 7.8 g/dL (ref 6.5–8.1)

## 2023-05-01 LAB — CBC WITH DIFFERENTIAL/PLATELET
Abs Immature Granulocytes: 0.01 10*3/uL (ref 0.00–0.07)
Basophils Absolute: 0.1 10*3/uL (ref 0.0–0.1)
Basophils Relative: 1 %
Eosinophils Absolute: 0 10*3/uL (ref 0.0–0.5)
Eosinophils Relative: 1 %
HCT: 42.1 % (ref 36.0–46.0)
Hemoglobin: 13.6 g/dL (ref 12.0–15.0)
Immature Granulocytes: 0 %
Lymphocytes Relative: 23 %
Lymphs Abs: 1.9 10*3/uL (ref 0.7–4.0)
MCH: 26.6 pg (ref 26.0–34.0)
MCHC: 32.3 g/dL (ref 30.0–36.0)
MCV: 82.2 fL (ref 80.0–100.0)
Monocytes Absolute: 0.3 10*3/uL (ref 0.1–1.0)
Monocytes Relative: 4 %
Neutro Abs: 6 10*3/uL (ref 1.7–7.7)
Neutrophils Relative %: 71 %
Platelets: 323 10*3/uL (ref 150–400)
RBC: 5.12 MIL/uL — ABNORMAL HIGH (ref 3.87–5.11)
RDW: 14.6 % (ref 11.5–15.5)
WBC: 8.3 10*3/uL (ref 4.0–10.5)
nRBC: 0 % (ref 0.0–0.2)

## 2023-05-01 LAB — URINALYSIS, ROUTINE W REFLEX MICROSCOPIC
Bacteria, UA: NONE SEEN
Bilirubin Urine: NEGATIVE
Glucose, UA: NEGATIVE mg/dL
Ketones, ur: 5 mg/dL — AB
Leukocytes,Ua: NEGATIVE
Nitrite: NEGATIVE
Protein, ur: 30 mg/dL — AB
RBC / HPF: >50 RBC/hpf (ref 0–5)
Specific Gravity, Urine: 1.033 — ABNORMAL HIGH (ref 1.005–1.030)
pH: 5 (ref 5.0–8.0)

## 2023-05-01 LAB — WET PREP, GENITAL
Clue Cells Wet Prep HPF POC: NONE SEEN
Sperm: NONE SEEN
Trich, Wet Prep: NONE SEEN
WBC, Wet Prep HPF POC: <10 (ref <10)
Yeast Wet Prep HPF POC: NONE SEEN

## 2023-05-01 LAB — LIPASE, BLOOD: Lipase: 26 U/L (ref 11–51)

## 2023-05-01 LAB — POC URINE PREG, ED: Preg Test, Ur: NEGATIVE

## 2023-05-01 LAB — CHLAMYDIA/NGC RT PCR (ARMC ONLY)
Chlamydia Tr: NOT DETECTED
N gonorrhoeae: NOT DETECTED

## 2023-05-01 MED ORDER — MORPHINE SULFATE (PF) 4 MG/ML IV SOLN
4.0000 mg | Freq: Once | INTRAVENOUS | Status: AC
  Administered 2023-05-01: 4 mg via INTRAVENOUS
  Filled 2023-05-01: qty 1

## 2023-05-01 MED ORDER — OXYCODONE HCL 5 MG PO TABS
5.0000 mg | ORAL_TABLET | Freq: Four times a day (QID) | ORAL | 0 refills | Status: DC | PRN
Start: 2023-05-01 — End: 2023-05-27

## 2023-05-01 MED ORDER — ONDANSETRON HCL 4 MG/2ML IJ SOLN
4.0000 mg | Freq: Once | INTRAMUSCULAR | Status: AC
  Administered 2023-05-01: 4 mg via INTRAVENOUS
  Filled 2023-05-01: qty 2

## 2023-05-01 MED ORDER — POTASSIUM CHLORIDE CRYS ER 20 MEQ PO TBCR
60.0000 meq | EXTENDED_RELEASE_TABLET | Freq: Once | ORAL | Status: AC
  Administered 2023-05-01: 60 meq via ORAL
  Filled 2023-05-01: qty 3

## 2023-05-01 NOTE — ED Notes (Signed)
Patient transported to Ultrasound 

## 2023-05-01 NOTE — ED Provider Notes (Signed)
[facility] Provider Note    Event Date/Time   First MD Initiated Contact with Patient 05/01/23 [phone]     (approximate)   History   Chief Complaint: Abdominal Pain   HPI  Marie Moran is a 42 y.o. female with a history of sarcoidosis, GERD, ovarian cyst who comes to the ED complaining of severe abdominal pain that woke her up at about 4:30 AM this morning.  She went to the bathroom, urinated, had a bowel movement, without any improvement in the pain.  She took 600 mg of ibuprofen, and pain has improved from 10/10 to 7/10 currently.  Denies fever.  She did vomit 1 time.  Also reports some vaginal bleeding this morning without discharge.  No dysuria     Physical Exam   Triage Vital Signs: ED Triage Vitals  Encounter Vitals Group     BP 05/01/23 0612 (!) 143/92     Systolic BP Percentile --      Diastolic BP Percentile --      Pulse Rate 05/01/23 0612 87     Resp 05/01/23 0612 18     Temp 05/01/23 0612 97.7 F (36.5 C)     Temp src --      SpO2 05/01/23 0612 100 %     Weight 05/01/23 0613 175 lb (79.4 kg)     Height 05/01/23 0613 5\' 8"  (1.727 m)     Head Circumference --      Peak Flow --      Pain Score 05/01/23 0613 8     Pain Loc --      Pain Education --      Exclude from Growth Chart --     Most recent vital signs: Vitals:   05/01/23 0612 05/01/23 1029  BP: (!) 143/92 (!) 143/100  Pulse: 87 75  Resp: 18 17  Temp: 97.7 F (36.5 C) 98.3 F (36.8 C)  SpO2: 100% 98%    General: Awake, no distress.  CV:  Good peripheral perfusion.  Resp:  Normal effort.  Abd:  No distention.  Soft with suprapubic and left lower quadrant tenderness Other:  Moist oral mucosa. Pelvic exam performed with nurse Marshevet at bedside.  Normal external exam.  Small amount of old blood in the vault.  Positive CMT.  Bilateral adnexal tenderness without obvious mass.  No purulence.   ED Results / Procedures / Treatments   Labs (all labs ordered are  listed, but only abnormal results are displayed) Labs Reviewed  CBC WITH DIFFERENTIAL/PLATELET - Abnormal; Notable for the following components:      Result Value   RBC 5.12 (*)    All other components within normal limits  COMPREHENSIVE METABOLIC PANEL - Abnormal; Notable for the following components:   Sodium 132 (*)    Potassium 2.5 (*)    Calcium 8.7 (*)    AST 14 (*)    All other components within normal limits  URINALYSIS, ROUTINE W REFLEX MICROSCOPIC - Abnormal; Notable for the following components:   Color, Urine YELLOW (*)    APPearance HAZY (*)    Specific Gravity, Urine 1.033 (*)    Hgb urine dipstick LARGE (*)    Ketones, ur 5 (*)    Protein, ur 30 (*)    All other components within normal limits  POC URINE PREG, ED - Normal  CHLAMYDIA/NGC RT PCR ([facility] ONLY)            WET PREP, GENITAL  LIPASE,  BLOOD     EKG    RADIOLOGY Ultrasound interpreted by me, reveals several uterine fibroids.  No adnexal mass.  Radiology report reviewed   PROCEDURES:  Procedures   MEDICATIONS ORDERED IN ED: Medications  morphine (PF) 4 MG/ML injection 4 mg (4 mg Intravenous Given 05/01/23 0744)  ondansetron (ZOFRAN) injection 4 mg (4 mg Intravenous Given 05/01/23 0743)  potassium chloride SA (KLOR-CON M) CR tablet 60 mEq (60 mEq Oral Given 05/01/23 0918)  morphine (PF) 4 MG/ML injection 4 mg (4 mg Intravenous Given 05/01/23 1238)     IMPRESSION / MDM / ASSESSMENT AND PLAN / ED COURSE  I reviewed the triage vital signs and the nursing notes.  DDx: STI, TOA, ovarian cyst, ovarian torsion, pregnancy, diverticulitis, cystitis, ureterolithiasis  Patient's presentation is most consistent with acute presentation with potential threat to life or bodily function.  Patient presents with severe left lower quadrant and pelvic pain.  Vital signs unremarkable.  Will check labs, speculum exam, pelvic ultrasound.   ----------------------------------------- 1:19 PM on  05/01/2023 ----------------------------------------- Workup reassuring, reveals uterine fibroids which are symptomatic.  Infection workup negative.  Stable for discharge, follow-up with her gynecologist at [facility].      FINAL CLINICAL IMPRESSION(S) / ED DIAGNOSES   Final diagnoses:  Uterine leiomyoma, unspecified location     Rx / DC Orders   ED Discharge Orders     None        Note:  This document was prepared using Dragon voice recognition software and may include unintentional dictation errors.   Sharman Cheek, MD 05/01/23 1320

## 2023-05-01 NOTE — ED Triage Notes (Addendum)
Patient ambulatory to triage with complaints of extreme abdominal pain that woke her up out of her sleep approx 1.5 hrs ago. Patient states she took a laxative last night prior to going to sleep due to irregular bowel movements normally and had a fairly normal bowel movement this morning when she woke up with pain. Pain did not lessen or subside after BM.

## 2023-05-01 NOTE — Telephone Encounter (Signed)
Spoke with pt. Gyn u/s shows leio, stable compared to 2021 GYN u/s. Awoke with severe sharp, stabbing pelvic pains this AM, then started having bleeding. Went to ED, dx with leio. Given pain meds and sx improving. Bleeding has stopped. Pt on OCPs, has menses Q1-2 months,light flow, mild dysmen; has done well with OCPs for cycle control.  Will follow sx for now. Discussed myfembree, UFE, hyst if sx persist/worsen. Pt to f/u prn.

## 2023-05-03 NOTE — Plan of Care (Signed)
[facility] Tonsillectomy/Adenoidectomy, Postoperative PEDS care plan entered in error.

## 2023-05-07 MED FILL — WEGOVY 1.7 MG/0.75 ML SUBCUTANEOUS PEN INJECTOR: SUBCUTANEOUS | 28 days supply | Qty: 3 | Fill #2

## 2023-05-07 NOTE — Unmapped (Signed)
Peggy Rojas requested a refill of their [facility] via IVR/Web. The [facility] has scheduled delivery per the patients request via Same Day Courier to be delivered to their prescription address on 05/07/23.

## 2023-05-08 ENCOUNTER — Telehealth: Payer: Self-pay | Admitting: Obstetrics and Gynecology

## 2023-05-08 NOTE — Telephone Encounter (Signed)
Ok. I spoke to pt a few days ago as well.

## 2023-05-08 NOTE — Telephone Encounter (Signed)
Spoke with patient in connection with ER follow up. Patient states that her pain level is currently a zero. Patient states bleeding has lightened and is not as heavy. Pt states she is still bleeding sporadically and has not had a day that she has not bled since last Thursday. Pt states its normally bright red but has been dark in color the past few days. Patient aware to contact the office with any concerns or if appointment is needed.

## 2023-05-08 NOTE — Unmapped (Signed)
[facility] Delivery Pharm[facility] Refill Coordination Note    Peggy Rojas, DOB: 06-26-1981  Phone: [phone] (home)       All above HIPAA information was verified with patient.         05/07/2023    12:20 PM   Specialty Rx Medication Refill Questionnaire   Which Medications would you like refilled and shipped? Wegovy and Hadlima   Please list all current allergies: None   Have you missed any doses in the last 30 days? No   Have you had any changes to your medication(s) since your last refill? No   How many days remaining of each medication do you have at home? 1   If receiving an injectable medication, next injection date is 05/08/2023   Have you experienced any side effects in the last 30 days? No   Please enter the full address (street address, city, state, zip code) where you would like your medication(s) to be delivered to. [address] [name] , [place] [address]   Please specify on which day you would like your medication(s) to arrive. Note: if you need your medication(s) within 3 days, please call the pharmacy to schedule your order at [phone]  05/13/2023   Has your insurance changed since your last refill? No   Would you like a pharmacist to call you to discuss your medication(s)? No   Do you require a signature for your package? (Note: if we are billing Medicare Part B or your order contains a controlled substance, we will require a signature) No         Completed refill call assessment today to schedule patient's medication shipment from the [facility] [phone]).  All relevant notes have been reviewed.       Confirmed patient received a Conservation officer, historic buildings and a Surveyor, mining with first shipment. The patient will receive a drug information handout for each medication shipped and additional FDA Medication Guides as required.         REFERRAL TO PHARMACIST     Referral to the pharmacist: Not needed      [facility]     Shipping address confirmed in Epic.     Delivery Scheduled: Yes, Expected medication delivery date: 10/29.     Medication will be delivered via Same Day Courier to the prescription address in Epic WAM.    [facility] Pharmacy Specialty Technician

## 2023-05-13 MED FILL — HADLIMA(CF) PUSHTOUCH 40 MG/0.4 ML SUBCUTANEOUS AUTO-INJECTOR: SUBCUTANEOUS | 28 days supply | Qty: 0.8 | Fill #5

## 2023-05-27 ENCOUNTER — Ambulatory Visit (INDEPENDENT_AMBULATORY_CARE_PROVIDER_SITE_OTHER): Payer: BC Managed Care – PPO | Admitting: Obstetrics and Gynecology

## 2023-05-27 ENCOUNTER — Encounter: Payer: Self-pay | Admitting: Obstetrics and Gynecology

## 2023-05-27 VITALS — BP 122/74 | Ht 68.0 in | Wt 177.0 lb

## 2023-05-27 DIAGNOSIS — D219 Benign neoplasm of connective and other soft tissue, unspecified: Secondary | ICD-10-CM

## 2023-05-27 DIAGNOSIS — D259 Leiomyoma of uterus, unspecified: Secondary | ICD-10-CM

## 2023-05-27 DIAGNOSIS — R102 Pelvic and perineal pain: Secondary | ICD-10-CM

## 2023-05-27 MED ORDER — IBUPROFEN 800 MG PO TABS
800.0000 mg | ORAL_TABLET | Freq: Three times a day (TID) | ORAL | 1 refills | Status: AC | PRN
Start: 2023-05-27 — End: ?

## 2023-05-27 MED ORDER — NORETHINDRONE ACETATE 5 MG PO TABS
5.0000 mg | ORAL_TABLET | Freq: Every day | ORAL | 0 refills | Status: DC
Start: 2023-05-27 — End: 2023-10-02

## 2023-05-27 NOTE — Patient Instructions (Signed)
I value your feedback and you entrusting us with your care. If you get a [facility] patient survey, I would appreciate you taking the time to let us know about your experience today. Thank you! ? ? ?

## 2023-05-27 NOTE — Progress Notes (Signed)
Marie Lat, [name]   Chief Complaint  Patient presents with   Follow-up    Irregular bleeding, pelvic pain started in October     HPI:      Marie Moran is a 43 y.o. G2P0020 whose LMP was Patient's last menstrual period was 05/23/2023 (approximate)., presents today for leio/pelvic pain f/u. Prior to 10/24, menses were regular every Q1-2 months now, lasting 3-4 days with OCPs, light flow. Dysmenorrhea mild, occurring first 1-2 days of flow, relieved with NSAIDs, no BTB. Hx of leio.  In 10/24, pt awoke with severe sharp, stabbing pelvic pain and bleeding and went to ED. GYN u/s showed leio that were stable in size when compared to 2021 GYN u/s but dont' know if pt has more now. Was given oxycodone and pain sx were improved. Pt was still taking OCPs with 10/24 sx. Pt states she had bleeding for 3 wks, heavier flow, with quarter sized clots (unusual for pt), stopped for a week, then had period on her placebo pills, lasting 7-8 days (stopped a few days ago), heavier flow than usual and severe dysmen. Still having pelvic pain even though bleeding has stopped. Taking ibup/tylenol dual action/using heating pad with minimal relief. Sex has been painful since 10/24 sx as well; wasn't the case prior to 10/24. Also having urinary frequency/urgency without other UTI sx.   05/01/23 GYN u/s with 3 largest leiomyomas which measured up to 2.7 x 2.8 x 3.0 cm, 2.0 x 2.4 x 2.7 cm and 2.4 x 3.0 x 3.2 cm. There several leiomyomas which has varying degrees of submucosal component. However, these are not well evaluated on this exam.  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 Nils Pyle  excisional   BREAST BIOPSY Bilateral 03/06/2015   Procedure: BILATERAL BREAST MASS EXCISION, right breast mass excision with ultrasound guided needle localization ;  Surgeon: Kieth Brightly, MD;  Location: [facility] OR[facility];  Service: General;  Laterality: Bilateral;   BREAST BIOPSY Right 2021   coil clip, [place] BX,  PASH stromal fibrosis   BREAST EXCISIONAL BIOPSY     COLONOSCOPY  2011   [facility]   DILATION AND CURETTAGE OF UTERUS  2007   DILATION AND CURETTAGE OF UTERUS N/A 07/05/2017   Procedure: DILATATION AND CURETTAGE;  Surgeon: Natale Milch, MD;  Location: [facility] OR[facility];  Service: Gynecology;  Laterality: N/A;   mass removed  2002   TONSILLECTOMY     UPPER GI ENDOSCOPY  2007   WISDOM TOOTH EXTRACTION  2006    Family History  Problem Relation Age of Onset   Heart disease Maternal Grandmother    Hypertension Maternal Grandmother    Pancreatic cancer Maternal Grandfather 39   Pancreatic cancer Cousin        51s   Breast cancer Other 69    Social History   Socioeconomic History   Marital status: Married    Spouse name: Not on file   Number of children: Not on  file   Years of education: Not on file   Highest education level: Not on file  Occupational History   Not on file  Tobacco Use   Smoking status: Never   Smokeless tobacco: Never  Vaping Use   Vaping status: Never Used  Substance and Sexual Activity   Alcohol use: Yes    Alcohol/week: 2.0 standard drinks of alcohol    Types: 2 Glasses of wine per week    Comment: Occasionally   Drug use: No   Sexual activity: Yes    Birth control/protection: Pill  Other Topics Concern   Not on file  Social History Narrative   Not on file   Social Determinants of Health   Financial Resource Strain: Not on  file  Food Insecurity: Not on file  Transportation Needs: Not on file  Physical Activity: Not on file  Stress: Not on file  Social Connections: Not on file  Intimate Partner Violence: Not on file    Outpatient Medications Prior to Visit  Medication Sig Dispense Refill   Adalimumab-bwwd (HADLIMA PUSHTOUCH) 40 MG/0.4ML SOAJ Inject into the skin.     chlorthalidone (HYGROTON) 25 MG tablet Take 25 mg by mouth every morning.     metFORMIN (GLUCOPHAGE-XR) 500 MG 24 hr tablet Take 500 mg by mouth 2 (two) times daily.     Multiple Vitamin (MULTI-VITAMIN) tablet Take 1 tablet by mouth daily.     Semaglutide-Weight Management (WEGOVY) 1.7 MG/0.75ML SOAJ Inject into the skin.     Transport planner (GUARDIAN SHARPS COLLECTOR) MISC Use as directed to dispose of injectable medications     ibuprofen (ADVIL,MOTRIN) 600 MG tablet Take by mouth.     Norethindrone Acetate-Ethinyl Estrad-FE (LARIN 24 FE) 1-20 MG-MCG(24) tablet Take 1 tablet by mouth daily. 84 tablet 3   oxyCODONE (ROXICODONE) 5 MG immediate release tablet Take 1 tablet (5 mg total) by mouth every 6 (six) hours as needed for breakthrough pain. 10 tablet 0   Semaglutide-Weight Management (WEGOVY) 1 MG/0.5ML SOAJ Inject into the skin.     No facility-administered medications prior to visit.      ROS:  Review of Systems  Constitutional:  Negative for fever.  Gastrointestinal:  Negative for blood in stool, constipation, diarrhea, nausea and vomiting.  Genitourinary:  Positive for frequency, menstrual problem, pelvic pain and urgency. Negative for dyspareunia, dysuria, flank pain, hematuria, vaginal bleeding, vaginal discharge and vaginal pain.  Musculoskeletal:  Negative for back pain.  Skin:  Negative for rash.   BREAST: No symptoms   OBJECTIVE:   Vitals:  BP 122/74   Ht 5\' 8"  (1.727 m)   Wt 177 lb (80.3 kg)   LMP 05/23/2023 (Approximate)   BMI 26.91 kg/m   Physical Exam Vitals reviewed.  Constitutional:      Appearance:  She is well-developed.  Pulmonary:     Effort: Pulmonary effort is normal.  Genitourinary:    General: Normal vulva.     Pubic Area: No rash.      Labia:        Right: No rash, tenderness or lesion.        Left: No rash, tenderness or lesion.      Vagina: Normal. No vaginal discharge, erythema or tenderness.     Cervix: Normal.     Uterus: Normal. Enlarged and tender.      Adnexa: Right adnexa normal and left adnexa normal.       Right: No mass or tenderness.  Left: No mass or tenderness.    Musculoskeletal:        General: Normal range of motion.     Cervical back: Normal range of motion.  Skin:    General: Skin is warm and dry.  Neurological:     General: No focal deficit present.     Mental Status: She is alert and oriented to person, place, and time.  Psychiatric:        Mood and Affect: Mood normal.        Behavior: Behavior normal.        Thought Content: Thought content normal.        Judgment: Judgment normal.     Assessment/Plan: Leiomyoma - Plan: Ambulatory referral to Vascular Surgery, norethindrone (AYGESTIN) 5 MG tablet, ibuprofen (ADVIL) 800 MG tablet; stable leio on GYN u/s 2024 compared to 2021 but pt sx now. Discussed tx with hormones, UFE, myomectomy. Will change to Rx aygestin for now and refer to [facility] for UFE consult. Rx ibup 800 mg eRxd for pain since advil dual not helping (mostly tylenol and not ibup). F/u prn.   Pelvic pain - Plan: Ambulatory referral to Vascular Surgery, norethindrone (AYGESTIN) 5 MG tablet, ibuprofen (ADVIL) 800 MG tablet    Meds ordered this encounter  Medications   norethindrone (AYGESTIN) 5 MG tablet    Sig: Take 1 tablet (5 mg total) by mouth daily.    Dispense:  90 tablet    Refill:  0    Order Specific Question:   Supervising Provider    Answer:   Hildred Laser [[id number]]   ibuprofen (ADVIL) 800 MG tablet    Sig: Take 1 tablet (800 mg total) by mouth every 8 (eight) hours as needed.    Dispense:  30  tablet    Refill:  1    Order Specific Question:   Supervising Provider    Answer:   Hildred Laser [[id number]]      Return if symptoms worsen or fail to improve.  Marie Mcdade B. Antonya Leeder, [name] 05/27/2023 12:06 PM

## 2023-06-02 NOTE — Unmapped (Signed)
[facility] Clinical Assessment & Refill Coordination Note    Peggy Rojas, DOB: September 28, 1980  Phone: [phone] (home)     All above HIPAA information was verified with patient.     Was a Nurse, learning disability used for this call? No    Specialty Medication(s):   Inflammatory Disorders: Hadlima     Current Outpatient Medications   Medication Sig Dispense Refill    adalimumab-bwwd (HADLIMA,CF, PUSHTOUCH) 40 mg/0.4 mL AtIn Inject the contents of 1 pen (40 mg total) under the skin every fourteen (14) days. 2.4 mL 5    adalimumab-bwwd (HADLIMA,CF, PUSHTOUCH) 40 mg/0.4 mL AtIn Inject the contents of 1 pen (40 mg total) subcutaneously every 14 (fourteen) days 2 mL 5    adalimumab-bwwd (HADLIMA,CF, PUSHTOUCH) 40 mg/0.4 mL AtIn Inject the contents of 1 pen (40 mg) under the skin every fourteen (14) days. 0.8 mL 12    adalimumab-bwwd (HADLIMA,CF, PUSHTOUCH) 40 mg/0.4 mL AtIn Inject the contents of 1 auto-injector (40 mg) under the skin every fourteen (14) days. 0.8 mL 5    chlorthalidone (HYGROTON) 25 MG tablet TAKE 1 TABLET BY MOUTH ONCE DAILY IN THE MORNING 90 tablet 0    empty container Misc Use as directed to dispose of injectable medications 1 each 3    empty container Misc use as directed 1 each 2    ibuprofen (MOTRIN) 600 MG tablet Take by mouth every hour as needed.      LARIN 24 FE 1 mg-20 mcg (24)/75 mg (4) Tab Take 1 tablet by mouth daily.      metFORMIN (GLUCOPHAGE-XR) 500 MG 24 hr tablet Take 1 tablet (500 mg total) by mouth in the morning and 1 tablet (500 mg total) in the evening. Take with meals. 180 tablet 2    WEGOVY 1.7 MG/0.75 ML SUBCUTANEOUS PEN INJECTOR Inject 0.75 mL (1.7 mg total) under the skin every seven (7) days. 3 mL 3     No current facility-administered medications for this visit.        Changes to medications: Methyl reports no changes at this time.    No Known Allergies    Changes to allergies: No    SPECIALTY MEDICATION ADHERENCE              Specialty medication(s) dose(s) confirmed: Regimen is correct and unchanged.     Are there any concerns with adherence? No    Adherence counseling provided? Not needed    CLINICAL MANAGEMENT AND INTERVENTION      Clinical Benefit Assessment:    Do you feel the medicine is effective or helping your condition? Yes    Clinical Benefit counseling provided? Not needed    Adverse Effects Assessment:    Are you experiencing any side effects? No    Are you experiencing difficulty administering your medicine? No    Quality of Life Assessment:    Quality of Life    Rheumatology  Oncology  Dermatology  Cystic Fibrosis          How many days over the past month did your condition  keep you from your normal activities? For example, brushing your teeth or getting up in the morning. 0    Have you discussed this with your provider? Not needed    Acute Infection Status:    Acute infections noted within Epic:  No active infections  Patient reported infection: None    Therapy Appropriateness:    Is therapy appropriate based on current medication list,  adverse reactions, adherence, clinical benefit and progress toward achieving therapeutic goals? Yes, therapy is appropriate and should be continued     DISEASE/MEDICATION-SPECIFIC INFORMATION      For patients on injectable medications: Patient currently has 0 doses left.  Next injection is scheduled for 11/21.    Chronic Inflammatory Diseases: Have you experienced any flares in the last month? No    PATIENT SPECIFIC NEEDS     Does the patient have any physical, cognitive, or cultural barriers? No    Is the patient high risk? No    Did the patient require a clinical intervention? No    Does the patient require physician intervention or other additional services (i.e., nutrition, smoking cessation, social work)? No    SOCIAL DETERMINANTS OF HEALTH     At the [facility], we have learned that life circumstances - like trouble affording food, housing, utilities, or transportation can affect the health of many of our patients.   That is why we wanted to ask: are you currently experiencing any life circumstances that are negatively impacting your health and/or quality of life? Patient declined to answer    Social Drivers of Health     Food Insecurity: Not on file   Internet Connectivity: Not on file   Housing/Utilities: Not on file   Tobacco Use: Low Risk  (04/03/2023)    Received from [facility]    Patient History     Smoking Tobacco Use: Never     Smokeless Tobacco Use: Never     Passive Exposure: Never   Transportation Needs: Not on file   Alcohol Use: Not on file   Interpersonal Safety: Not on file   Physical Activity: Not on file   Intimate Partner Violence: Not on file   Stress: Not on file   Substance Use: Not on file (05/24/2023)   Social Connections: Not on file   Financial Resource Strain: Not on file   Depression: Not on file   Health Literacy: Not on file       Would you be willing to receive help with any of the needs that you have identified today? Not applicable       SHIPPING     Specialty Medication(s) to be Shipped:   Inflammatory Disorders: Hadlima    Other medication(s) to be shipped:  Wegovy     Changes to insurance: No    Delivery Scheduled: Yes, Expected medication delivery date: 11/20.     Medication will be delivered via Same Day Courier to the confirmed prescription address in [facility].    The patient will receive a drug information handout for each medication shipped and additional FDA Medication Guides as required.  Verified that patient has previously received a Conservation officer, historic buildings and a Surveyor, mining.    The patient or caregiver noted above participated in the development of this care plan and knows that they can request review of or adjustments to the care plan at any time.      All of the patient's questions and concerns have been addressed.    Julianne Rice, PharmD   [facility] Specialty Pharmacist

## 2023-06-04 MED FILL — WEGOVY 1.7 MG/0.75 ML SUBCUTANEOUS PEN INJECTOR: SUBCUTANEOUS | 28 days supply | Qty: 3 | Fill #3

## 2023-06-04 MED FILL — HADLIMA(CF) PUSHTOUCH 40 MG/0.4 ML SUBCUTANEOUS AUTO-INJECTOR: SUBCUTANEOUS | 28 days supply | Qty: 0.8 | Fill #0

## 2023-06-19 ENCOUNTER — Ambulatory Visit: Admit: 2023-06-19 | Discharge: 2023-06-20 | Payer: BLUE CROSS/BLUE SHIELD

## 2023-06-19 DIAGNOSIS — D219 Benign neoplasm of connective and other soft tissue, unspecified: Principal | ICD-10-CM

## 2023-06-19 NOTE — Unmapped (Signed)
Patient Name: Peggy Rojas  Patient Age: 42 y.o.  Encounter Date: 06/19/23   Attending Interventional Radiologist: Dr. Seward Speck  Resident Interventional Radiologist: Antony Contras, MD    Referring Physician: Kizzie Bane, PA  [address]  [address]  [facility]  [place],  [place] [address]  Primary Care Provider: Maye Hides, MD      SUBJECTIVE      Reason for Visit: Uterine Fibroid Embolization       History of Present Illness: Peggy Rojas is a 42 y.o. female who is seen in consultation at the request of Kizzie Bane for evaluation of heavy uterine bleeding and bulk symptoms. Patient states that she had fibroids back in the 2020s and had them removed. She had been doing well until 2020/2021, when during a routine physical fibroids were identified on bedside ultrasound--however, she did not have any symptoms. Things changed in October 2024 when she woke up from sleep with heavy pelvis pain and bleeding and rushed to the ER and underwent an [place] which showed numerous fibroids. She has been on OCPs with little improvement in her symptoms. She does not desire future fertility and would like to avoid a hysterectomy. She presents to learn more about uterine artery embolization ([place]) for her fibroids.      Past Medical History:  Past Medical History:   Diagnosis Date    Essential hypertension 02/07/2022    Obesity [id number]       Past Surgical History:  Past Surgical History:   Procedure Laterality Date    BREAST SURGERY  2016       Family History:  No family history on file.     Allergies:  No Known Allergies     Anticoagulant/Antiplatelet Medications: No anticoagulant medications    OBJECTIVE     Physical Exam:  Vitals:    06/19/23 1051   BP: 151/84   Pulse: 80   Resp: 15   Temp: 36.8 ??C (98.2 ??F)   SpO2: 99%     Body mass index is 25.54 kg/m??.    Pertinent Laboratory Values:  Lab Results   Component Value Date    WBC 9.9 05/07/2012    HGB 13.3 05/07/2012    HCT 40.9 05/07/2012    PLT 375 05/07/2012     Lab Results   Component Value Date    BUN 11 02/07/2022     No results found for: PTINR, APTT    06/19/2023, labs were reviewed by me personally. Interpretation: Normal, and based on labs recommend New INR and CBC, within 30 days of procedure, per divisional guidelines..    Pertinent Imaging Studies: [place] from 05/01/2023, imaging was visualized and reviewed by me personally.    EXAM:   TRANSABDOMINAL AND TRANSVAGINAL ULTRASOUND OF PELVIS     DOPPLER ULTRASOUND OF OVARIES     TECHNIQUE:   Both transabdominal and transvaginal ultrasound examinations of the   pelvis were performed. Transabdominal technique was performed for   global imaging of the pelvis including uterus, ovaries, adnexal   regions, and pelvic cul-de-sac.     It was necessary to proceed with endovaginal exam following the   transabdominal exam to visualize the endometrium and ovaries. Color   and duplex Doppler ultrasound was utilized to evaluate blood flow to   the ovaries.     COMPARISON:  Ultrasound from 01/26/2020.     FINDINGS:   Uterus     Measurements: 6.0 x 6.3 x 9.9  cm = volume: 194.2 mL. Enlarged and   lobulated uterus secondary to multiple underlying focal lesions,   favored to represent leiomyomas. The technologist measured 3 largest   leiomyomas which measured up to 2.7 x 2.8 x 3.0 cm, 2.0 x 2.4 x 2.7   cm and 2.4 x 3.0 x 3.2 cm. There several leiomyomas which has   varying degrees of submucosal component. However, these are not well   evaluated on this exam.     Endometrium     Distorted by multiple leiomyomas and not diagnostically evaluated.     Right ovary     Measurements: 1.9 x 2.2 x 2.7 cm = volume: 3.2 mL. Only seen   transabdominally. Normal appearance/no adnexal mass.     Left ovary     Measurements: 1.5 x 1.5 x 2.3 cm = volume: 2.8 mL. Normal   appearance/no adnexal mass.     Pulsed Doppler evaluation of both ovaries demonstrates normal   low-resistance arterial and venous waveforms. Other findings     Heterogeneous appearance of the cervix, which contains at least   several nabothian cysts. There is mixed hyperechoic and anechoic   area within the cervical canal, which may represent mucous and or   hemorrhagic products.     IMPRESSION:   1. No evidence of ovarian torsion.   2. Enlarged and lobulated uterus secondary to multiple leiomyomas.   3. Endometrium is not diagnostically evaluated. Grossly unremarkable   bilateral ovaries.   4. Heterogeneous appearance of the cervix, which contains at least   several nabothian cysts. There is mixed hyperechoic and anechoic   area within the cervical canal, which may represent mucous and or   hemorrhagic products. Correlate clinically and with physical   examination to determine the need for additional imaging with   contrast-enhanced MRI pelvis     ASA Score: ASA 1 - Normal health patient    Performance Status: 0 = Fully active, able to carry on all pre-disease performance without restriction    ASSESSMENT/PLAN     [name] is a 42 y.o. female with symptomatic uterine fibroids.    I discussed the various treatment options available. At this time, we feel that she would benefit most from Uterine Artery Embolization The risks, benefits and alternatives were fully discussed including bleeding, infection, non-target embolization , hematoma , access site bleeding, post-operative pain , post embolization syndrome , fibroid expulsion , damage to adjacent structures/organs, and premature menopause. The patient's questions were all answered to her satisfaction.     At this point, she needs to undergo a contrast-enhanced pelvic MRI to determine for location of fibroids and presence of adenomyosis which will affect the procedural steps. Once her candidacy for [place] has been confirmed, she will be scheduled for the procedure as soon as possible.       -- Procedure will be scheduled at the earliest mutually available date & time after her MRI is complete. Procedure can be performed by Drs. Genevie Cheshire, and [place]. This was explained with the patient.   -- Anticoagulant medication hold required: No  -- Anticoagulation can be resumed: N/A  -- Antibiotics required: Yes- surgical prophylaxis    -- Discharge medications:Yes- UAE-Zofran 4 mg Q4H PRN x5 days for nausea,Percocet 5/325 Q4-6 hrs (take only when in moderate to severe pain), Naproxen 500 mg PO BID x7 days (take regardless of pain) for pain, Colace 100 mg QD x7 days for opioid induced constipation.   --Planned level of  sedation: Moderate sedation  --Planned access: left wrist (arterial), Positioning: supine  --Labs needed: Yes- per divisional guidelines, the patient needs new CBC and INR, 30 days before the procedure   --Pre-procedural medications required: Yes- UAE-1g IV Tylenol for pain, 10 mg IV decadron for anti-inflammation, Ancef (weight-based dosing) antibiotics for surgical prophylaxis, 4 mg IV Zofran for nausea.   --Pre-US needed: Yes- to assess left radial artery diameter (must be greater or equal to 2 mm for radial access).      Informed consent obtained:   Yes---This procedure has been fully reviewed with the patient/patient's authorized representative. The risks, benefits and alternatives have been explained, and the patient/patient's authorized representative has consented to the procedure.     Class 1 - Can visualize soft palate, fauces, uvula, and tonsillar pillars      Antony Contras, MD  06/19/23 11:24 AM

## 2023-06-22 ENCOUNTER — Ambulatory Visit: Admit: 2023-06-22 | Discharge: 2023-06-23 | Payer: BLUE CROSS/BLUE SHIELD

## 2023-06-22 MED ADMIN — glucagon injection 1 mg: 1 mg | INTRAVENOUS | @ 17:00:00 | Stop: 2023-06-22

## 2023-06-22 MED ADMIN — gadopiclenol (ELUCIREM,VUEWAY) injection 7.6 mL: 7.6 mL | INTRAVENOUS | @ 18:00:00 | Stop: 2023-06-22

## 2023-06-22 MED ADMIN — glucagon 1 mg/ml injection: INTRAVENOUS | @ 17:00:00 | Stop: 2023-06-22

## 2023-06-23 DIAGNOSIS — D219 Benign neoplasm of connective and other soft tissue, unspecified: Principal | ICD-10-CM

## 2023-06-24 DIAGNOSIS — E663 Overweight: Principal | ICD-10-CM

## 2023-06-24 MED ORDER — WEGOVY 1.7 MG/0.75 ML SUBCUTANEOUS PEN INJECTOR
SUBCUTANEOUS | 3 refills | 28.00 days | Status: CP
Start: 2023-06-24 — End: ?
  Filled 2023-07-04: qty 3, 28d supply, fill #0

## 2023-06-24 NOTE — Unmapped (Signed)
[facility] Refill Coordination Note    Peggy Rojas, DOB: February 10, 1981  Phone: [phone] (home)       All above HIPAA information was verified with patient.         06/24/2023     7:37 AM   Specialty Rx Medication Refill Questionnaire   Which Medications would you like refilled and shipped? Hadlima and wegovy   Please list all current allergies: None   Have you missed any doses in the last 30 days? No   Have you had any changes to your medication(s) since your last refill? No   How many days remaining of each medication do you have at home? 1   If receiving an injectable medication, next injection date is 07/03/2023   Have you experienced any side effects in the last 30 days? No   Please enter the full address (street address, city, state, zip code) where you would like your medication(s) to be delivered to. [address] [place] [address]   Please specify on which day you would like your medication(s) to arrive. Note: if you need your medication(s) within 3 days, please call the pharmacy to schedule your order at [phone]  07/04/2023   Has your insurance changed since your last refill? No   Would you like a pharmacist to call you to discuss your medication(s)? No   Do you require a signature for your package? (Note: if we are billing Medicare Part B or your order contains a controlled substance, we will require a signature) No         Completed refill call assessment today to schedule patient's medication shipment from the [facility] [phone]).  All relevant notes have been reviewed.       Confirmed patient received a Conservation officer, historic buildings and a Surveyor, mining with first shipment. The patient will receive a drug information handout for each medication shipped and additional FDA Medication Guides as required.         REFERRAL TO PHARMACIST     Referral to the pharmacist: Not needed      [facility]     Shipping address confirmed in Epic.     Delivery Scheduled: Yes, Expected medication delivery date: 12/20.  However, Rx request for refills was sent to the provider as there are none remaining.     Medication will be delivered via Same Day Courier to the prescription address in Epic [facility].    [facility] Specialty Technician

## 2023-07-03 NOTE — Unmapped (Signed)
 VIR pre procedure prep call completed. Reviewed to register at 0820 on ground floor of [facility] then proceed [facility].  Informed of no show/late cancellation policy. NPO guidelines reviewed. Pt OK to take sips of clear liquids with all AM meds.  Pt aware of need for driver >43 years of age able to stay throughout procedure and recovery. Made aware of visitation policy. Pt verbalized understanding. All questions answered.

## 2023-07-04 ENCOUNTER — Ambulatory Visit: Admit: 2023-07-04 | Discharge: 2023-07-04 | Payer: BLUE CROSS/BLUE SHIELD

## 2023-07-04 DIAGNOSIS — D219 Benign neoplasm of connective and other soft tissue, unspecified: Principal | ICD-10-CM

## 2023-07-04 LAB — PROTIME-INR
INR: 1.02
PROTIME: 11.6 s (ref 9.9–12.6)

## 2023-07-04 LAB — CBC
HEMATOCRIT: 40.2 % (ref 34.0–44.0)
HEMOGLOBIN: 13.2 g/dL (ref 11.3–14.9)
MEAN CORPUSCULAR HEMOGLOBIN CONC: 32.9 g/dL (ref 32.0–36.0)
MEAN CORPUSCULAR HEMOGLOBIN: 27.3 pg (ref 25.9–32.4)
MEAN CORPUSCULAR VOLUME: 83.1 fL (ref 77.6–95.7)
MEAN PLATELET VOLUME: 8 fL (ref 6.8–10.7)
PLATELET COUNT: 285 10*9/L (ref 150–450)
RED BLOOD CELL COUNT: 4.84 10*12/L (ref 3.95–5.13)
RED CELL DISTRIBUTION WIDTH: 15.7 % — ABNORMAL HIGH (ref 12.2–15.2)
WBC ADJUSTED: 5.3 10*9/L (ref 3.6–11.2)

## 2023-07-04 MED ORDER — DOCUSATE SODIUM 100 MG CAPSULE
ORAL_CAPSULE | Freq: Every day | ORAL | 0 refills | 7.00 days | Status: CP
Start: 2023-07-04 — End: 2023-07-11
  Filled 2023-07-04: qty 7, 7d supply, fill #0

## 2023-07-04 MED ORDER — METHYLPREDNISOLONE 4 MG TABLETS IN A DOSE PACK
0 refills | 0.00 days | Status: CP
Start: 2023-07-04 — End: ?
  Filled 2023-07-04: qty 21, 6d supply, fill #0

## 2023-07-04 MED ORDER — ONDANSETRON HCL 4 MG TABLET
ORAL_TABLET | ORAL | 0 refills | 3.00 days | Status: CP | PRN
Start: 2023-07-04 — End: 2023-08-03
  Filled 2023-07-04: qty 16, 30d supply, fill #0

## 2023-07-04 MED ORDER — OXYCODONE-ACETAMINOPHEN 5 MG-325 MG TABLET
ORAL_TABLET | ORAL | 0 refills | 5 days | Status: CP | PRN
Start: 2023-07-04 — End: 2023-07-09
  Filled 2023-07-04: qty 30, 5d supply, fill #0

## 2023-07-04 MED ORDER — NAPROXEN 500 MG TABLET
ORAL_TABLET | Freq: Two times a day (BID) | ORAL | 0 refills | 7.00 days | Status: CP
Start: 2023-07-04 — End: 2023-07-11
  Filled 2023-07-04: qty 14, 7d supply, fill #0

## 2023-07-04 MED ADMIN — ketorolac (TORADOL) injection 30 mg: 30 mg | INTRAVENOUS | @ 19:00:00 | Stop: 2023-07-04

## 2023-07-04 MED ADMIN — HYDROmorphone (PF) (DILAUDID) injection 0.5 mg: .5 mg | INTRAVENOUS | @ 19:00:00 | Stop: 2023-07-04

## 2023-07-04 MED ADMIN — ceFAZolin (ANCEF) IVPB 2 g in 50 ml dextrose (premix): 2 g | INTRAVENOUS | @ 17:00:00 | Stop: 2023-07-04

## 2023-07-04 MED ADMIN — midazolam (VERSED) injection: INTRAVENOUS | @ 18:00:00 | Stop: 2023-07-04

## 2023-07-04 MED ADMIN — HYDROmorphone (PF) (DILAUDID) injection 0.5 mg: .5 mg | INTRAVENOUS | @ 21:00:00 | Stop: 2023-07-04

## 2023-07-04 MED ADMIN — fentaNYL (PF) (SUBLIMAZE) injection: INTRAVENOUS | @ 17:00:00 | Stop: 2023-07-04

## 2023-07-04 MED ADMIN — lidocaine (LMX) 4 % cream: TOPICAL | @ 14:00:00 | Stop: 2023-07-04

## 2023-07-04 MED ADMIN — lidocaine (PF) (XYLOCAINE-MPF) 10 mg/mL (1 %) injection: INTRADERMAL | @ 17:00:00 | Stop: 2023-07-04

## 2023-07-04 MED ADMIN — fentaNYL (PF) (SUBLIMAZE) injection: INTRAVENOUS | @ 18:00:00 | Stop: 2023-07-04

## 2023-07-04 MED ADMIN — acetaminophen (OFIRMEV) 10 mg/mL injection 1,000 mg: 1000 mg | INTRAVENOUS | @ 16:00:00 | Stop: 2023-07-04

## 2023-07-04 MED ADMIN — midazolam (VERSED) injection 1 mg: 1 mg | INTRAVENOUS | @ 17:00:00 | Stop: 2023-07-04

## 2023-07-04 MED ADMIN — lidocaine (PF) (XYLOCAINE-MPF) 10 mg/mL (1 %) injection: INTRADERMAL | @ 18:00:00 | Stop: 2023-07-04

## 2023-07-04 MED ADMIN — fentaNYL (PF) (SUBLIMAZE) injection: INTRAVENOUS | @ 19:00:00 | Stop: 2023-07-04

## 2023-07-04 MED ADMIN — midazolam (VERSED) injection: INTRAVENOUS | @ 17:00:00 | Stop: 2023-07-04

## 2023-07-04 MED ADMIN — HYDROmorphone (PF) (DILAUDID) injection 1 mg: 1 mg | INTRAVENOUS | @ 22:00:00 | Stop: 2023-07-04

## 2023-07-04 MED ADMIN — dexAMETHasone (DECADRON) 4 mg/mL injection 10 mg: 10 mg | INTRAVENOUS | @ 14:00:00 | Stop: 2023-07-04

## 2023-07-04 MED ADMIN — iohexol (OMNIPAQUE) 300 mg iodine/mL solution 200 mL: 200 mL | @ 19:00:00 | Stop: 2023-07-04

## 2023-07-04 MED ADMIN — lidocaine (PF) (XYLOCAINE-MPF) 10 mg/mL (1 %) injection: @ 19:00:00 | Stop: 2023-07-04

## 2023-07-04 MED ADMIN — midazolam (VERSED) injection: INTRAVENOUS | @ 19:00:00 | Stop: 2023-07-04

## 2023-07-04 MED ADMIN — nitroglycerin (NITROSTAT) 2 % ointment 0.5 inch: .5 [in_us] | TOPICAL | @ 14:00:00 | Stop: 2023-07-04

## 2023-07-04 MED ADMIN — ondansetron (ZOFRAN) injection 4 mg: 4 mg | INTRAVENOUS | @ 21:00:00 | Stop: 2023-07-04

## 2023-07-04 MED ADMIN — ketorolac (TORADOL) injection 30 mg: 30 mg | INTRAVENOUS | @ 18:00:00 | Stop: 2023-07-04

## 2023-07-04 MED ADMIN — ondansetron (ZOFRAN) injection 4 mg: 4 mg | INTRAVENOUS | @ 14:00:00 | Stop: 2023-07-04

## 2023-07-04 MED ADMIN — oxyCODONE (ROXICODONE) immediate release tablet 5 mg: 5 mg | ORAL | @ 20:00:00 | Stop: 2023-07-04

## 2023-07-04 MED ADMIN — lidocaine (PF) (XYLOCAINE-MPF) 10 mg/mL (1 %) injection: @ 18:00:00 | Stop: 2023-07-04

## 2023-07-04 MED ADMIN — promethazine (PHENERGAN) injection 25 mg: 25 mg | INTRAVENOUS | @ 19:00:00 | Stop: 2023-07-04

## 2023-07-04 MED FILL — EMPTY CONTAINER: 120 days supply | Qty: 1 | Fill #1

## 2023-07-04 MED FILL — HADLIMA(CF) PUSHTOUCH 40 MG/0.4 ML SUBCUTANEOUS AUTO-INJECTOR: SUBCUTANEOUS | 28 days supply | Qty: 0.8 | Fill #1

## 2023-07-04 NOTE — Unmapped (Cosign Needed)
[place] [facility] - Operative Note     VIR Post-Procedure Note    Procedure Name: UFE    Pre-Op Diagnosis: symptomatic uterine fibroids    Post-Op Diagnosis: Same as pre-operative diagnosis    VIR Providers    Attending: Andrena Mews MD  Assistant: Renato Battles, MD, MD    Description of procedure: Successful bilateral [place]. SHNB was unsuccessful due to low venous confluence of the iliac veins precluding safe delivery of the block. Intra-arterial lidocaine was given bilaterally.     Attempted L radial access with tortuous radial artery and ultimately changed to L femoral artery access for procedure. Small hematoma at L radial access site. 5Fr sheath in L fem artery with Mynx device for closure. Good hemostasis.    2 hours flat with LLE straight.    Follow up with Dr. Andrena Mews in VIR clinic in ~6 weeks with MRI pelvis beforehand.    Estimated Blood Loss: approximately 20 mL  Complications: None    See detailed procedure note with images in PACS Corinne Ports).    The patient tolerated the procedure well without incident or complication and left the room in stable condition.    Renato Battles, MD, MD  07/04/2023 2:04 PM

## 2023-07-05 NOTE — Unmapped (Addendum)
Pain controlled as verbalized by patient. No nausea and vomiting prior discharge. Discharge instructions reviewed and given to pt and family. Understanding verbalized. PIV removed with tip intact. Home supplies given for dressing changes. Pt wheeled out to discharge lobby. Discharged from PRU in stable condition.

## 2023-07-12 ENCOUNTER — Ambulatory Visit: Admit: 2023-07-12 | Discharge: 2023-07-12 | Payer: BLUE CROSS/BLUE SHIELD

## 2023-07-24 NOTE — Unmapped (Signed)
[facility] Delivery Pharmacy Refill Coordination Note    Peggy Rojas, DOB: 1980/10/22  Phone: [phone] (home)       All above HIPAA information was verified with patient.         07/24/2023     7:42 AM   Specialty Rx Medication Refill Questionnaire   Which Medications would you like refilled and shipped? Hadlima and wegovy   Please list all current allergies: None   Have you missed any doses in the last 30 days? No   Have you had any changes to your medication(s) since your last refill? No   How many days remaining of each medication do you have at home? 1   If receiving an injectable medication, next injection date is 07/31/2023   Have you experienced any side effects in the last 30 days? No   Please enter the full address (street address, city, state, zip code) where you would like your medication(s) to be delivered to. [address] [place] , [place]  [place] [address]   Please specify on which day you would like your medication(s) to arrive. Note: if you need your medication(s) within 3 days, please call the pharmacy to schedule your order at [phone]  07/31/2023   Has your insurance changed since your last refill? No   Would you like a pharmacist to call you to discuss your medication(s)? No   Do you require a signature for your package? (Note: if we are billing Medicare Part B or your order contains a controlled substance, we will require a signature) No         Completed refill call assessment today to schedule patient's medication shipment from the [facility] [phone]).  All relevant notes have been reviewed.       Confirmed patient received a Conservation officer, historic buildings and a Surveyor, mining with first shipment. The patient will receive a drug information handout for each medication shipped and additional FDA Medication Guides as required.         REFERRAL TO PHARMACIST     Referral to the pharmacist: Not needed      [facility]     Shipping address confirmed in Epic.     Delivery Scheduled: Yes, Expected medication delivery date: 1/16.     Medication will be delivered via Same Day Courier to the prescription address in Epic WAM.    [facility] Specialty Technician

## 2023-07-27 NOTE — Unmapped (Addendum)
Previous A1C of 5.5%. Mild acanthosis nigricans on PE. Reviewed relevant medications and labs. Patient is following our Weight Management Clinic. See Obesity tab for medication changes.    Lab Results   Component Value Date    A1C 4.9 07/29/2023    A1C 5.5 02/07/2022

## 2023-07-27 NOTE — Unmapped (Signed)
[facility] Follow Up    Assessment/Plan:     Chief Complaint   Patient presents with    Weight Management     Problem List Items Addressed This Visit          Unprioritized    Overweight (BMI 25.0-29.9)    Peggy Rojas is a 43 y.o. 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     Comorbidities: HTN, insulin resistance, vitamin D def.   Barriers: nothing specific at this time.    Weight Summary:  Starting weight/BMI/WC/[place]: 257 lbs, BMI 40.82, WC 43, [place] 13 (02/07/2022)  Target weight/goal: No goal weight. Patient wants general improvement in health and appearance.  Today's weight/BMI: 76.8 kg (169 lb 6.4 oz), BMI 25.76 (07/29/2023)   % Body weight loss: 23.3%  Today's Waist Circumference: 31 inches (07/29/2023)    PCP is in [employer] through [employer] (private practice.)     Referred by: A friend who is also our patient.    GOALS: 1) Continue with healthy eating pattern and physical activity regimen!     I have reviewed the patient's medical history, lifestyle history and labs/tests.   My recommendations include the following:    Lifestyle Pattern Summary: See GOALS above.     Weight gain causing medication: None.    Medication: Patient is taking medication as directed. Patient is tolerating Metformin 500 mg BID with meals and Wegovy 1.7 mg injections every other week well with no s/e. Discussed s/e profile and decided to decrease to [facility] 1.0 mg weekly Injections. Patient verbally agreed.      CONTINUE: Metformin 500 mg BID with meals DECREASE: Wegovy 1.7 mg --> 1.0 mg weekly injections    Tried/Contraindicated:  - Phentermine: WL of 19 lbs, couldn't tolerate d/t headaches and insomnia    Sleep: Currently sleeping 7 hours without sleep aids. STOP-BANG negative (2/8).    Stress management: Patient reports having good stress management techniques such as reading.     Obesity Surgery: Patient is not eligible but would consider in the future if needed. Patient verbalized that if Reginal Lutes is not covered, she would like to seriously consider surgery. (Updated 02/07/2022)    MASLD/NAFLD Screening: (07/29/23)  Computed FIB-4 Calculation unavailable. One or more values for this score either were not found within the given timeframe or did not fit some other criterion.     Medical conditions:  Glaucoma: no  Seizures: no  Medullary thyroid cancer (personal or family history): no  Multiple Endocrine Neoplasia: no  Palpitations/Tachycardia: no  Chest Pain: no past history of MI.   Headaches/Migraines: no  Nephrolithiasis: no  H/o pancreatitis: no  GERD: no     Prior Surgeries:  Cholecystectomy: no  Hysterectomy: no     Birth Control Methods: OCP         Insulin resistance - Primary    Previous A1C of 4.9%. Mild acanthosis nigricans on PE. Reviewed relevant medications and labs. Patient is following our Weight Management Clinic. See Obesity tab for medication changes.    Lab Results   Component Value Date    A1C 4.9 07/29/2023    A1C 5.5 02/07/2022               Relevant Orders    POCT glycosylated hemoglobin (Hb A1C) (Completed)  Return in about 4 months (around 11/26/2023) for Weight clinic F/U one slot..    I have reviewed and addressed the patient???s adherence and response to prescribed medications. I have identified patient barriers to following the proposed medication and treatment plan, and have noted opportunities to optimize healthy behaviors. I have answered the patient???s questions to satisfaction and the patient voices understanding.    Time: Greater than 50% of this encounter was spent in direct consultation with the patient in evaluation and discussing all of the above. Duration of encounter: 20 minutes.     HPI:     Peggy Rojas is a 43 y.o. female who  has a past medical history of Essential hypertension (02/07/2022) and Obesity (81191478). who presents today for [facility] follow up.    Weight Management History  Wt Readings from Last 6 Encounters:   07/29/23 76.8 kg (169 lb 6.4 oz)   07/04/23 76.2 kg (168 lb)   06/19/23 76.2 kg (168 lb)   03/06/23 83.6 kg (184 lb 3.2 oz)   12/03/22 89.4 kg (197 lb)   09/03/22 96.5 kg (212 lb 12.8 oz)         02/07/2022     1:00 PM 05/02/2022     2:41 PM 07/02/2022    10:28 AM 09/03/2022     9:54 AM 12/03/2022     1:39 PM 03/06/2023    10:35 AM 07/29/2023    10:24 AM   Waist Circumference   Waist Circumference 43 inches 41.5 inches 38.5 inches 38 inches 37.5 inches 33 inches 31 inches           Update: Patient lost 15 lbs in 5 months.   Medication: Patient is taking medication as directed. Patient is tolerating Metformin 500 mg BID with meals and Wegovy 1.7 mg injections every other week well with no s/e.   Eating Pattern:  - Fast food/Restaurants/Takeout: 2-4x per week.   - Breakfast: eggs, bagels, greek yogurt, and fruit  - Lunch: salad, grilled chicken, fish, veggies, frozen meals, leftovers, and fruit  - Dinner: red meat, fish, frozen meals, and fruits  - Drinks: water without Crystal Lite and black coffee 1x/weekly  - Snacks: fruit, vegetables, granola bars, and cheese  Physical Activity:  walk and gym for 40 mins, 3-4x per week.   Barrier: None discussed.     Lab Results   Component Value Date    A1C 4.9 07/29/2023    GLU 87 02/07/2022    LDL 95 02/07/2022    HDL 56 02/07/2022    AST 23 02/07/2022    ALT 24 02/07/2022    PLT 285 07/04/2023    TSH 1.103 02/07/2022     Past Medical/Surgical History:     Past Medical History:   Diagnosis Date    Essential hypertension 02/07/2022    Obesity [id number]     Past Surgical History:   Procedure Laterality Date    BREAST SURGERY  2016    IR EMBOLIZATION ORGAN ISCHEMIA, TUMORS, INFAR  07/04/2023    IR EMBOLIZATION ORGAN ISCHEMIA, TUMORS, INFAR 07/04/2023 Alexander Bergeron, MD IMG VIR H&V [facility] Social History:     Social History     Socioeconomic History    Marital status: Married     Spouse name: None    Number of children: None    Years of education: None    Highest education level: None   Tobacco Use    Smoking status: Never  Smokeless tobacco: Never   Vaping Use    Vaping status: Never Used   Substance and Sexual Activity    Alcohol use: Yes     Alcohol/week: 2.0 standard drinks of alcohol     Types: 2 Glasses of wine per week    Drug use: Never    Sexual activity: Yes     Partners: Male     Birth control/protection: OCP       Family History:     History reviewed. No pertinent family history.    Allergies:     Patient has no known allergies.    Current Medications:     Current Outpatient Medications   Medication Sig Dispense Refill    adalimumab-bwwd (HADLIMA,CF, PUSHTOUCH) 40 mg/0.4 mL AtIn Inject the contents of 1 pen (40 mg total) under the skin every fourteen (14) days. 2.4 mL 5    adalimumab-bwwd (HADLIMA,CF, PUSHTOUCH) 40 mg/0.4 mL AtIn Inject the contents of 1 auto-injector (40 mg) under the skin every fourteen (14) days. 0.8 mL 5    chlorthalidone (HYGROTON) 25 MG tablet TAKE 1 TABLET BY MOUTH ONCE DAILY IN THE MORNING 90 tablet 0    empty container Misc Use as directed to dispose of injectable medications 1 each 3    empty container Misc use as directed 1 each 2    ibuprofen (MOTRIN) 600 MG tablet Take by mouth every hour as needed.      LARIN 24 FE 1 mg-20 mcg (24)/75 mg (4) Tab Take 1 tablet by mouth daily.      metFORMIN (GLUCOPHAGE-XR) 500 MG 24 hr tablet Take 1 tablet (500 mg total) by mouth in the morning and 1 tablet (500 mg total) in the evening. Take with meals. 180 tablet 2    methylPREDNISolone (MEDROL DOSEPACK) 4 mg tablet follow package directions 21 each 0    multivitamin (TAB-A-VITE/THERAGRAN) per tablet Take 1 tablet by mouth daily.      WEGOVY 1 MG/0.5 ML SUBCUTANEOUS PEN INJECTOR Inject 1 mg under the skin every seven (7) days. 4 each 2     No current facility-administered medications for this visit.       I have reviewed and (if needed) updated the patient's problem list, medications, allergies, past medical and surgical history, social and family history.    ROS:     A 12 point review of systems was negative except for pertinent items noted in the HPI     Vital Signs:     Body mass index is 25.76 kg/m??. Waist Circumference: 31 inches    Wt Readings from Last 3 Encounters:   07/29/23 76.8 kg (169 lb 6.4 oz)   07/04/23 76.2 kg (168 lb)   06/19/23 76.2 kg (168 lb)     Temp Readings from Last 3 Encounters:   07/04/23 36.4 ??C (97.5 ??F)   06/19/23 36.8 ??C (98.2 ??F)   03/06/23 36 ??C (96.8 ??F) (Temporal)     BP Readings from Last 3 Encounters:   07/29/23 146/94   07/04/23 160/105   06/19/23 151/84     Pulse Readings from Last 3 Encounters:   07/29/23 73   07/04/23 93   06/19/23 80        Physical Exam:     General: well appearing, in NAD, Body mass index is 25.76 kg/m??. Ambulatory without help.  Body fat distribution: General adiposity. No supraclavicular adiposity. No dorsal adiposity. Waist Circumference: 31 inches     Head: normocephalic atraumatic.  Eyes: PERRLA, EOMI,  Sclera WNL.   Neck: supple, no LAD, no thyromegaly.  CV: RRR no rubs or murmurs.  Lungs: clear bilaterally to auscultation. No wheezing.  Extremities: no clubbing, cyanosis, No edema.  Skin: no concerning lesions, rashes observed. No lipomas, mild acanthosis nigricans.  Neuro: Alert and oriented X 3.    Labs:     Office Visit on 07/29/2023   Component Date Value Ref Range Status    Hemoglobin A1C 07/29/2023 4.9  4.0 - 6.0 % Final    A1C LOT NBR 07/29/2023 807,104   Final    A1C STRIP EXP 07/29/2023 05/14/2025   Final   Admission on 07/04/2023, Discharged on 07/04/2023   Component Date Value Ref Range Status    WBC 07/04/2023 5.3  3.6 - 11.2 10*9/L Final    RBC 07/04/2023 4.84  3.95 - 5.13 10*12/L Final    HGB 07/04/2023 13.2  11.3 - 14.9 g/dL Final    HCT 30/86/5784 40.2  34.0 - 44.0 % Final    MCV 07/04/2023 83.1  77.6 - 95.7 fL Final    MCH 07/04/2023 27.3  25.9 - 32.4 pg Final    MCHC 07/04/2023 32.9  32.0 - 36.0 g/dL Final    RDW 69/62/9528 15.7 (H)  12.2 - 15.2 % Final    MPV 07/04/2023 8.0  6.8 - 10.7 fL Final    Platelet 07/04/2023 285  150 - 450 10*9/L Final    PT 07/04/2023 11.6  9.9 - 12.6 sec Final    INR 07/04/2023 1.02   Final       Follow-up:     Return in about 4 months (around 11/26/2023) for Weight clinic F/U one slot..    I attest that I, Donzetta Matters, personally documented this note for [facility], MD.      Donzetta Matters  07/29/2023     I attest that I have reviewed the note and that the components of the history of the present illness, the physical exam, and the assessment and plan documented were performed by me or were performed in my presence where I verified the documentation and performed (or re-performed) the exam and medical decision making.     Marshell Garfinkel, MD

## 2023-07-27 NOTE — Unmapped (Addendum)
Peggy Rojas is a 43 y.o. 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     Comorbidities: HTN, insulin resistance, vitamin D def.   Barriers: nothing specific at this time.    Weight Summary:  Starting weight/BMI/WC/[place]: 257 lbs, BMI 40.82, WC 43, [place] 13 (02/07/2022)  Target weight/goal: No goal weight. Patient wants general improvement in health and appearance.  Today's weight/BMI: 76.8 kg (169 lb 6.4 oz), BMI 25.76 (07/29/2023)   % Body weight loss: 23.3%  Today's Waist Circumference: 31 inches (07/29/2023)    PCP is in [employer] through [employer] (private practice.)     Referred by: A friend who is also our patient.    GOALS: 1) Continue with healthy eating pattern and physical activity regimen!     I have reviewed the patient's medical history, lifestyle history and labs/tests.   My recommendations include the following:    Lifestyle Pattern Summary: See GOALS above.     Weight gain causing medication: None.    Medication: Patient is taking medication as directed. Patient is tolerating Metformin 500 mg BID with meals and Wegovy 1.7 mg injections every other week well with no s/e. Discussed s/e profile and decided to decrease to [facility] 1.0 mg weekly Injections. Patient verbally agreed.      CONTINUE: Metformin 500 mg BID with meals DECREASE: Wegovy 1.7 mg --> 1.0 mg weekly injections    Tried/Contraindicated:  - Phentermine: WL of 19 lbs, couldn't tolerate d/t headaches and insomnia    Sleep: Currently sleeping 7 hours without sleep aids. STOP-BANG negative (2/8).    Stress management: Patient reports having good stress management techniques such as reading.     Obesity Surgery: Patient is not eligible but would consider in the future if needed. Patient verbalized that if Reginal Lutes is not covered, she would like to seriously consider surgery. (Updated 02/07/2022)    MASLD/NAFLD Screening: (07/29/23)  Computed FIB-4 Calculation unavailable. One or more values for this score either were not found within the given timeframe or did not fit some other criterion.     Medical conditions:  Glaucoma: no  Seizures: no  Medullary thyroid cancer (personal or family history): no  Multiple Endocrine Neoplasia: no  Palpitations/Tachycardia: no  Chest Pain: no past history of MI.   Headaches/Migraines: no  Nephrolithiasis: no  H/o pancreatitis: no  GERD: no     Prior Surgeries:  Cholecystectomy: no  Hysterectomy: no     Birth Control Methods: OCP

## 2023-07-29 ENCOUNTER — Ambulatory Visit
Admit: 2023-07-29 | Discharge: 2023-07-30 | Payer: BLUE CROSS/BLUE SHIELD | Attending: Family Medicine | Primary: Family Medicine

## 2023-07-29 DIAGNOSIS — E663 Overweight: Principal | ICD-10-CM

## 2023-07-29 DIAGNOSIS — E88819 Insulin resistance: Principal | ICD-10-CM

## 2023-07-29 MED ORDER — WEGOVY 1 MG/0.5 ML SUBCUTANEOUS PEN INJECTOR
SUBCUTANEOUS | 2 refills | 0.00 days | Status: CP
Start: 2023-07-29 — End: ?
  Filled 2023-09-24: qty 2, 28d supply, fill #0

## 2023-07-31 MED FILL — HADLIMA(CF) PUSHTOUCH 40 MG/0.4 ML SUBCUTANEOUS AUTO-INJECTOR: SUBCUTANEOUS | 28 days supply | Qty: 0.8 | Fill #2

## 2023-08-20 NOTE — Unmapped (Signed)
[facility] Home Delivery Pharmacy Refill Coordination Note    Peggy Rojas, DOB: [birth year]  Phone: [phone] (home)       All above HIPAA information was verified with patient.         08/20/2023     7:35 AM   Specialty Rx Medication Refill Questionnaire   Which Medications would you like refilled and shipped? Wegovy and hadlima   Please list all current allergies: None   Have you missed any doses in the last 30 days? No   Have you had any changes to your medication(s) since your last refill? No   How many days remaining of each medication do you have at home? 1   If receiving an injectable medication, next injection date is 08/16/2023   Have you experienced any side effects in the last 30 days? No   Please enter the full address (street address, city, state, zip code) where you would like your medication(s) to be delivered to. [address] [place] , [place] [address]   Please specify on which day you would like your medication(s) to arrive. Note: if you need your medication(s) within 3 days, please call the pharmacy to schedule your order at [phone]  08/25/2023   Has your insurance changed since your last refill? No   Would you like a pharmacist to call you to discuss your medication(s)? No   Do you require a signature for your package? (Note: if we are billing Medicare Part B or your order contains a controlled substance, we will require a signature) No         Completed refill call assessment today to schedule patient's medication shipment from the [facility] Pharm[facility] [phone]).  All relevant notes have been reviewed.       Confirmed patient received a Conservation officer, historic buildings and a Surveyor, mining with first shipment. The patient will receive a drug information handout for each medication shipped and additional FDA Medication Guides as required.         REFERRAL TO PHARMACIST     Referral to the pharmacist: Not needed      [facility]     Shipping address confirmed in Epic.     Delivery Scheduled: Yes, Expected medication delivery date: 2/10.     Medication will be delivered via Same Day Courier to the prescription address in Epic WAM.    [facility] Specialty Technician

## 2023-08-23 ENCOUNTER — Inpatient Hospital Stay: Admit: 2023-08-23 | Discharge: 2023-08-24 | Payer: BLUE CROSS/BLUE SHIELD

## 2023-08-23 MED ADMIN — gadopiclenol (ELUCIREM,VUEWAY) injection 7.5 mL: 7.5 mL | INTRAVENOUS | @ 15:00:00 | Stop: 2023-08-23

## 2023-08-23 MED ADMIN — glucagon injection 1 mg: 1 mg | INTRAVENOUS | @ 15:00:00 | Stop: 2023-08-23

## 2023-08-25 MED FILL — HADLIMA(CF) PUSHTOUCH 40 MG/0.4 ML SUBCUTANEOUS AUTO-INJECTOR: SUBCUTANEOUS | 28 days supply | Qty: 0.8 | Fill #3

## 2023-08-27 DIAGNOSIS — I10 Essential (primary) hypertension: Principal | ICD-10-CM

## 2023-08-27 MED ORDER — CHLORTHALIDONE 25 MG TABLET
ORAL_TABLET | Freq: Every morning | ORAL | 0 refills | 0.00 days
Start: 2023-08-27 — End: ?

## 2023-08-28 MED ORDER — CHLORTHALIDONE 25 MG TABLET
ORAL_TABLET | Freq: Every morning | ORAL | 0 refills | 90.00 days
Start: 2023-08-28 — End: ?

## 2023-08-28 NOTE — Unmapped (Signed)
Patient must contact PCP

## 2023-09-01 ENCOUNTER — Ambulatory Visit
Admit: 2023-09-01 | Discharge: 2023-09-02 | Payer: BLUE CROSS/BLUE SHIELD | Attending: Diagnostic Radiology | Primary: Diagnostic Radiology

## 2023-09-01 DIAGNOSIS — D219 Benign neoplasm of connective and other soft tissue, unspecified: Principal | ICD-10-CM

## 2023-09-01 NOTE — Unmapped (Signed)
 Patient Name: Peggy Rojas  Patient Age: 43 y.o.  Encounter Date: 09/01/23   Attending Interventional Radiologist: Dr. Seward Speck  Resident Interventional Radiologist: Anson Crofts, MD    Referring Physician: Renato Battles, MD  [address]  [place],  [place] [address]  Primary Care Provider: Maye Hides, MD      SUBJECTIVE      Reason for Visit: Uterine Fibroid Embolization on 12/20   Interval hx:   S/p [place] 12/20 , reports significant improvement of bleeding and cramping as well as abdominal pressure . She also reports improvement of bloating. She also has improvmeent of urinary frequency, and constipation Denies chest pain, vaginal discharge, fevers. She is very happy with the results.     History of Present Illness: Peggy Rojas is a 43 y.o. female who is seen in consultation at the request of Renato Battles for evaluation of heavy uterine bleeding and bulk symptoms. Patient states that she had fibroids back in the 2020s and had them removed. She had been doing well until 2020/2021, when during a routine physical fibroids were identified on bedside ultrasound--however, she did not have any symptoms. Things changed in October 2024 when she woke up from sleep with heavy pelvis pain and bleeding and rushed to the ER and underwent an [place] which showed numerous fibroids. She has been on OCPs with little improvement in her symptoms. She does not desire future fertility and would like to avoid a hysterectomy. She presents to learn more about uterine artery embolization ([place]) for her fibroids.      Past Medical History:  Past Medical History:   Diagnosis Date    Essential hypertension 02/07/2022    Obesity [id number]       Past Surgical History:  Past Surgical History:   Procedure Laterality Date    BREAST SURGERY  2016    IR EMBOLIZATION ORGAN ISCHEMIA, TUMORS, INFAR  07/04/2023    IR EMBOLIZATION ORGAN ISCHEMIA, TUMORS, INFAR 07/04/2023 Peggy Bergeron, MD IMG VIR [facility]&V [facility]       Family History:  No family history on file.     Allergies:  No Known Allergies     Anticoagulant/Antiplatelet Medications: No anticoagulant medications    OBJECTIVE     Physical Exam:  There were no vitals filed for this visit.    There is no height or weight on file to calculate BMI.    Pertinent Laboratory Values:  Lab Results   Component Value Date    WBC 5.3 07/04/2023    HGB 13.2 07/04/2023    HCT 40.2 07/04/2023    PLT 285 07/04/2023     Lab Results   Component Value Date    BUN 11 02/07/2022     No results found for: PTINR, APTT    09/01/2023, labs were reviewed by me personally. Interpretation: Normal, and based on labs recommend New INR and CBC, within 30 days of procedure, per divisional guidelines..    Pertinent Imaging Studies: [place] from 05/01/2023, imaging was visualized and reviewed by me personally.    EXAM: MRI post [place] 2/11    Impression      1.  Multi-fibroid uterus. Many of which appear to be decreased in size status-post interval bilateral uterine artery embolization (as detailed).   2.  Unremarkable ovaries.   3.  No secondary signs of malignancy in the pelvis.       ASA Score: ASA 1 - Normal health patient    Performance Status:  0 = Fully active, able to carry on all pre-disease performance without restriction    ASSESSMENT/PLAN     [name] is a 43 y.o. female with symptomatic uterine fibroids s/p [place], with significant imrpovement of sx , she is very happy with results and MRI confirms devascularization of fibroids with decrease in size. I explained to her potential complications are lowm but can include fibroid expulsion. At this point, we will have a clinic follow-up in 6 months.          Anson Crofts, MD  09/01/23 8:30 AM

## 2023-09-01 NOTE — Unmapped (Signed)
 Patient in clinic today for F/U with VIR provider Dr. Andrena Mews, MD following UFE on 07/04/23.  MRI study completed on 08/23/23.    Pre-Op Diagnosis: Symptomatic uterine fibroids       VSS. Alert and Oriented x4. Denies pain on initial assessment/exam by RN. Allergies and home medications reviewed and documented. RN provided education to patient. No further questions or concerns at this time. RN offered clinic number for patient contact. AVS offered.

## 2023-09-12 NOTE — Unmapped (Unsigned)
 Spoke with patient via telephone for clarification. Stated she would like a different medication in place of Wegovy.  Stated the $100 copay is too expensive.

## 2023-09-12 NOTE — Unmapped (Signed)
 Copied from CRM #[id number]. Topic: Access To Clinicians - Medication Refill  >> Sep 12, 2023  9:28 AM April T wrote:  The caller reports that they have previously requested refill from pharmacy, but have not received authorization yet.     The patient is requesting the following:     Medication(s) for refill: WEGOVY 1 MG/0.5 ML SUBCUTANEOUS PEN INJECTOR   Quantity for 1 month supply    Pharmacy name and address: [facility]    Please contact The patient by Cell Phone in regards to this request.    Coverage: yes, coverage is accurate on file.    Urgent callback turnaround time: within 24 business hours. (Caller Notified)    Urgent Reason: Almost or completely out of medication(s)

## 2023-09-15 NOTE — Unmapped (Signed)
 [facility] Special[facility] Delivery Pharm[facility] Refill Coordination Note    Specialty Medication(s) to be Shipped:   Inflammatory Disorders: Hadlima    Other medication(s) to be shipped: No additional medications requested for fill at this time     Peggy Rojas, DOB: July 24, 1980  Phone: [phone] (home)       All above HIPAA information was verified with patient.     Was a Nurse, learning disability used for this call? No    Completed refill call assessment today to schedule patient's medication shipment from the [facility]  [phone]).  All relevant notes have been reviewed.     Specialty medication(s) and dose(s) confirmed: Regimen is correct and unchanged.   Changes to medications: [name] reports no changes at this time.  Changes to insurance: No  New side effects reported not previously addressed with a pharmacist or physician: None reported  Questions for the pharmacist: No    Confirmed patient received a Conservation officer, historic buildings and a Surveyor, mining with first shipment. The patient will receive a drug information handout for each medication shipped and additional FDA Medication Guides as required.       DISEASE/MEDICATION-SPECIFIC INFORMATION        N/A    SPECIALTY MEDICATION ADHERENCE              Were doses missed due to medication being on hold? No    Hadlima (CF) Pushtouch 40mg /0.19mL  : 0 doses of medicine on hand     REFERRAL TO PHARMACIST     Referral to the pharmacist: Not needed      [facility]     Shipping address confirmed in Epic.       Delivery Scheduled: Yes, Expected medication delivery date: 09/19/2023.     Medication will be delivered via Same Day Courier to the prescription address in Epic WAM.    [name], PharmD   [facility] Special[facility] Delivery Pharm[facility]  Specialty Pharmacist

## 2023-09-15 NOTE — Unmapped (Signed)
 Copied from CRM #[id number]. Topic: Access To Clinicians - Req Clinic Call Back  >> Sep 15, 2023  4:14 PM Meghan P wrote:  Pt calling back Sydell Axon regarding the well app. Pt has additional questions regarding how to convert points to obtain a discount. The patient preferred contact: Cell Phone Urgent callback turnaround time: within 24 business hours. Programmer, systems Notified)

## 2023-09-15 NOTE — Unmapped (Signed)
 Call placed to the patient to make her aware of the Well app as well as possible dietician visits for a discount, patient verbalized agreement.

## 2023-09-16 NOTE — Unmapped (Signed)
 Returned call to the patient to answer questions about the wellness points for possible co-pay reduction on medication. Patient thanked clinic for the assistance.

## 2023-09-19 MED FILL — HADLIMA(CF) PUSHTOUCH 40 MG/0.4 ML SUBCUTANEOUS AUTO-INJECTOR: SUBCUTANEOUS | 28 days supply | Qty: 0.8 | Fill #4

## 2023-09-23 NOTE — Unmapped (Signed)
 [facility] Specialty and Home Delivery Pharmacy Refill Coordination Note    Specialty Lite Medication(s) to be Shipped:   Wegovy    Other medication(s) to be shipped: No additional medications requested for fill at this time     [name], DOB: 29-Feb-1981  Phone: [phone] (home)       All above HIPAA information was verified with patient.     Was a Nurse, learning disability used for this call? No    Changes to medications: Bralee reports no changes at this time.  Changes to insurance: No      REFERRAL TO PHARMACIST     Referral to the pharmacist: Not needed      [facility]     Shipping address confirmed in Epic.     Cost and Payment: Patient has a copay of $100. They are aware and have authorized the pharmacy to charge the credit card on file.    Delivery Scheduled: Yes, Expected medication delivery date: 03/12.     Medication will be delivered via Same Day Courier to the prescription address in Epic WAM.    Antonietta Barcelona   [facility] Special[facility] Delivery Pharm[facility] Specialty Technician

## 2023-09-30 NOTE — Progress Notes (Unsigned)
 PCP:  Debera Lat, [name]   No chief complaint on file.    HPI:      [name] is a 43 y.o. G1P0 who LMP was No LMP recorded., presents today for her annual examination.  Her menses are regular every Q1-2 months now, lasting 3-4 days with OCPs, light flow. Dysmenorrhea mild, occurring first 1-2 days of flow, relieved with NSAIDs. She does not have intermenstrual bleeding. Hx of leio. S/p [place] at [facility] 12/24, doing better; has had f/u imaging. Was on OCPS???  Sex activity: single partner, contraception-OCPs. No pain/bleeding. Last Pap: 10/01/22 Results were NILM/neg HPV DNA 09/10/21 Results were ASCUS/neg HPV DNA.  09/07/20 Results were LGSIL, neg HPV DNA.  07/28/19 was neg cells/neg HPV DNA. S/p  1/20 Neg cells with POS HPV DNA with colpo and neg bx 08/13/18 with Dr. Bonney Aid.  2018  pap no abnormalities /POS HPV DNA with neg Colpo and bx  Hx of STDs: HPV  Hx of ext HPV lesions, treated with TCA in past and last yr.   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 Evette Cristal 8/16.  There is a FH of breast cancer in her mat great great aunt, genetic testing not indicated. There is no FH of ovarian cancer. There is a FH pancreatic cancer in her MGF and pat cousin. The patient does do self-breast exams.   Tobacco use: The patient denies current or previous tobacco use. Alcohol use: none No drug use.  Exercise: moderately active  She does get adequate calcium and Vitamin D in her diet.  Labs with PCP  Past Medical History:  Diagnosis Date   Fibroadenoma of both breasts    GERD (gastroesophageal reflux disease)    Pre-diabetes    Sarcoidosis    2009   Vitamin D deficiency     Past Surgical History:  Procedure Laterality Date   BREAST BIOPSY Right neg   2002 Dr. Nils Pyle  excisional   BREAST BIOPSY Bilateral 03/06/2015   Procedure: BILATERAL BREAST MASS  EXCISION, right breast mass excision with ultrasound guided needle localization ;  Surgeon: Kieth Brightly, MD;  Location: [facility] ORS;  Service: General;  Laterality: Bilateral;   BREAST BIOPSY Right 2021   coil clip, [place] BX,  PASH stromal fibrosis   BREAST EXCISIONAL BIOPSY     COLONOSCOPY  2011   [facility]   DILATION AND CURETTAGE OF UTERUS  2007   DILATION AND CURETTAGE OF UTERUS N/A 07/05/2017   Procedure: DILATATION AND CURETTAGE;  Surgeon: Natale Milch, MD;  Location: [facility] ORS;  Service: Gynecology;  Laterality: N/A;   mass removed  2002   TONSILLECTOMY     UPPER GI ENDOSCOPY  2007   WISDOM TOOTH EXTRACTION  2006    Family History  Problem Relation Age of Onset   Heart disease Maternal Grandmother    Hypertension Maternal Grandmother    Pancreatic cancer Maternal Grandfather 5   Pancreatic cancer Cousin        42s   Breast cancer Other 52    Social History   Socioeconomic History   Marital status: Married    Spouse name: Not on file   Number of children: Not on file   Years of education: Not on file   Highest education level: Not on file  Occupational History   Not on file  Tobacco Use  Smoking status: Never   Smokeless tobacco: Never  Vaping Use   Vaping status: Never Used  Substance and Sexual Activity   Alcohol use: Yes    Alcohol/week: 2.0 standard drinks of alcohol    Types: 2 Glasses of wine per week    Comment: Occasionally   Drug use: No   Sexual activity: Yes    Birth control/protection: Pill  Other Topics Concern   Not on file  Social History Narrative   Not on file   Social Drivers of Health   Financial Resource Strain: Not on file  Food Insecurity: Not on file  Transportation Needs: Not on file  Physical Activity: Not on file  Stress: Not on file  Social Connections: Not on file  Intimate Partner Violence: Not on file    No outpatient medications have been marked as taking for the 10/02/23 encounter (Appointment)  with Kinsley Holderman, Ilona Sorrel, [name].     ROS:  Review of Systems  Constitutional:  Negative for fatigue, fever and unexpected weight change.  Respiratory:  Negative for cough, shortness of breath and wheezing.   Cardiovascular:  Negative for chest pain, palpitations and leg swelling.  Gastrointestinal:  Negative for blood in stool, constipation, diarrhea, nausea and vomiting.  Endocrine: Negative for cold intolerance, heat intolerance and polyuria.  Genitourinary:  Negative for dyspareunia, dysuria, flank pain, frequency, genital sores, hematuria, menstrual problem, pelvic pain, urgency, vaginal bleeding, vaginal discharge and vaginal pain.  Musculoskeletal:  Negative for back pain, joint swelling and myalgias.  Skin:  Negative for rash.  Neurological:  Negative for dizziness, syncope, light-headedness, numbness and headaches.  Hematological:  Negative for adenopathy.  Psychiatric/Behavioral:  Negative for agitation, confusion, sleep disturbance and suicidal ideas. The patient is not nervous/anxious.      Objective: There were no vitals taken for this visit.   Physical Exam Constitutional:      Appearance: She is well-developed.  Genitourinary:     Vulva normal.     Right Labia: No rash, tenderness or lesions.    Left Labia: lesions.     Left Labia: No tenderness or rash.    No vaginal discharge, erythema or tenderness.      Right Adnexa: not tender and no mass present.    Left Adnexa: not tender and no mass present.    No cervical motion tenderness, friability or polyp.     Uterus is not enlarged or tender.  Breasts:    Right: No mass, nipple discharge, skin change or tenderness.     Left: No mass, nipple discharge, skin change or tenderness.  Neck:     Thyroid: No thyromegaly.  Cardiovascular:     Rate and Rhythm: Normal rate and regular rhythm.     Heart sounds: Normal heart sounds. No murmur heard. Pulmonary:     Effort: Pulmonary effort is normal.     Breath sounds:  Normal breath sounds.  Abdominal:     Palpations: Abdomen is soft.     Tenderness: There is no abdominal tenderness. There is no guarding or rebound.  Musculoskeletal:        General: Normal range of motion.     Cervical back: Normal range of motion.  Lymphadenopathy:     Cervical: No cervical adenopathy.  Neurological:     General: No focal deficit present.     Mental Status: She is alert and oriented to person, place, and time.     Cranial Nerves: No cranial nerve deficit.  Skin:    General:  Skin is warm and dry.  Psychiatric:        Mood and Affect: Mood normal.        Behavior: Behavior normal.        Thought Content: Thought content normal.        Judgment: Judgment normal.  Vitals reviewed.     Assessment/Plan: Encounter for annual routine gynecological examination  Cervical cancer screening - Plan: Cytology - PAP  Screening for HPV (human papillomavirus) - Plan: Cytology - PAP  History of abnormal cervical Pap smear - Plan: Cytology - PAP; repeat pap today. Will f/u if abn  Encounter for surveillance of contraceptive pills - Plan: Norethindrone Acetate-Ethinyl Estrad-FE (LARIN 24 FE) 1-20 MG-MCG(24) tablet; OCP RF eRxd.   Encounter for screening mammogram for malignant neoplasm of breast - Plan: MM 3D SCREENING MAMMOGRAM BILATERAL BREAST; pt to schedule mammo  Genital wart--1 lesion treated with TCA, wash in 4-6 hrs. F/u prn.       No orders of the defined types were placed in this encounter.         GYN counsel breast self exam, adequate intake of calcium and vitamin D, diet and exercise     F/U  No follow-ups on file.  Mersedes Alber B. Kalanie Fewell, [name] 09/30/2023 3:55 PM

## 2023-10-02 ENCOUNTER — Other Ambulatory Visit (HOSPITAL_COMMUNITY)
Admission: RE | Admit: 2023-10-02 | Discharge: 2023-10-02 | Disposition: A | Discharge status: Home / Self Care | Source: Ambulatory Visit | Attending: Obstetrics and Gynecology | Admitting: Obstetrics and Gynecology

## 2023-10-02 ENCOUNTER — Encounter: Payer: Self-pay | Admitting: Obstetrics and Gynecology

## 2023-10-02 ENCOUNTER — Ambulatory Visit: Payer: BC Managed Care – PPO | Admitting: Obstetrics and Gynecology

## 2023-10-02 VITALS — BP 128/74 | Ht 68.0 in | Wt 171.0 lb

## 2023-10-02 DIAGNOSIS — Z124 Encounter for screening for malignant neoplasm of cervix: Secondary | ICD-10-CM

## 2023-10-02 DIAGNOSIS — Z01419 Encounter for gynecological examination (general) (routine) without abnormal findings: Secondary | ICD-10-CM

## 2023-10-02 DIAGNOSIS — D219 Benign neoplasm of connective and other soft tissue, unspecified: Secondary | ICD-10-CM

## 2023-10-02 DIAGNOSIS — Z1151 Encounter for screening for human papillomavirus (HPV): Secondary | ICD-10-CM

## 2023-10-02 DIAGNOSIS — Z1231 Encounter for screening mammogram for malignant neoplasm of breast: Secondary | ICD-10-CM

## 2023-10-02 DIAGNOSIS — Z3041 Encounter for surveillance of contraceptive pills: Secondary | ICD-10-CM

## 2023-10-02 DIAGNOSIS — A630 Anogenital (venereal) warts: Secondary | ICD-10-CM

## 2023-10-02 DIAGNOSIS — Z8742 Personal history of other diseases of the female genital tract: Secondary | ICD-10-CM | POA: Diagnosis present

## 2023-10-02 MED ORDER — LARIN 24 FE 1-20 MG-MCG(24) PO TABS: 1.0000 | ORAL_TABLET | Freq: Every day | ORAL | 3 refills | Status: AC

## 2023-10-02 NOTE — Patient Instructions (Addendum)
 I value your feedback and you entrusting [place] with your care. If you get a [facility] patient survey, I would appreciate you taking the time to let us know about your experience today. Thank you!  [facility] ([place])--[phone]

## 2023-10-07 LAB — CYTOLOGY - PAP
Comment: NEGATIVE
Diagnosis: NEGATIVE
High risk HPV: NEGATIVE

## 2023-10-09 NOTE — Unmapped (Signed)
 [facility] Specialty and Home Delivery Pharmacy Refill Coordination Note    Peggy Rojas, DOB: 21-Feb-1981  Phone: [phone] (home)       All above HIPAA information was verified with patient.         10/09/2023    10:06 AM   Specialty Rx Medication Refill Questionnaire   Which Medications would you like refilled and shipped? Hadlima   Please list all current allergies: None   Have you missed any doses in the last 30 days? No   Have you had any changes to your medication(s) since your last refill? No   How many days remaining of each medication do you have at home? 1   If receiving an injectable medication, next injection date is 10/23/2023   Have you experienced any side effects in the last 30 days? No   Please enter the full address (street address, city, state, zip code) where you would like your medication(s) to be delivered to. [address] [employer] [place] [address]   Please specify on which day you would like your medication(s) to arrive. Note: if you need your medication(s) within 3 days, please call the pharmacy to schedule your order at [phone]  10/17/2023   Has your insurance changed since your last refill? No   Would you like a pharmacist to call you to discuss your medication(s)? No   Do you require a signature for your package? (Note: if we are billing Medicare Part B or your order contains a controlled substance, we will require a signature) No   I have been provided my out of pocket cost for my medication and approve the pharmacy to charge the amount to my credit card on file. No   Additional Information Confirm         Completed refill call assessment today to schedule patient's medication shipment from the [facility] Delivery Pharm[facility] [phone]).  All relevant notes have been reviewed.       Confirmed patient received a Conservation officer, historic buildings and a Surveyor, mining with first shipment. The patient will receive a drug information handout for each medication shipped and additional FDA Medication Guides as required.         REFERRAL TO PHARMACIST     Referral to the pharmacist: Not needed      [facility]     Shipping address confirmed in Epic.     Delivery Scheduled: Yes, Expected medication delivery date: 4/4.     Medication will be delivered via Same Day Courier to the prescription address in Epic WAM.    [facility] Delivery Pharm[facility] Specialty Technician

## 2023-10-17 MED FILL — HADLIMA(CF) PUSHTOUCH 40 MG/0.4 ML SUBCUTANEOUS AUTO-INJECTOR: SUBCUTANEOUS | 28 days supply | Qty: 0.8 | Fill #5

## 2023-10-21 NOTE — Unmapped (Signed)
 [facility] Specialty and Home Delivery Pharmacy Refill Coordination Note    Specialty Lite Medication(s) to be Shipped:   Wegovy    Other medication(s) to be shipped: No additional medications requested for fill at this time     Peggy Rojas, DOB: 1980/08/16  Phone: [phone] (home)       All above HIPAA information was verified with patient.     Was a Nurse, learning disability used for this call? No    Changes to medications: [name] reports no changes at this time.  Changes to insurance: No      REFERRAL TO PHARMACIST     Referral to the pharmacist: Not needed      [facility]     Shipping address confirmed in Epic.     Cost and Payment: Patient has a $0 copay, payment information is not required.    Delivery Scheduled: Yes, Expected medication delivery date: 10/24/2023.     Medication will be delivered via Same Day Courier to the prescription address in Epic WAM.    Lanny Plan   [facility] Specialty and Home Delivery Pharmacy Specialty Technician

## 2023-10-24 MED FILL — WEGOVY 1 MG/0.5 ML SUBCUTANEOUS PEN INJECTOR: SUBCUTANEOUS | 28 days supply | Qty: 2 | Fill #1

## 2023-11-04 MED ORDER — HADLIMA(CF) PUSHTOUCH 40 MG/0.4 ML SUBCUTANEOUS AUTO-INJECTOR
SUBCUTANEOUS | 5 refills | 28.00000 days | Status: CN
Start: 2023-11-04 — End: ?

## 2023-11-04 NOTE — Unmapped (Signed)
 Peggy Rojas is a 43 y.o. 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     Comorbidities: HTN, insulin resistance, vitamin D def.   Barriers: nothing specific at this time.    Weight Summary:  Starting weight/BMI/WC/[place]: 257 lbs, BMI 40.82, WC 43, [place] 13 (02/07/2022)  Target weight/goal: No goal weight. Patient wants general improvement in health and appearance.  Today's weight/BMI:  , BMI   (11/26/2023)   Today's   (11/26/2023)    PCP is in [employer] through [employer] (private practice.)     Referred by: A friend who is also our patient.    GOALS:     I have reviewed the patient's medical history, lifestyle history and labs/tests.   My recommendations include the following:    Lifestyle Pattern Summary: See GOALS above.     Weight gain causing medication: None.    Medication:     CONTINUE: Metformin  500 mg BID with meals  CONTINUE: Wegovy  1.0 mg weekly injections    Tried/Contraindicated:  - Phentermine: WL of 19 lbs, couldn't tolerate d/t headaches and insomnia    Sleep: Currently sleeping 7 hours without sleep aids. STOP-BANG negative (2/8).    Stress management: Patient reports having good stress management techniques such as reading.     Obesity Surgery: Patient is not eligible but would consider in the future if needed. Patient verbalized that if Wegovy  is not covered, she would like to seriously consider surgery. (Updated 02/07/2022)    MASLD/NAFLD Screening: (11/04/23)  Computed FIB-4 Calculation unavailable. One or more values for this score either were not found within the given timeframe or did not fit some other criterion.     Medical conditions:  Glaucoma: no  Seizures: no  Medullary thyroid cancer (personal or family history): no  Multiple Endocrine Neoplasia: no  Palpitations/Tachycardia: no  Chest Pain: no past history of MI.   Headaches/Migraines: no  Nephrolithiasis: no  H/o pancreatitis: no  GERD: no     Prior Surgeries:  Cholecystectomy: no  Hysterectomy: no     Birth Control Methods: OCP

## 2023-11-04 NOTE — Unmapped (Unsigned)
 [facility] Follow Up    Assessment/Plan:     No chief complaint on file.    Problem List Items Addressed This Visit       Overweight (BMI 25.0-29.9)    Peggy Rojas is a 43 y.o. 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     Comorbidities: HTN, insulin resistance, vitamin D def.   Barriers: nothing specific at this time.    Weight Summary:  Starting weight/BMI/WC/[place]: 257 lbs, BMI 40.82, WC 43, [place] 13 (02/07/2022)  Target weight/goal: No goal weight. Patient wants general improvement in health and appearance.  Today's weight/BMI:  , BMI   (11/26/2023)   Today's   (11/26/2023)    PCP is in [employer] through [employer] (private practice.)     Referred by: A friend who is also our patient.    GOALS:     I have reviewed the patient's medical history, lifestyle history and labs/tests.   My recommendations include the following:    Lifestyle Pattern Summary: See GOALS above.     Weight gain causing medication: None.    Medication:     CONTINUE: Metformin  500 mg BID with meals  CONTINUE: Wegovy  1.0 mg weekly injections    Tried/Contraindicated:  - Phentermine: WL of 19 lbs, couldn't tolerate d/t headaches and insomnia    Sleep: Currently sleeping 7 hours without sleep aids. STOP-BANG negative (2/8).    Stress management: Patient reports having good stress management techniques such as reading.     Obesity Surgery: Patient is not eligible but would consider in the future if needed. Patient verbalized that if Wegovy  is not covered, she would like to seriously consider surgery. (Updated 02/07/2022)    MASLD/NAFLD Screening: (11/04/23)  Computed FIB-4 Calculation unavailable. One or more values for this score either were not found within the given timeframe or did not fit some other criterion. Medical conditions:  Glaucoma: no  Seizures: no  Medullary thyroid cancer (personal or family history): no  Multiple Endocrine Neoplasia: no  Palpitations/Tachycardia: no  Chest Pain: no past history of MI.   Headaches/Migraines: no  Nephrolithiasis: no  H/o pancreatitis: no  GERD: no     Prior Surgeries:  Cholecystectomy: no  Hysterectomy: no     Birth Control Methods: OCP         Insulin resistance - Primary    Previous A1C of 4.9%. Mild acanthosis nigricans on PE. Reviewed relevant medications and labs. Patient is following our Weight Management Clinic. See Obesity tab for medication changes.    Lab Results   Component Value Date    A1C 4.9 07/29/2023    A1C 5.5 02/07/2022                No follow-ups on file.    I have reviewed and addressed the patient???s adherence and response to prescribed medications. I have identified patient barriers to following the proposed medication and treatment plan, and have noted opportunities to optimize healthy behaviors. I have answered the patient???s questions to satisfaction and the patient voices understanding.    Time: Greater than 50% of this encounter was spent in direct consultation with the patient in evaluation and discussing all of the above. Duration of encounter: 20 minutes.     HPI:     Peggy Rojas is  a 43 y.o. female who  has a past medical history of Essential hypertension (02/07/2022) and Obesity (16010932). who presents today for [facility] follow up.    Weight Management History  Wt Readings from Last 6 Encounters:   09/01/23 75.3 kg (166 lb)   07/29/23 76.8 kg (169 lb 6.4 oz)   07/04/23 76.2 kg (168 lb)   06/19/23 76.2 kg (168 lb)   03/06/23 83.6 kg (184 lb 3.2 oz)   12/03/22 89.4 kg (197 lb)         02/07/2022     1:00 PM 05/02/2022     2:41 PM 07/02/2022    10:28 AM 09/03/2022     9:54 AM 12/03/2022     1:39 PM 03/06/2023    10:35 AM 07/29/2023    10:24 AM   Waist Circumference   Waist Circumference 43 inches 41.5 inches 38.5 inches 38 inches 37.5 inches 33 inches 31 inches       *** INSERT WEIGHT GRAPH    Update:   Medication: Patient is taking medication as directed. Patient is tolerating Metformin  500 mg BID with meals and Wegovy  1.0 mg weekly injections well with no s/e.   Eating Pattern:  - Fast food/Restaurants/Takeout: {FREQUENCY:108176}x per week.   - Breakfast: {BREAKFAST TFTD:322025}  - Lunch: {LUNCH KYHC:623762}  - Dinner: {DINNER GBTD:176160}  - Drinks: {DRINK LIST:108030}  - Snacks: {SNACK VPXT:062694}  Physical Activity:  {PHYSICALACTIVITY:108174} for *** mins, ***x per week.   Barrier: None discussed.     Lab Results   Component Value Date    A1C 4.9 07/29/2023    GLU 87 02/07/2022    CHOL 167 02/07/2022    LDL 95 02/07/2022    HDL 56 02/07/2022    TRIG 80 02/07/2022    AST 23 02/07/2022    ALT 24 02/07/2022    PLT 285 07/04/2023    TSH 1.103 02/07/2022     Past Medical/Surgical History:     Past Medical History:   Diagnosis Date    Essential hypertension 02/07/2022    Obesity [id number]     Past Surgical History:   Procedure Laterality Date    BREAST SURGERY  2016    IR EMBOLIZATION ORGAN ISCHEMIA, TUMORS, INFAR  07/04/2023    IR EMBOLIZATION ORGAN ISCHEMIA, TUMORS, INFAR 07/04/2023 Lansing Planas, MD IMG VIR H&V [facility]       Allergies:     Patient has no known allergies.    Social History:     Social History     Socioeconomic History    Marital status: Married   Tobacco Use    Smoking status: Never    Smokeless tobacco: Never   Vaping Use    Vaping status: Never Used   Substance and Sexual Activity    Alcohol use: Yes     Alcohol/week: 2.0 standard drinks of alcohol     Types: 2 Glasses of wine per week     Comment: occasionally    Drug use: Never    Sexual activity: Yes     Partners: Male     Birth control/protection: OCP       Family History:     No family history on file.    Medication list:       Current Outpatient Medications:     adalimumab -bwwd (HADLIMA ,CF, PUSHTOUCH) 40 mg/0.4 mL AtIn, Inject the contents of 1 pen (40 mg total) under the skin every fourteen (14) days., Disp: 2.4 mL, Rfl: 5  adalimumab -bwwd (HADLIMA ,CF, PUSHTOUCH) 40 mg/0.4 mL AtIn, Inject the contents of 1 auto-injector (40 mg) under the skin every fourteen (14) days., Disp: 0.8 mL, Rfl: 5    chlorthalidone  (HYGROTON ) 25 MG tablet, TAKE 1 TABLET BY MOUTH ONCE DAILY IN THE MORNING, Disp: 90 tablet, Rfl: 0    empty container Misc, Use as directed to dispose of injectable medications, Disp: 1 each, Rfl: 3    empty container Misc, use as directed, Disp: 1 each, Rfl: 2    ibuprofen (MOTRIN) 600 MG tablet, Take by mouth every hour as needed., Disp: , Rfl:     LARIN 24 FE 1 mg-20 mcg (24)/75 mg (4) Tab, Take 1 tablet by mouth daily., Disp: , Rfl:     metFORMIN  (GLUCOPHAGE -XR) 500 MG 24 hr tablet, Take 1 tablet (500 mg total) by mouth in the morning and 1 tablet (500 mg total) in the evening. Take with meals., Disp: 180 tablet, Rfl: 2    methylPREDNISolone  (MEDROL  DOSEPACK) 4 mg tablet, follow package directions, Disp: 21 each, Rfl: 0    multivitamin (TAB-A-VITE/THERAGRAN) per tablet, Take 1 tablet by mouth daily., Disp: , Rfl:     WEGOVY  1 MG/0.5 ML SUBCUTANEOUS PEN INJECTOR, Inject 1 mg under the skin every seven (7) days., Disp: 2 mL, Rfl: 2  Above medication list reviewed and updated.    ROS:     A 12 point review of systems was negative except for pertinent items noted in the HPI     Current Medications:     Current Outpatient Medications   Medication Sig Dispense Refill    adalimumab -bwwd (HADLIMA ,CF, PUSHTOUCH) 40 mg/0.4 mL AtIn Inject the contents of 1 pen (40 mg total) under the skin every fourteen (14) days. 2.4 mL 5    adalimumab -bwwd (HADLIMA ,CF, PUSHTOUCH) 40 mg/0.4 mL AtIn Inject the contents of 1 auto-injector (40 mg) under the skin every fourteen (14) days. 0.8 mL 5    chlorthalidone  (HYGROTON ) 25 MG tablet TAKE 1 TABLET BY MOUTH ONCE DAILY IN THE MORNING 90 tablet 0    empty container Misc Use as directed to dispose of injectable medications 1 each 3    empty container Misc use as directed 1 each 2    ibuprofen (MOTRIN) 600 MG tablet Take by mouth every hour as needed.      LARIN 24 FE 1 mg-20 mcg (24)/75 mg (4) Tab Take 1 tablet by mouth daily.      metFORMIN  (GLUCOPHAGE -XR) 500 MG 24 hr tablet Take 1 tablet (500 mg total) by mouth in the morning and 1 tablet (500 mg total) in the evening. Take with meals. 180 tablet 2    methylPREDNISolone  (MEDROL  DOSEPACK) 4 mg tablet follow package directions 21 each 0    multivitamin (TAB-A-VITE/THERAGRAN) per tablet Take 1 tablet by mouth daily.      WEGOVY  1 MG/0.5 ML SUBCUTANEOUS PEN INJECTOR Inject 1 mg under the skin every seven (7) days. 2 mL 2     No current facility-administered medications for this visit.       I have reviewed and (if needed) updated the patient's problem list, medications, allergies, past medical and surgical history, social and family history.    Vital Signs:     There is no height or weight on file to calculate BMI.      Wt Readings from Last 3 Encounters:   09/01/23 75.3 kg (166 lb)   07/29/23 76.8 kg (169 lb 6.4 oz)   07/04/23 76.2 kg (168 lb)  Temp Readings from Last 3 Encounters:   09/01/23 36.6 ??C (97.9 ??F) (Temporal)   07/04/23 36.4 ??C (97.5 ??F)   06/19/23 36.8 ??C (98.2 ??F)     BP Readings from Last 3 Encounters:   09/01/23 134/86   07/29/23 146/94   07/04/23 160/105     Pulse Readings from Last 3 Encounters:   09/01/23 74   07/29/23 73   07/04/23 93        Physical Exam:     General: well appearing, in NAD, There is no height or weight on file to calculate BMI. Ambulatory without help.  Body fat distribution: General adiposity. No supraclavicular adiposity. No dorsal adiposity.       Head: normocephalic atraumatic.  Eyes: PERRLA, EOMI, Sclera WNL.   Neck: supple, no LAD, no thyromegaly.  CV: RRR no rubs or murmurs.  Lungs: clear bilaterally to auscultation. No wheezing.  Extremities: no clubbing, cyanosis, No edema.  Skin: no concerning lesions, rashes observed. No lipomas  Neuro: Alert and oriented X 3.    Labs:     No visits with results within 1 Month(s) from this visit.   Latest known visit with results is:   Office Visit on 07/29/2023   Component Date Value Ref Range Status    Hemoglobin A1C 07/29/2023 4.9  4.0 - 6.0 % Final    A1C LOT NBR 07/29/2023 807,104   Final    A1C STRIP EXP 07/29/2023 05/14/2025   Final       Follow-up:     No follow-ups on file.    I attest that I, Lenton Rail, personally documented this note for [facility], MD.      Lenton Rail  11/26/2023     I attest that I have reviewed the note and that the components of the history of the present illness, the physical exam, and the assessment and plan documented were performed by me or were performed in my presence where I verified the documentation and performed (or re-performed) the exam and medical decision making.     Sebastian Dace, MD

## 2023-11-04 NOTE — Unmapped (Signed)
 Previous A1C of 4.9%. Mild acanthosis nigricans on PE. Reviewed relevant medications and labs. Patient is following our Weight Management Clinic. See Obesity tab for medication changes.    Lab Results   Component Value Date    A1C 4.9 07/29/2023    A1C 5.5 02/07/2022

## 2023-11-10 ENCOUNTER — Ambulatory Visit
Admission: RE | Admit: 2023-11-10 | Discharge: 2023-11-10 | Disposition: A | Discharge status: Home / Self Care | Source: Ambulatory Visit | Attending: Obstetrics and Gynecology | Admitting: Obstetrics and Gynecology

## 2023-11-10 DIAGNOSIS — Z1231 Encounter for screening mammogram for malignant neoplasm of breast: Secondary | ICD-10-CM | POA: Diagnosis present

## 2023-11-11 ENCOUNTER — Encounter: Payer: Self-pay | Admitting: Obstetrics and Gynecology

## 2023-11-14 MED FILL — WEGOVY 1 MG/0.5 ML SUBCUTANEOUS PEN INJECTOR: SUBCUTANEOUS | 28 days supply | Qty: 2 | Fill #2

## 2023-11-19 NOTE — Unmapped (Signed)
 The [facility] has made a second and final attempt to reach this patient to refill the following medication:HADLIMA(CF) PUSHTOUCH 40 mg/0.4 mL Atin (adalimumab-bwwd).      We have left voicemails on the following phone numbers: [phone] and have sent a text message to the following phone numbers: [phone].    Dates contacted: 4/28, 5/7  Last scheduled delivery: 4/4    The patient may be at risk of non-compliance with this medication. The patient should call the [facility] at [phone]  Option 4, then Option 2: Dermatology, Gastroenterology, Rheumatology to refill medication.    [facility] Specialty Technician

## 2023-12-03 MED ORDER — HADLIMA(CF) PUSHTOUCH 40 MG/0.4 ML SUBCUTANEOUS AUTO-INJECTOR
SUBCUTANEOUS | 5 refills | 28.00000 days
Start: 2023-12-03 — End: ?

## 2023-12-03 NOTE — Unmapped (Signed)
 [facility] Delivery [facility] Refill Coordination Note    Specialty Medication(s) to be Shipped:   Inflammatory Disorders: Hadlima    Other medication(s) to be shipped: No additional medications requested for fill at this time     Peggy Rojas, DOB: [birth year]  Phone: [phone] (home)       All above HIPAA information was verified with patient.     Was a Nurse, learning disability used for this call? No    Completed refill call assessment today to schedule patient's medication shipment from the [facility]  [phone]).  All relevant notes have been reviewed.     Specialty medication(s) and dose(s) confirmed: Regimen is correct and unchanged.   Changes to medications: Peggy Rojas reports no changes at this time.  Changes to insurance: No  New side effects reported not previously addressed with a pharmacist or physician: None reported  Questions for the pharmacist: No    Confirmed patient received a Conservation officer, historic buildings and a Surveyor, mining with first shipment. The patient will receive a drug information handout for each medication shipped and additional FDA Medication Guides as required.       DISEASE/MEDICATION-SPECIFIC INFORMATION        For patients on injectable medications: Patient currently has 0 doses left.  Next injection is scheduled for 5/22.    SPECIALTY MEDICATION ADHERENCE     Medication Adherence    Patient reported X missed doses in the last month: 0  Specialty Medication: HADLIMA(CF) PUSHTOUCH 40 mg/0.4 mL  Patient is on additional specialty medications: No              Were doses missed due to medication being on hold? No    Hadlima 40/0.4 mg/ml: 0 doses of medicine on hand        REFERRAL TO PHARMACIST     Referral to the pharmacist: Not needed      [facility]     Shipping address confirmed in Epic.     Cost and Payment: Patient has a $0 copay, payment information is not required.    Delivery Scheduled: Yes, Expected medication delivery date: 12/04/23. Medication will be delivered via Same Day Courier to the prescription address in Epic [place].    Canary Ceo   [facility] Pharm[facility]  Specialty Technician

## 2023-12-04 MED FILL — HADLIMA(CF) PUSHTOUCH 40 MG/0.4 ML SUBCUTANEOUS AUTO-INJECTOR: SUBCUTANEOUS | 28 days supply | Qty: 0.8 | Fill #0

## 2023-12-15 MED ORDER — WEGOVY 1 MG/0.5 ML SUBCUTANEOUS PEN INJECTOR
SUBCUTANEOUS | 2 refills | 28.00000 days
Start: 2023-12-15 — End: ?

## 2023-12-15 NOTE — Unmapped (Signed)
 Dose changed

## 2023-12-17 DIAGNOSIS — E88819 Insulin resistance: Principal | ICD-10-CM

## 2023-12-17 MED ORDER — WEGOVY 1 MG/0.5 ML SUBCUTANEOUS PEN INJECTOR
SUBCUTANEOUS | 2 refills | 28.00000 days
Start: 2023-12-17 — End: ?

## 2023-12-17 MED ORDER — METFORMIN ER 500 MG TABLET,EXTENDED RELEASE 24 HR
ORAL_TABLET | Freq: Two times a day (BID) | ORAL | 2 refills | 90.00000 days
Start: 2023-12-17 — End: ?

## 2023-12-17 NOTE — Unmapped (Signed)
 Needs to titrate up

## 2023-12-18 MED ORDER — WEGOVY 1 MG/0.5 ML SUBCUTANEOUS PEN INJECTOR
SUBCUTANEOUS | 2 refills | 28.00000 days
Start: 2023-12-18 — End: 2023-12-18

## 2023-12-18 MED ORDER — METFORMIN ER 500 MG TABLET,EXTENDED RELEASE 24 HR
ORAL_TABLET | Freq: Two times a day (BID) | ORAL | 2 refills | 90.00000 days | Status: CP
Start: 2023-12-18 — End: ?
  Filled 2023-12-19: qty 180, 90d supply, fill #0

## 2023-12-18 NOTE — Unmapped (Signed)
 Pls advise.

## 2023-12-18 NOTE — Unmapped (Signed)
 Patient is requesting the following refill  Requested Prescriptions     Pending Prescriptions Disp Refills    WEGOVY 1 MG/0.5 ML SUBCUTANEOUS PEN INJECTOR 2 mL 2     Sig: Inject 1 mg under the skin every seven (7) days.       Recent Visits  Date Type Provider Dept   07/29/23 Office Visit Ro, Kathy Parker, MD [facility] [address] [facility]   03/06/23 Office Visit Ro, Kathy Parker, MD [facility] [address] [place]   Showing recent visits within past 365 days and meeting all other requirements  Future Appointments  Date Type Provider Dept   02/18/24 Appointment Ro, Kathy Parker, MD [facility] [address] [place]   Showing future appointments within next 365 days and meeting all other requirements       Labs: Not applicable this refill

## 2023-12-18 NOTE — Unmapped (Signed)
 Duplicate request, sent on 12/18/2023

## 2023-12-18 NOTE — Unmapped (Signed)
 Addended by: Warner Haas on: 12/18/2023 01:45 PM     Modules accepted: Orders

## 2023-12-18 NOTE — Unmapped (Signed)
 Copied from CRM [id number]. Topic: Access To Clinicians - Medication Refill  >> Dec 18, 2023  3:01 PM Peggy Rojas wrote:  Medication was sent to the wrong pharmacy. Please resend:    The patient is requesting the following:     Medication(s) for refill: Wegovy   Quantity for 1 month supply    Pharmacy name and address: [facility]  [address] [place] [address]  Phone: [phone] Fax: [phone]  Hours: 8 AM to 4:30 PM Monday-Friday    Please make this the primary pharmacy from now on. She does not go to the [place] for [employer].     Please contact The patient by Cell Phone in regards to this request.    Coverage: yes, coverage is accurate on file.    Urgent callback turnaround time: within 24 business hours. (Caller Notified)    Urgent Reason: Requiring a resend

## 2023-12-18 NOTE — Unmapped (Signed)
 Copied from CRM #[id number]. Topic: Access To Clinicians - Medication Refill  >> Dec 18, 2023  8:49 AM Danie Duran wrote:  The caller reports that they have previously requested refill from pharmacy, but have not received authorization yet.     The patient is requesting the following:     Medication(s) for refill: Wegovy   Quantity for 1 month supply    Pharmacy name and address: [facility]     Please contact The patient by Cell Phone in regards to this request.    Coverage: yes, coverage is accurate on file.    Urgent callback turnaround time: within 24 business hours. (Caller Notified)    Urgent Reason: Almost or completely out of medication(s)

## 2023-12-18 NOTE — Unmapped (Signed)
 Patient is requesting the following refill  Requested Prescriptions     Pending Prescriptions Disp Refills    metFORMIN (GLUCOPHAGE-XR) 500 MG 24 hr tablet 180 tablet 2     Sig: Take 1 tablet (500 mg total) by mouth in the morning and 1 tablet (500 mg total) in the evening. Take with meals.       Recent Visits  Date Type Provider Dept   07/29/23 Office Visit Ro, Kathy Parker, MD [facility] [address] [facility]   03/06/23 Office Visit Ro, Kathy Parker, MD [facility] [address] [place]   Showing recent visits within past 365 days and meeting all other requirements  Future Appointments  Date Type Provider Dept   02/18/24 Appointment Ro, Kathy Parker, MD [facility] [address] [place]   Showing future appointments within next 365 days and meeting all other requirements       Labs: Not applicable this refill

## 2023-12-19 MED ORDER — WEGOVY 1 MG/0.5 ML SUBCUTANEOUS PEN INJECTOR
SUBCUTANEOUS | 2 refills | 28.00000 days
Start: 2023-12-19 — End: ?

## 2023-12-19 NOTE — Unmapped (Signed)
 [facility] Refill Coordination Note    Specialty Medication(s) to be Shipped:   Inflammatory Disorders: Hadlima    Other medication(s) to be shipped: No additional medications requested for fill at this time     Peggy Rojas, DOB: 1980/08/18  Phone: [phone] (home)       All above HIPAA information was verified with patient.     Was a Nurse, learning disability used for this call? No    Completed refill call assessment today to schedule patient's medication shipment from the [facility] Delivery Pharm[facility]  [phone]).  All relevant notes have been reviewed.     Specialty medication(s) and dose(s) confirmed: Regimen is correct and unchanged.   Changes to medications: Ayat reports no changes at this time.  Changes to insurance: No  New side effects reported not previously addressed with a pharmacist or physician: None reported  Questions for the pharmacist: No    Confirmed patient received a Conservation officer, historic buildings and a Surveyor, mining with first shipment. The patient will receive a drug information handout for each medication shipped and additional FDA Medication Guides as required.       DISEASE/MEDICATION-SPECIFIC INFORMATION        For patients on injectable medications: Patient currently has 0 doses left.  Next injection is scheduled for 01/01/24.    SPECIALTY MEDICATION ADHERENCE     Medication Adherence    Patient reported X missed doses in the last month: 0  Specialty Medication: HADLIMA(CF) PUSHTOUCH 40 mg/0.4 mL Atin (adalimumab-bwwd)  Patient is on additional specialty medications: No              Were doses missed due to medication being on hold? No    HADLIMA(CF) PUSHTOUCH 40 mg/0.4 mL Atin (adalimumab-bwwd): 0 doses of medicine on hand       REFERRAL TO PHARMACIST     Referral to the pharmacist: Not needed      [facility]     Shipping address confirmed in Epic.     Cost and Payment: Patient has a $0 copay, payment information is not required.    Delivery Scheduled: Yes, Expected medication delivery date: 12/25/23.     Medication will be delivered via Same Day Courier to the prescription address in Epic [place].    Lucero Ide   [facility] Delivery Pharm[facility]  Specialty Technician

## 2023-12-25 MED FILL — HADLIMA(CF) PUSHTOUCH 40 MG/0.4 ML SUBCUTANEOUS AUTO-INJECTOR: SUBCUTANEOUS | 28 days supply | Qty: 0.8 | Fill #1

## 2023-12-25 MED FILL — WEGOVY 1 MG/0.5 ML SUBCUTANEOUS PEN INJECTOR: SUBCUTANEOUS | 28 days supply | Qty: 2 | Fill #0

## 2024-01-13 NOTE — Unmapped (Signed)
 [facility] Delivery Pharm[facility] Refill Coordination Note    Peggy Rojas, DOB: 04/08/1981  Phone: [phone] (home)       All above HIPAA information was verified with patient.         01/12/2024     1:01 PM   Specialty Rx Medication Refill Questionnaire   Which Medications would you like refilled and shipped? Hadlima  and wegovy    Please list all current allergies: None   Have you missed any doses in the last 30 days? No   Have you had any changes to your medication(s) since your last refill? No   How many days remaining of each medication do you have at home? 6   If receiving an injectable medication, next injection date is 01/15/2024   Have you experienced any side effects in the last 30 days? No   Please enter the full address (street address, city, state, zip code) where you would like your medication(s) to be delivered to. [address] [employer] [place] [address]   Please specify on which day you would like your medication(s) to arrive. Note: if you need your medication(s) within 3 days, please call the pharmacy to schedule your order at [phone]  01/20/2024   Has your insurance changed since your last refill? No   Would you like a pharmacist to call you to discuss your medication(s)? No   Do you require a signature for your package? (Note: if we are billing Medicare Part B or your order contains a controlled substance, we will require a signature) No   I have been provided my out of pocket cost for my medication and approve the pharmacy to charge the amount to my credit card on file. Yes         Completed refill call assessment today to schedule patient's medication shipment from the [facility] and Home Delivery Pharmacy [phone]).  All relevant notes have been reviewed.       Confirmed patient received a Conservation officer, historic buildings and a Surveyor, mining with first shipment. The patient will receive a drug information handout for each medication shipped and additional FDA Medication Guides as required.         REFERRAL TO PHARMACIST     Referral to the pharmacist: Not needed      [facility]     Shipping address confirmed in Epic.     Delivery Scheduled: Yes, Expected medication delivery date: 01/20/24.     Medication will be delivered via Same Day Courier to the prescription address in Epic [facility].    Dena LOISE Gave   [facility] Home Delivery Pharmacy Specialty Technician

## 2024-01-19 DIAGNOSIS — H209 Unspecified iridocyclitis: Principal | ICD-10-CM

## 2024-01-19 DIAGNOSIS — D869 Sarcoidosis, unspecified: Principal | ICD-10-CM

## 2024-01-19 DIAGNOSIS — E66813 Class 3 severe obesity without serious comorbidity with body mass index (BMI) of 40.0 to 44.9 in adult, unspecified obesity type (CMS-HCC): Principal | ICD-10-CM

## 2024-01-19 DIAGNOSIS — Z6841 Body Mass Index (BMI) 40.0 and over, adult: Principal | ICD-10-CM

## 2024-01-20 NOTE — Unmapped (Signed)
 Peggy Rojas 's HADLIMA (CF) PUSHTOUCH 40 mg/0.4 mL Atin (adalimumab -bwwd) shipment will be delayed as a result of prior authorization being required by the patient's insurance.     I have reached out to the patient  at [phone] and communicated the delay. We will call the patient back to reschedule the delivery upon resolution. We have not confirmed the new delivery date.     *Wegovy  also pending PA*

## 2024-01-23 NOTE — Unmapped (Signed)
 Pls review and advise.

## 2024-01-23 NOTE — Unmapped (Signed)
 Copied from CRM #[id number]. Topic: Access To Clinicians - Req Clinic Call Back  >> Jan 23, 2024 12:21 PM Creighton RAMAN wrote:  Pt was advised that the Prior Auth for WEGOVY  1 MG/0.5 ML SUBCUTANEOUS PEN INJECTOR [[id number]] has insignificant amount of information. Please advise.

## 2024-01-26 NOTE — Unmapped (Signed)
 See request

## 2024-01-26 NOTE — Unmapped (Signed)
 Copied from CRM #[id number]. Topic: Referral - Prior Authorization Status & Insurance  >> Jan 26, 2024  9:47 AM Hadassah LABOR wrote:  The patient is requesting prior auth denial for Wegovy . Patient would like  call back to advise what next steps are to either get medication approved or if new medication can be prescribed.     Coverage: yes, coverage is accurate on file.    Routine callback turnaround time: 24-48 business hours. Programmer, systems Notified)

## 2024-01-27 NOTE — Unmapped (Unsigned)
 Copied from CRM #[id number]. Topic: Access To Clinicians - Req Clinic Call Back  >> Jan 27, 2024 10:39 AM Justice S wrote:  Please call the patient for the status of the Prior Auth for Wegovy . The patient is out of this medication, she has called several times for this. Please call her back [phone]

## 2024-01-27 NOTE — Unmapped (Signed)
 Peggy Rojas 's Hadlima  shipment will be canceled as a result of PA denied     I have reached out to the patient  at [phone] and communicated the delay. We will not reschedule the medication and have removed this/these medication(s) from the work request.  We have canceled this work request.

## 2024-01-27 NOTE — Unmapped (Signed)
 See denial in referrals per [place] [facility].

## 2024-01-27 NOTE — Unmapped (Signed)
 Call placed to patient insurance regarding denial. Per chart and patient, patient experienced side effects with the 1.7 mg dose, will need to remain on the 1 mg dose. Per plan rep advisement, additional notes faxed to plan with this information, awaiting decision

## 2024-01-28 MED ORDER — WEGOVY 1.7 MG/0.75 ML SUBCUTANEOUS PEN INJECTOR
SUBCUTANEOUS | 1 refills | 42.00000 days
Start: 2024-01-28 — End: ?

## 2024-01-28 NOTE — Unmapped (Signed)
 Pls advise on request

## 2024-01-29 MED ORDER — WEGOVY 1.7 MG/0.75 ML SUBCUTANEOUS PEN INJECTOR
SUBCUTANEOUS | 1 refills | 42.00000 days
Start: 2024-01-29 — End: ?

## 2024-01-29 NOTE — Unmapped (Signed)
 Decreased dose to 1mg 

## 2024-01-29 NOTE — Unmapped (Signed)
Pls follow up with pt.

## 2024-01-29 NOTE — Unmapped (Signed)
 Patient called back in, states she just spoke with her pharmacy and was told it was again denied. Pt states if they are going to continue to make the patient jump through hoops for the 1MG  but will pay for 1.7 MG then she will just take it. She would like a call back asap.

## 2024-01-29 NOTE — Unmapped (Signed)
 See message.

## 2024-01-29 NOTE — Unmapped (Signed)
 Spoke with the patient as well as Teacher, early years/pre. Reconsideration initiated on 01/28/24, no decision has been reached yet. Pharmacy ran claim based on initial denial. Patient thanked clinic for clarifying and assisting.

## 2024-01-30 NOTE — Unmapped (Signed)
 PT is following up on this requesting and if there has been any future information on an approval. Please advise.

## 2024-01-30 NOTE — Unmapped (Signed)
 Returned call to patient again, have spoke to patient 3 times in the past 48 hours regarding PA. Patient again advised that she will be contacted when a decision is received.

## 2024-01-30 NOTE — Unmapped (Signed)
 See call.

## 2024-02-02 NOTE — Unmapped (Signed)
 Peggy Rojas is a 43 y.o. 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     Comorbidities: HTN, insulin resistance, vitamin D def.   Barriers: nothing specific at this time.    Weight Summary:  Starting weight/BMI/WC/[place]: 257 lbs, BMI 40.82, WC 43, [place] 13 (02/07/2022)  Target weight/goal: No goal weight. Patient wants general improvement in health and appearance.  Today's weight/BMI:  , BMI   (02/18/2024)   % Body weight loss: 23.3%  Today's   (02/18/2024)    PCP is in [place] through [facility] (private practice.)     Referred by: A friend who is also our patient.    GOALS:     I have reviewed the patient's medical history, lifestyle history and labs/tests.   My recommendations include the following:    Lifestyle Pattern Summary: See GOALS above.     Weight gain causing medication: None.    Medication:     Tried/Contraindicated:  - Phentermine: WL of 19 lbs, couldn't tolerate d/t headaches and insomnia    Sleep: Currently sleeping 7 hours without sleep aids. STOP-BANG negative (2/8).    Stress management: Patient reports having good stress management techniques such as reading.     Obesity Surgery: Patient is not eligible but would consider in the future if needed. Patient verbalized that if Wegovy  is not covered, she would like to seriously consider surgery. (Updated 02/07/2022)    MASLD/NAFLD Screening: (02/02/24)  Points <1.45: Cirrhosis less likely  Computed FIB-4 Calculation unavailable. One or more values for this score either were not found within the given timeframe or did not fit some other criterion.    Medical conditions:  Glaucoma: no  Seizures: no  Medullary thyroid cancer (personal or family history): no  Multiple Endocrine Neoplasia: no  Palpitations/Tachycardia: no  Chest Pain: no past history of MI. Headaches/Migraines: no  Nephrolithiasis: no  H/o pancreatitis: no  GERD: no     Prior Surgeries:  Cholecystectomy: no  Hysterectomy: no     Birth Control Methods: OCP

## 2024-02-02 NOTE — Unmapped (Signed)
 Previous A1C of 4.9%. Mild acanthosis nigricans on PE. Reviewed relevant medications and labs. Patient is following our Weight Management Clinic. See Obesity tab for medication changes.    Lab Results   Component Value Date    A1C 4.9 07/29/2023    A1C 5.5 02/07/2022

## 2024-02-02 NOTE — Unmapped (Unsigned)
 [facility] Follow Up    Assessment/Plan:     No chief complaint on file.    Problem List Items Addressed This Visit          Endocrine    Insulin resistance - Primary    Previous A1C of 4.9%. Mild acanthosis nigricans on PE. Reviewed relevant medications and labs. Patient is following our [facility]. See Obesity tab for medication changes.    Lab Results   Component Value Date    A1C 4.9 07/29/2023    A1C 5.5 02/07/2022                  Other    Overweight (BMI 25.0-29.9)    Peggy Rojas is a 43 y.o. 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     Comorbidities: HTN, insulin resistance, vitamin D def.   Barriers: nothing specific at this time.    Weight Summary:  Starting weight/BMI/WC/[place]: 257 lbs, BMI 40.82, WC 43, [place] 13 (02/07/2022)  Target weight/goal: No goal weight. Patient wants general improvement in health and appearance.  Today's weight/BMI:  , BMI   (02/18/2024)   % Body weight loss: 23.3%  Today's   (02/18/2024)    PCP is in [place] through [facility] (private practice.)     Referred by: A friend who is also our patient.    GOALS:     I have reviewed the patient's medical history, lifestyle history and labs/tests.   My recommendations include the following:    Lifestyle Pattern Summary: See GOALS above.     Weight gain causing medication: None.    Medication:     Tried/Contraindicated:  - Phentermine: WL of 19 lbs, couldn't tolerate d/t headaches and insomnia    Sleep: Currently sleeping 7 hours without sleep aids. STOP-BANG negative (2/8).    Stress management: Patient reports having good stress management techniques such as reading.     Obesity Surgery: Patient is not eligible but would consider in the future if needed. Patient verbalized that if Wegovy  is not covered, she would like to seriously consider surgery. (Updated 02/07/2022)    MASLD/NAFLD Screening: (02/02/24)  Points <1.45: Cirrhosis less likely  Computed FIB-4 Calculation unavailable. One or more values for this score either were not found within the given timeframe or did not fit some other criterion.    Medical conditions:  Glaucoma: no  Seizures: no  Medullary thyroid cancer (personal or family history): no  Multiple Endocrine Neoplasia: no  Palpitations/Tachycardia: no  Chest Pain: no past history of MI.   Headaches/Migraines: no  Nephrolithiasis: no  H/o pancreatitis: no  GERD: no     Prior Surgeries:  Cholecystectomy: no  Hysterectomy: no     Birth Control Methods: OCP          No follow-ups on file.    I have reviewed and addressed the patient???s adherence and response to prescribed medications. I have identified patient barriers to following the proposed medication and treatment plan, and have noted opportunities to optimize healthy behaviors. I have answered the patient???s questions to satisfaction and the patient voices understanding.    Time: Greater than 50% of this encounter was spent in direct consultation with the patient in evaluation and discussing all of the above. Duration of encounter: 20 minutes.  HPI:     Peggy Rojas is a 43 y.o. female who  has a past medical history of Essential hypertension (02/07/2022) and Obesity ([id number]). who presents today for [facility] follow up.    Weight Management History  Wt Readings from Last 6 Encounters:   09/01/23 75.3 kg (166 lb)   07/29/23 76.8 kg (169 lb 6.4 oz)   07/04/23 76.2 kg (168 lb)   06/19/23 76.2 kg (168 lb)   03/06/23 83.6 kg (184 lb 3.2 oz)   12/03/22 89.4 kg (197 lb)         02/07/2022     1:00 PM 05/02/2022     2:41 PM 07/02/2022    10:28 AM 09/03/2022     9:54 AM 12/03/2022     1:39 PM 03/06/2023    10:35 AM 07/29/2023    10:24 AM   Waist Circumference   Waist Circumference 43 inches 41.5 inches 38.5 inches 38 inches 37.5 inches 33 inches 31 inches       *** INSERT WEIGHT GRAPH    Update:   Medication:   Eating Pattern:  - Fast food/Restaurants/Takeout: {FREQUENCY:108176}x per week.   - Breakfast: {BREAKFAST OPDU:891973}  - Lunch: {LUNCH OPDU:891972}  - Dinner: {DINNER OPDU:891971}  - Drinks: {DRINK LIST:108030}  - Snacks: {SNACK OPDU:891968}  Physical Activity:  {PHYSICALACTIVITY:108174} for *** mins, ***x per week.  Barrier: None discussed.     Lab Results   Component Value Date    A1C 4.9 07/29/2023    GLU 87 02/07/2022    CHOL 167 02/07/2022    LDL 95 02/07/2022    HDL 56 02/07/2022    TRIG 80 02/07/2022    AST 23 02/07/2022    ALT 24 02/07/2022    PLT 285 07/04/2023    TSH 1.103 02/07/2022     Past Medical/Surgical History:     Past Medical History[1]  Past Surgical History[2]    Allergies:     Patient has no known allergies.    Social History:     Social History[3]    Family History:     Family History[4]    Medication list:     Current Medications[5]  Above medication list reviewed and updated.    ROS:     A 12 point review of systems was negative except for pertinent items noted in the HPI     Current Medications:     Current Medications[6]    I have reviewed and (if needed) updated the patient's problem list, medications, allergies, past medical and surgical history, social and family history.    Vital Signs:     There is no height or weight on file to calculate BMI.      Wt Readings from Last 3 Encounters:   09/01/23 75.3 kg (166 lb)   07/29/23 76.8 kg (169 lb 6.4 oz)   07/04/23 76.2 kg (168 lb)     Temp Readings from Last 3 Encounters:   09/01/23 36.6 ??C (97.9 ??F) (Temporal)   07/04/23 36.4 ??C (97.5 ??F)   06/19/23 36.8 ??C (98.2 ??F)     BP Readings from Last 3 Encounters:   09/01/23 134/86   07/29/23 146/94   07/04/23 160/105     Pulse Readings from Last 3 Encounters:   09/01/23 74   07/29/23 73   07/04/23 93        Physical Exam:     General: well appearing, in NAD, There is no height or weight on file  to calculate BMI. Ambulatory without help.  Body fat distribution: General adiposity. No supraclavicular adiposity. No dorsal adiposity.       Head: normocephalic atraumatic.  Eyes: PERRLA, EOMI, Sclera WNL.   Neck: supple, no LAD, no thyromegaly.  CV: RRR no rubs or murmurs.  Lungs: clear bilaterally to auscultation. No wheezing.  Extremities: no clubbing, cyanosis, No edema.  Skin: no concerning lesions, rashes observed. No lipomas  Neuro: Alert and oriented X 3.    Labs:     No visits with results within 1 Month(s) from this visit.   Latest known visit with results is:   Office Visit on 07/29/2023   Component Date Value Ref Range Status    Hemoglobin A1C 07/29/2023 4.9  4.0 - 6.0 % Final    A1C LOT NBR 07/29/2023 807,104   Final    A1C STRIP EXP 07/29/2023 05/14/2025   Final       Follow-up:     No follow-ups on file.    I attest that I, Prentice Mayer, personally documented this note for LAURAINE JINNY HUTCH, MD.      Prentice Mayer  02/18/2024     I attest that I have reviewed the note and that the components of the history of the present illness, the physical exam, and the assessment and plan documented were performed by me or were performed in my presence where I verified the documentation and performed (or re-performed) the exam and medical decision making.     LAURAINE JINNY HUTCH, MD         [1]   Past Medical History:  Diagnosis Date    Essential hypertension 02/07/2022    Obesity [id number]   [2]   Past Surgical History:  Procedure Laterality Date    BREAST SURGERY  2016    IR EMBOLIZATION ORGAN ISCHEMIA, TUMORS, INFAR  07/04/2023    IR EMBOLIZATION ORGAN ISCHEMIA, TUMORS, INFAR 07/04/2023 Rosalita Meade Hadassah Chandra, MD IMG VIR [facility]   [3]   Social History  Socioeconomic History    Marital status: Married   Tobacco Use    Smoking status: Never    Smokeless tobacco: Never   Vaping Use    Vaping status: Never Used   Substance and Sexual Activity    Alcohol use: Yes     Alcohol/week: 2.0 standard drinks of alcohol     Types: 2 Glasses of wine per week     Comment: occasionally    Drug use: Never    Sexual activity: Yes     Partners: Male     Birth control/protection: OCP   [4] No family history on file.  [5]   Current Outpatient Medications:     adalimumab -bwwd (HADLIMA ,CF, PUSHTOUCH) 40 mg/0.4 mL AtIn, Inject the contents of 1 pen (40 mg total) under the skin every fourteen (14) days., Disp: 2.4 mL, Rfl: 5    adalimumab -bwwd (HADLIMA ,CF, PUSHTOUCH) 40 mg/0.4 mL AtIn, Inject the contents of 1 pen (40 mg) under the skin every fourteen (14) days., Disp: 0.8 mL, Rfl: 5    chlorthalidone  (HYGROTON ) 25 MG tablet, TAKE 1 TABLET BY MOUTH ONCE DAILY IN THE MORNING, Disp: 90 tablet, Rfl: 0    empty container Misc, Use as directed to dispose of injectable medications, Disp: 1 each, Rfl: 3    empty container Misc, use as directed, Disp: 1 each, Rfl: 2    ibuprofen (MOTRIN) 600 MG tablet, Take by mouth every hour as needed., Disp: , Rfl:  LARIN 24 FE 1 mg-20 mcg (24)/75 mg (4) Tab, Take 1 tablet by mouth daily., Disp: , Rfl:     metFORMIN  (GLUCOPHAGE -XR) 500 MG 24 hr tablet, Take 1 tablet (500 mg total) by mouth in the morning and 1 tablet (500 mg total) in the evening. Take with meals., Disp: 180 tablet, Rfl: 2    methylPREDNISolone  (MEDROL  DOSEPACK) 4 mg tablet, follow package directions, Disp: 21 each, Rfl: 0    multivitamin (TAB-A-VITE/THERAGRAN) per tablet, Take 1 tablet by mouth daily., Disp: , Rfl:     WEGOVY  1 MG/0.5 ML SUBCUTANEOUS PEN INJECTOR, Inject 1 mg under the skin every seven (7) days., Disp: 2 mL, Rfl: 2  [6]   Current Outpatient Medications   Medication Sig Dispense Refill    adalimumab -bwwd (HADLIMA ,CF, PUSHTOUCH) 40 mg/0.4 mL AtIn Inject the contents of 1 pen (40 mg total) under the skin every fourteen (14) days. 2.4 mL 5    adalimumab -bwwd (HADLIMA ,CF, PUSHTOUCH) 40 mg/0.4 mL AtIn Inject the contents of 1 pen (40 mg) under the skin every fourteen (14) days. 0.8 mL 5    chlorthalidone  (HYGROTON ) 25 MG tablet TAKE 1 TABLET BY MOUTH ONCE DAILY IN THE MORNING 90 tablet 0    empty container Misc Use as directed to dispose of injectable medications 1 each 3    empty container Misc use as directed 1 each 2    ibuprofen (MOTRIN) 600 MG tablet Take by mouth every hour as needed.      LARIN 24 FE 1 mg-20 mcg (24)/75 mg (4) Tab Take 1 tablet by mouth daily.      metFORMIN  (GLUCOPHAGE -XR) 500 MG 24 hr tablet Take 1 tablet (500 mg total) by mouth in the morning and 1 tablet (500 mg total) in the evening. Take with meals. 180 tablet 2    methylPREDNISolone  (MEDROL  DOSEPACK) 4 mg tablet follow package directions 21 each 0    multivitamin (TAB-A-VITE/THERAGRAN) per tablet Take 1 tablet by mouth daily.      WEGOVY  1 MG/0.5 ML SUBCUTANEOUS PEN INJECTOR Inject 1 mg under the skin every seven (7) days. 2 mL 2     No current facility-administered medications for this visit.

## 2024-02-03 NOTE — Unmapped (Signed)
 Received an approval on the prior authorization submitted for prescription Wegovy . Call placed to the patient to make them aware, copy of approval scanned to the chart.

## 2024-02-04 MED FILL — WEGOVY 1 MG/0.5 ML SUBCUTANEOUS PEN INJECTOR: SUBCUTANEOUS | 28 days supply | Qty: 2 | Fill #1

## 2024-02-05 NOTE — Unmapped (Signed)
 [facility] Follow Up    Assessment/Plan:     No chief complaint on file.    Problem List Items Addressed This Visit          Endocrine    Insulin resistance - Primary    Previous A1C of 4.9%. Mild acanthosis nigricans on PE. Reviewed relevant medications and labs. Patient is following our Weight Management Clinic. See Obesity tab for medication changes.    Lab Results   Component Value Date    A1C 4.9 07/29/2023    A1C 5.5 02/07/2022               Relevant Orders    CBC w/ Differential    Comprehensive Metabolic Panel       Cardiovascular and Mediastinum    Essential hypertension    BP is slightly elevated. Follow closely with PCP. We will continue to work on [employer].  Advised patient to schedule a visit with PCP to address further- she is on no HTN medications. Reviewed relevant medications and labs. Patient is following our Weight Management Clinic. See Obesity tab for medication changes.    BP Readings from Last 3 Encounters:   02/11/24 146/89   09/01/23 134/86   07/29/23 146/94                 Other    Overweight (BMI 25.0-29.9)    Peggy Rojas is a 43 y.o. 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     Comorbidities: HTN, insulin resistance, vitamin D def.   Barriers: nothing specific at this time.    Weight Summary:  Starting weight/BMI/WC/[place]: 257 lbs, BMI 40.82, WC 43, [place] 13 (02/07/2022)  Target weight/goal: No goal weight. Patient wants general improvement in health and appearance.  Today's weight/BMI: 78.3 kg (172 lb 9.6 oz), BMI 26.25 (02/11/2024)   % Body weight loss: 23.3%  Today's Waist Circumference: 30.25 inches (02/11/2024)    PCP is in [employer] through [employer] (private practice.)     Referred by: A friend who is also our patient.    GOALS: 1) Continue with healthy eating pattern and physical activity regimen!     I have reviewed the patient's medical history, lifestyle history and labs/tests.   My recommendations include the following:    Lifestyle Pattern Summary: See GOALS above.     Weight gain causing medication: None.    Medication: Patient is taking Wegovy  1 mg qwk and metformin  500 mg BID, tolerating these well. Discussed and decided to continue current regimen.     CONTINUE: Wegovy  1 mg qwk   CONTINUE: metformin  500 mg BID    Tried/Contraindicated:  - Phentermine: WL of 19 lbs, couldn't tolerate d/t headaches and insomnia    Sleep: Currently sleeping 7 hours without sleep aids. STOP-BANG negative (2/8).    Stress management: Patient reports having good stress management techniques such as reading.     Obesity Surgery: Patient is not eligible but would consider in the future if needed. Patient verbalized that if Wegovy  is not covered, she would like to seriously consider surgery. (Updated 02/07/2022)    MASLD/NAFLD Screening: (02/11/24)  Points <1.45: Cirrhosis less likely  Computed FIB-4 Calculation unavailable. One or more values for this score either were not found within the given timeframe or did not fit  some other criterion.    Medical conditions:  Glaucoma: no  Seizures: no  Medullary thyroid cancer (personal or family history): no  Multiple Endocrine Neoplasia: no  Palpitations/Tachycardia: no  Chest Pain: no past history of MI.   Headaches/Migraines: no  Nephrolithiasis: no  H/o pancreatitis: no  GERD: no     Prior Surgeries:  Cholecystectomy: no  Hysterectomy: no     Birth Control Methods: OCP            Return in about 3 months (around 05/13/2024) for Weight clinic F/U one slot..    I have reviewed and addressed the patient???s adherence and response to prescribed medications. I have identified patient barriers to following the proposed medication and treatment plan, and have noted opportunities to optimize healthy behaviors. I have answered the patient???s questions to satisfaction and the patient voices understanding.    Time: Greater than 50% of this encounter was spent in direct consultation with the patient in evaluation and discussing all of the above. Duration of encounter: 20 minutes.     HPI:     Peggy Rojas is a 43 y.o. female who  has a past medical history of Essential hypertension (02/07/2022) and Obesity ([id number]). who presents today for [facility] follow up.    Weight Management History  Wt Readings from Last 6 Encounters:   02/11/24 78.3 kg (172 lb 9.6 oz)   09/01/23 75.3 kg (166 lb)   07/29/23 76.8 kg (169 lb 6.4 oz)   07/04/23 76.2 kg (168 lb)   06/19/23 76.2 kg (168 lb)   03/06/23 83.6 kg (184 lb 3.2 oz)         05/02/2022     2:41 PM 07/02/2022    10:28 AM 09/03/2022     9:54 AM 12/03/2022     1:39 PM 03/06/2023    10:35 AM 07/29/2023    10:24 AM 02/11/2024     3:14 PM   Waist Circumference   Waist Circumference 41.5 inches 38.5 inches 38 inches 37.5 inches 33 inches 31 inches 30.25 inches            Update: Patient has gained 3 lbs since her last visit. She is pleased with her current weight and does not wish to lose any more.   Medication: Patient is taking Wegovy  1 mg qwk and metformin  500 mg BID, tolerating these well.    Eating Pattern:  - Fast food/Restaurants/Takeout: 2-4x per week.   - Breakfast: oatmeal/grits, bagels, greek yogurt, and fruit  - Lunch: salad, grilled chicken, and frozen meals  - Dinner: salad, grilled chicken, white rice, and brown rice  - Drinks: water, coffee, diet soda, occasional alcohol  - Snacks: fruit, vegetables, and nuts  Physical Activity:  walking and going to the gym for 30-45 mins, 4-5x per week.  Barrier: None discussed.     Lab Results   Component Value Date    A1C 4.9 07/29/2023    GLU 87 02/07/2022    CHOL 167 02/07/2022    LDL 95 02/07/2022    HDL 56 02/07/2022    TRIG 80 02/07/2022    AST 23 02/07/2022    ALT 24 02/07/2022    PLT 285 07/04/2023    TSH 1.103 02/07/2022     Past Medical/Surgical History:     Past Medical History[1]  Past Surgical History[2]    Allergies:     Patient has no known allergies.    Social History:     Social  History[3]    Family History:     Family History[4]    Medication list:     Current Medications[5]  Above medication list reviewed and updated.    ROS:     A 12 point review of systems was negative except for pertinent items noted in the HPI     Current Medications:     Current Medications[6]    I have reviewed and (if needed) updated the patient's problem list, medications, allergies, past medical and surgical history, social and family history.    Vital Signs:     Body mass index is 26.25 kg/m??. Waist Circumference: 30.25 inches    Wt Readings from Last 3 Encounters:   02/11/24 78.3 kg (172 lb 9.6 oz)   09/01/23 75.3 kg (166 lb)   07/29/23 76.8 kg (169 lb 6.4 oz)     Temp Readings from Last 3 Encounters:   09/01/23 36.6 ??C (97.9 ??F) (Temporal)   07/04/23 36.4 ??C (97.5 ??F)   06/19/23 36.8 ??C (98.2 ??F)     BP Readings from Last 3 Encounters:   02/11/24 146/89   09/01/23 134/86   07/29/23 146/94     Pulse Readings from Last 3 Encounters:   02/11/24 80   09/01/23 74   07/29/23 73        Physical Exam:     General: well appearing, in NAD, Body mass index is 26.25 kg/m??. Ambulatory without help.  Body fat distribution: General adiposity. No supraclavicular adiposity. No dorsal adiposity. Waist Circumference: 30.25 inches     Head: normocephalic atraumatic.  Eyes: PERRLA, EOMI, Sclera WNL.   Neck: supple, no LAD, no thyromegaly.  CV: RRR no rubs or murmurs.  Lungs: clear bilaterally to auscultation. No wheezing.  Extremities: no clubbing, cyanosis, No edema.  Skin: no concerning lesions, rashes observed. No lipomas  Neuro: Alert and oriented X 3.    Labs:     No visits with results within 1 Month(s) from this visit.   Latest known visit with results is:   Office Visit on 07/29/2023   Component Date Value Ref Range Status    Hemoglobin A1C 07/29/2023 4.9  4.0 - 6.0 % Final    A1C LOT NBR 07/29/2023 807,104   Final    A1C STRIP EXP 07/29/2023 05/14/2025   Final       Follow-up:     Return in about 3 months (around 05/13/2024) for Weight clinic F/U one slot..    I attest that I, Prentice Mayer, personally documented this note for [facility], MD.      Prentice Mayer  02/11/2024     I attest that I have reviewed the note and that the components of the history of the present illness, the physical exam, and the assessment and plan documented were performed by me or were performed in my presence where I verified the documentation and performed (or re-performed) the exam and medical decision making.     LAURAINE JINNY HUTCH, MD         [1]   Past Medical History:  Diagnosis Date    Essential hypertension 02/07/2022    Obesity [id number]   [2]   Past Surgical History:  Procedure Laterality Date    BREAST SURGERY  2016    IR EMBOLIZATION ORGAN ISCHEMIA, TUMORS, INFAR  07/04/2023    IR EMBOLIZATION ORGAN ISCHEMIA, TUMORS, INFAR 07/04/2023 Rosalita Meade Hadassah Chandra, MD IMG VIR [facility]   [3]   Social History  Socioeconomic History    Marital status: Married     Spouse name: None    Number of children: None    Years of education: None    Highest education level: None   Tobacco Use    Smoking status: Never    Smokeless tobacco: Never   Vaping Use    Vaping status: Never Used   Substance and Sexual Activity    Alcohol use: Yes     Alcohol/week: 2.0 standard drinks of alcohol     Types: 2 Glasses of wine per week     Comment: occasionally    Drug use: Never    Sexual activity: Yes     Partners: Male     Birth control/protection: OCP   [4] History reviewed. No pertinent family history.  [5]   Current Outpatient Medications:     adalimumab -bwwd (HADLIMA ,CF, PUSHTOUCH) 40 mg/0.4 mL AtIn, Inject the contents of 1 pen (40 mg total) under the skin every fourteen (14) days., Disp: 2.4 mL, Rfl: 5    adalimumab -bwwd (HADLIMA ,CF, PUSHTOUCH) 40 mg/0.4 mL AtIn, Inject the contents of 1 pen (40 mg) under the skin every fourteen (14) days., Disp: 0.8 mL, Rfl: 5    chlorthalidone  (HYGROTON ) 25 MG tablet, TAKE 1 TABLET BY MOUTH ONCE DAILY IN THE MORNING, Disp: 90 tablet, Rfl: 0    empty container Misc, Use as directed to dispose of injectable medications, Disp: 1 each, Rfl: 3    empty container Misc, use as directed, Disp: 1 each, Rfl: 2    ibuprofen (MOTRIN) 600 MG tablet, Take by mouth every hour as needed., Disp: , Rfl:     LARIN 24 FE 1 mg-20 mcg (24)/75 mg (4) Tab, Take 1 tablet by mouth daily., Disp: , Rfl:     metFORMIN  (GLUCOPHAGE -XR) 500 MG 24 hr tablet, Take 1 tablet (500 mg total) by mouth in the morning and 1 tablet (500 mg total) in the evening. Take with meals., Disp: 180 tablet, Rfl: 2    methylPREDNISolone  (MEDROL  DOSEPACK) 4 mg tablet, follow package directions, Disp: 21 each, Rfl: 0    multivitamin (TAB-A-VITE/THERAGRAN) per tablet, Take 1 tablet by mouth daily., Disp: , Rfl:     WEGOVY  1 MG/0.5 ML SUBCUTANEOUS PEN INJECTOR, Inject 1 mg under the skin every seven (7) days., Disp: 2 mL, Rfl: 3  [6]   Current Outpatient Medications   Medication Sig Dispense Refill    adalimumab -bwwd (HADLIMA ,CF, PUSHTOUCH) 40 mg/0.4 mL AtIn Inject the contents of 1 pen (40 mg total) under the skin every fourteen (14) days. 2.4 mL 5    adalimumab -bwwd (HADLIMA ,CF, PUSHTOUCH) 40 mg/0.4 mL AtIn Inject the contents of 1 pen (40 mg) under the skin every fourteen (14) days. 0.8 mL 5    chlorthalidone  (HYGROTON ) 25 MG tablet TAKE 1 TABLET BY MOUTH ONCE DAILY IN THE MORNING 90 tablet 0    empty container Misc Use as directed to dispose of injectable medications 1 each 3    empty container Misc use as directed 1 each 2    ibuprofen (MOTRIN) 600 MG tablet Take by mouth every hour as needed.      LARIN 24 FE 1 mg-20 mcg (24)/75 mg (4) Tab Take 1 tablet by mouth daily.      metFORMIN  (GLUCOPHAGE -XR) 500 MG 24 hr tablet Take 1 tablet (500 mg total) by mouth in the morning and 1 tablet (500 mg total) in the evening. Take with meals. 180 tablet 2 methylPREDNISolone  (MEDROL  DOSEPACK) 4 mg tablet  follow package directions 21 each 0    multivitamin (TAB-A-VITE/THERAGRAN) per tablet Take 1 tablet by mouth daily.      WEGOVY  1 MG/0.5 ML SUBCUTANEOUS PEN INJECTOR Inject 1 mg under the skin every seven (7) days. 2 mL 3     No current facility-administered medications for this visit.

## 2024-02-05 NOTE — Unmapped (Signed)
 Previous A1C of 4.9%. Mild acanthosis nigricans on PE. Reviewed relevant medications and labs. Patient is following our Weight Management Clinic. See Obesity tab for medication changes.    Lab Results   Component Value Date    A1C 4.9 07/29/2023    A1C 5.5 02/07/2022

## 2024-02-05 NOTE — Unmapped (Addendum)
 Peggy Rojas is a 43 y.o. 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     Comorbidities: HTN, insulin resistance, vitamin D def.   Barriers: nothing specific at this time.    Weight Summary:  Starting weight/BMI/WC/[place]: 257 lbs, BMI 40.82, WC 43, [place] 13 (02/07/2022)  Target weight/goal: No goal weight. Patient wants general improvement in health and appearance.  Today's weight/BMI: 78.3 kg (172 lb 9.6 oz), BMI 26.25 (02/11/2024)   % Body weight loss: 23.3%  Today's Waist Circumference: 30.25 inches (02/11/2024)    PCP is in [employer] through [employer] (private practice.)     Referred by: A friend who is also our patient.    GOALS: 1) Continue with healthy eating pattern and physical activity regimen!     I have reviewed the patient's medical history, lifestyle history and labs/tests.   My recommendations include the following:    Lifestyle Pattern Summary: See GOALS above.     Weight gain causing medication: None.    Medication: Patient is taking Wegovy  1 mg qwk and metformin  500 mg BID, tolerating these well. Discussed and decided to continue current regimen.     CONTINUE: Wegovy  1 mg qwk   CONTINUE: metformin  500 mg BID    Tried/Contraindicated:  - Phentermine: WL of 19 lbs, couldn't tolerate d/t headaches and insomnia    Sleep: Currently sleeping 7 hours without sleep aids. STOP-BANG negative (2/8).    Stress management: Patient reports having good stress management techniques such as reading.     Obesity Surgery: Patient is not eligible but would consider in the future if needed. Patient verbalized that if Wegovy  is not covered, she would like to seriously consider surgery. (Updated 02/07/2022)    MASLD/NAFLD Screening: (02/11/24)  Points <1.45: Cirrhosis less likely  Computed FIB-4 Calculation unavailable. One or more values for this score either were not found within the given timeframe or did not fit some other criterion.    Medical conditions:  Glaucoma: no  Seizures: no  Medullary thyroid cancer (personal or family history): no  Multiple Endocrine Neoplasia: no  Palpitations/Tachycardia: no  Chest Pain: no past history of MI.   Headaches/Migraines: no  Nephrolithiasis: no  H/o pancreatitis: no  GERD: no     Prior Surgeries:  Cholecystectomy: no  Hysterectomy: no     Birth Control Methods: OCP

## 2024-02-11 ENCOUNTER — Encounter
Admit: 2024-02-11 | Discharge: 2024-02-11 | Payer: BLUE CROSS/BLUE SHIELD | Attending: Family Medicine | Primary: Family Medicine

## 2024-02-11 DIAGNOSIS — E88819 Insulin resistance: Principal | ICD-10-CM

## 2024-02-11 DIAGNOSIS — E663 Overweight: Principal | ICD-10-CM

## 2024-02-11 DIAGNOSIS — I10 Essential (primary) hypertension: Principal | ICD-10-CM

## 2024-02-11 LAB — COMPREHENSIVE METABOLIC PANEL
ALBUMIN: 3.8 g/dL (ref 3.4–5.0)
ALKALINE PHOSPHATASE: 36 U/L — ABNORMAL LOW (ref 46–116)
ALT (SGPT): 7 U/L — ABNORMAL LOW (ref 10–49)
ANION GAP: 14 mmol/L (ref 5–14)
AST (SGOT): 12 U/L (ref <=34)
BILIRUBIN TOTAL: 0.5 mg/dL (ref 0.3–1.2)
BLOOD UREA NITROGEN: 11 mg/dL (ref 9–23)
BUN / CREAT RATIO: 15
CALCIUM: 9.3 mg/dL (ref 8.7–10.4)
CHLORIDE: 102 mmol/L (ref 98–107)
CO2: 24.5 mmol/L (ref 20.0–31.0)
CREATININE: 0.71 mg/dL (ref 0.55–1.02)
EGFR CKD-EPI (2021) FEMALE: >90 mL/min/1.73m2 (ref >=60)
GLUCOSE RANDOM: 118 mg/dL — ABNORMAL HIGH (ref 70–99)
POTASSIUM: 3.6 mmol/L (ref 3.4–4.8)
PROTEIN TOTAL: 7.2 g/dL (ref 5.7–8.2)
SODIUM: 140 mmol/L (ref 135–145)

## 2024-02-11 LAB — CBC W/ AUTO DIFF
BASOPHILS ABSOLUTE COUNT: 0.1 10*9/L (ref 0.0–0.1)
BASOPHILS RELATIVE PERCENT: 0.9 %
EOSINOPHILS ABSOLUTE COUNT: 0.1 10*9/L (ref 0.0–0.5)
EOSINOPHILS RELATIVE PERCENT: 1.2 %
HEMATOCRIT: 38.8 % (ref 34.0–44.0)
HEMOGLOBIN: 13.1 g/dL (ref 11.3–14.9)
LYMPHOCYTES ABSOLUTE COUNT: 2.3 10*9/L (ref 1.1–3.6)
LYMPHOCYTES RELATIVE PERCENT: 40.8 %
MEAN CORPUSCULAR HEMOGLOBIN CONC: 33.8 g/dL (ref 32.0–36.0)
MEAN CORPUSCULAR HEMOGLOBIN: 28.1 pg (ref 25.9–32.4)
MEAN CORPUSCULAR VOLUME: 83.1 fL (ref 77.6–95.7)
MEAN PLATELET VOLUME: 8 fL (ref 6.8–10.7)
MONOCYTES ABSOLUTE COUNT: 0.3 10*9/L (ref 0.3–0.8)
MONOCYTES RELATIVE PERCENT: 4.5 %
NEUTROPHILS ABSOLUTE COUNT: 3 10*9/L (ref 1.8–7.8)
NEUTROPHILS RELATIVE PERCENT: 52.6 %
NUCLEATED RED BLOOD CELLS: 0 /100{WBCs} (ref <=4)
PLATELET COUNT: 289 10*9/L (ref 150–450)
RED BLOOD CELL COUNT: 4.67 10*12/L (ref 3.95–5.13)
RED CELL DISTRIBUTION WIDTH: 14.5 % (ref 12.2–15.2)
WBC ADJUSTED: 5.8 10*9/L (ref 3.6–11.2)

## 2024-02-11 MED ORDER — WEGOVY 1 MG/0.5 ML SUBCUTANEOUS PEN INJECTOR
SUBCUTANEOUS | 3 refills | 28.00000 days | Status: CP
Start: 2024-02-11 — End: ?
  Filled 2024-02-27: qty 2, 28d supply, fill #0

## 2024-02-11 NOTE — Unmapped (Addendum)
 BP is slightly elevated. Follow closely with PCP. We will continue to work on [employer].  Advised patient to schedule a visit with PCP to address further- she is on no HTN medications. Reviewed relevant medications and labs. Patient is following our Weight Management Clinic. See Obesity tab for medication changes.    BP Readings from Last 3 Encounters:   02/11/24 146/89   09/01/23 134/86   07/29/23 146/94

## 2024-02-13 MED ORDER — HADLIMA(CF) PUSHTOUCH 40 MG/0.4 ML SUBCUTANEOUS AUTO-INJECTOR
5 refills | 0.00000 days
Start: 2024-02-13 — End: ?

## 2024-02-19 NOTE — Unmapped (Signed)
 [facility] Specialty and Home Delivery Pharmacy Refill Coordination Note    Specialty Medication(s) to be Shipped:   Inflammatory Disorders: Hadlima     Other medication(s) to be shipped: No additional medications requested for fill at this time     Peggy Rojas, DOB: 08/03/1980  Phone: [phone] (home)       All above HIPAA information was verified with patient.     Was a Nurse, learning disability used for this call? No    Completed refill call assessment today to schedule patient's medication shipment from the [facility] and Home Delivery Pharmacy  [phone]).  All relevant notes have been reviewed.     Specialty medication(s) and dose(s) confirmed: Regimen is correct and unchanged.   Changes to medications: Peggy Rojas reports no changes at this time.  Changes to insurance: No  New side effects reported not previously addressed with a pharmacist or physician: None reported  Questions for the pharmacist: No    Confirmed patient received a Conservation officer, historic buildings and a Surveyor, mining with first shipment. The patient will receive a drug information handout for each medication shipped and additional FDA Medication Guides as required.       DISEASE/MEDICATION-SPECIFIC INFORMATION        For patients on injectable medications: Patient currently has 0 doses left.  Next injection is scheduled for 01/29/24.    SPECIALTY MEDICATION ADHERENCE     Medication Adherence    Patient reported X missed doses in the last month: 2  Specialty Medication: Hadlima (CF) PUSHTOUCH 40 mg/0.4 mL  Patient is on additional specialty medications: No  Informant: patient     Were doses missed due to medication being on hold? No    Hadlima  40mg /0.81mL: 0 doses of medicine on hand       REFERRAL TO PHARMACIST     Referral to the pharmacist: Not needed      [facility]     Shipping address confirmed in Epic.     Cost and Payment: Patient has a $0 copay, payment information is not required.    Delivery Scheduled: Yes, Expected medication delivery date: 02/20/24.     Medication will be delivered via Same Day Courier to the prescription address in Epic [place].    Peggy Rojas   [facility]  Specialty Technician

## 2024-02-19 NOTE — Unmapped (Signed)
 This pharmacist was notified by a technician that this patient has reported that they've missed 2 doses of their Hadlima.. I have reviewed the patient's medical record and have determined that no further pharmacist action is needed.      Approximate time spent: 0-5 minutes    Peggy Rojas, PharmD, Clinical Specialty Pharmacist  [facility] Specialty and Home Delivery Pharmacy

## 2024-02-20 MED FILL — HADLIMA(CF) PUSHTOUCH 40 MG/0.4 ML SUBCUTANEOUS AUTO-INJECTOR: SUBCUTANEOUS | 28 days supply | Qty: 0.8 | Fill #0

## 2024-03-05 NOTE — Unmapped (Signed)
 [facility] Specialty and Home Delivery Pharmacy Refill Coordination Note    Specialty Medication(s) to be Shipped:   Inflammatory Disorders: Hadlima     Other medication(s) to be shipped: No additional medications requested for fill at this time    Specialty Medications not needed at this time: N/A     [place] Peggy Rojas, DOB: 1981-01-28  Phone: [phone] (home)       All above HIPAA information was verified with patient.     Was a Nurse, learning disability used for this call? No    Completed refill call assessment today to schedule patient's medication shipment from the [facility] and Home Delivery Pharmacy  [phone]).  All relevant notes have been reviewed.     Specialty medication(s) and dose(s) confirmed: Regimen is correct and unchanged.   Changes to medications: Peggy Rojas reports no changes at this time.  Changes to insurance: No  New side effects reported not previously addressed with a pharmacist or physician: None reported  Questions for the pharmacist: No    Confirmed patient received a Conservation officer, historic buildings and a Surveyor, mining with first shipment. The patient will receive a drug information handout for each medication shipped and additional FDA Medication Guides as required.       DISEASE/MEDICATION-SPECIFIC INFORMATION        For patients on injectable medications: Next injection is scheduled for 03/19/2024.    SPECIALTY MEDICATION ADHERENCE     Medication Adherence    Specialty Medication: HADLIMA (CF) PUSHTOUCH 40 mg/0.4 mL Atin (adalimumab -bwwd)  Patient is on additional specialty medications: No              Were doses missed due to medication being on hold? No      HADLIMA (CF) PUSHTOUCH 40 mg/0.4 mL Atin (adalimumab -bwwd): 0 doses of medicine on hand       REFERRAL TO PHARMACIST     Referral to the pharmacist: Not needed      SHIPPING     Shipping address confirmed in Epic.     Cost and Payment: Patient has a $0 copay, payment information is not required.    Delivery Scheduled: Yes, Expected medication delivery date: 03/17/2024.     Medication will be delivered via Same Day Courier to the prescription address in Epic [place].    [facility] Special[facility] Delivery Pharmacy  Specialty Technician

## 2024-03-17 MED FILL — HADLIMA(CF) PUSHTOUCH 40 MG/0.4 ML SUBCUTANEOUS AUTO-INJECTOR: SUBCUTANEOUS | 28 days supply | Qty: 0.8 | Fill #1

## 2024-03-17 MED FILL — METFORMIN ER 500 MG TABLET,EXTENDED RELEASE 24 HR: ORAL | 90 days supply | Qty: 180 | Fill #1

## 2024-03-30 MED FILL — WEGOVY 1 MG/0.5 ML SUBCUTANEOUS PEN INJECTOR: SUBCUTANEOUS | 28 days supply | Qty: 2 | Fill #1

## 2024-04-09 DIAGNOSIS — H209 Unspecified iridocyclitis: Principal | ICD-10-CM

## 2024-04-09 DIAGNOSIS — D869 Sarcoidosis, unspecified: Principal | ICD-10-CM

## 2024-04-14 NOTE — Unmapped (Signed)
 [facility] Specialty and Home Delivery Pharmacy Refill Coordination Note    Specialty Medication(s) to be Shipped:   Inflammatory Disorders: Hadlima     Other medication(s) to be shipped: No additional medications requested for fill at this time    Specialty Medications not needed at this time: N/A     Peggy Rojas, DOB: Oct 19, 1980  Phone: [phone] (home)       All above HIPAA information was verified with patient.     Was a Nurse, learning disability used for this call? No    Completed refill call assessment today to schedule patient's medication shipment from the [facility] and Home Delivery Pharmacy  [phone]).  All relevant notes have been reviewed.     Specialty medication(s) and dose(s) confirmed: Regimen is correct and unchanged.   Changes to medications: Peggy Rojas reports no changes at this time.  Changes to insurance: No  New side effects reported not previously addressed with a pharmacist or physician: None reported  Questions for the pharmacist: No    Confirmed patient received a Conservation officer, historic buildings and a Surveyor, mining with first shipment. The patient will receive a drug information handout for each medication shipped and additional FDA Medication Guides as required.       DISEASE/MEDICATION-SPECIFIC INFORMATION        For patients on injectable medications: Next injection is scheduled for 04/16/24.    SPECIALTY MEDICATION ADHERENCE     Medication Adherence    Patient reported X missed doses in the last month: 0  Specialty Medication: HADLIMA (CF) PUSHTOUCH 40 mg/0.4 mL Atin (adalimumab -bwwd)  Patient is on additional specialty medications: No  Patient is on more than two specialty medications: No  Any gaps in refill history greater than 2 weeks in the last 3 months: no  Demonstrates understanding of importance of adherence: yes              Were doses missed due to medication being on hold? No    HADLIMA (CF) PUSHTOUCH 40   mg/ml: 0 days of medicine on hand       REFERRAL TO PHARMACIST     Referral to the pharmacist: Not needed      [facility]     Shipping address confirmed in Epic.     Cost and Payment: Patient has a $0 copay, payment information is not required.    Delivery Scheduled: Yes, Expected medication delivery date: 04/15/24.     Medication will be delivered via Same Day Courier to the prescription address in Epic [place].    Peggy Rojas   [facility] Specialty and Home Delivery Pharmacy  Specialty Technician

## 2024-04-15 MED FILL — HADLIMA(CF) PUSHTOUCH 40 MG/0.4 ML SUBCUTANEOUS AUTO-INJECTOR: SUBCUTANEOUS | 28 days supply | Qty: 0.8 | Fill #2

## 2024-04-24 MED ORDER — EMPTY CONTAINER
3 refills | 0.00000 days
Start: 2024-04-24 — End: ?

## 2024-04-25 MED FILL — EMPTY CONTAINER: 120 days supply | Qty: 1 | Fill #0

## 2024-04-25 MED FILL — WEGOVY 1 MG/0.5 ML SUBCUTANEOUS PEN INJECTOR: SUBCUTANEOUS | 28 days supply | Qty: 2 | Fill #2

## 2024-04-28 MED ORDER — FLULAVAL 2025-2026 (PF) 45 MCG (15 MCG X 3)/0.5 ML IM SYRINGE
Freq: Once | INTRAMUSCULAR | 0 refills | 1.00000 days
Start: 2024-04-28 — End: 2024-04-29

## 2024-04-28 MED FILL — FLULAVAL 2025-2026 (PF) 45 MCG (15 MCG X 3)/0.5 ML IM SYRINGE: INTRAMUSCULAR | 1 days supply | Qty: 0.5 | Fill #0

## 2024-05-03 NOTE — Unmapped (Signed)
 [facility] Clinical Assessment & Refill Coordination Note    Peggy Rojas, DOB: 1980-12-30  Phone: [phone] (home)     All above HIPAA information was verified with patient.     Was a Nurse, learning disability used for this call? No    Specialty Medication(s):   Inflammatory Disorders: Hadlima      Current Medications[1]     Changes to medications: Elsa reports no changes at this time.    Medication list has been reviewed and updated in Epic: Yes    Allergies[2]    Changes to allergies: No    Allergies have been reviewed and updated in Epic: Yes    SPECIALTY MEDICATION ADHERENCE     Hadlima  (CF) Pushtouch 40mg /0.39mL : 0 doses of medicine on hand     Medication Adherence    Patient reported X missed doses in the last month: 0  Specialty Medication: Hadlima  (CF) Pushtouch 40mg /0.20mL  Informant: patient  Confirmed plan for next specialty medication refill: delivery by pharmacy  Refills needed for supportive medications: not needed          Specialty medication(s) dose(s) confirmed: Regimen is correct and unchanged.     Are there any concerns with adherence? No    Adherence counseling provided? Not needed    CLINICAL MANAGEMENT AND INTERVENTION      Clinical Benefit Assessment:    Do you feel the medicine is effective or helping your condition? Yes    Clinical Benefit counseling provided? Not needed    Adverse Effects Assessment:    Are you experiencing any side effects? No    Are you experiencing difficulty administering your medicine? No    Quality of Life Assessment:    Quality of Life    Rheumatology  1. What impact has your specialty medication had on the reduction of your daily pain level?: Some  2. What impact has your specialty medication had on your ability to complete daily tasks (prepare meals, get dressed, etc...)?: Some  Oncology  Dermatology  Cystic Fibrosis          How many days over the past month did your condition  keep you from your normal activities? For example, brushing your teeth or getting up in the morning. 0    Have you discussed this with your provider? Not needed    Acute Infection Status:    Acute infections noted within Epic:  No active infections    Patient reported infection: None    Therapy Appropriateness:    Is the medication and dose appropriate considering the patient???s diagnosis, treatment, and disease journey, comorbidities, medical history, current medications, allergies, therapeutic goals, self-administration ability, and access barriers? Yes, therapy is appropriate and should be continued     Clinical Intervention:    Was an intervention completed as part of this clinical assessment? No    DISEASE/MEDICATION-SPECIFIC INFORMATION      For patients on injectable medications: Next injection is scheduled for 05/09/2024.    Chronic Inflammatory Diseases: Have you experienced any flares in the last month? No  Has this been reported to your provider? No    PATIENT SPECIFIC NEEDS     Does the patient have any physical, cognitive, or cultural barriers? No    Is the patient high risk? No    Does the patient require physician intervention or other additional services (i.e., nutrition, smoking cessation, social work)? No    Does the patient have an additional or emergency contact listed in their chart?  Yes    SOCIAL DETERMINANTS OF HEALTH     At [facility], we have learned that life circumstances - like trouble affording food, housing, utilities, or transportation can affect the health of many of our patients.   That is why we wanted to ask: are you currently experiencing any life circumstances that are negatively impacting your health and/or quality of life? No    Social Drivers of Health     Food Insecurity: Not on file   Tobacco Use: Low Risk  (02/11/2024)    Patient History     Smoking Tobacco Use: Never     Smokeless Tobacco Use: Never     Passive Exposure: Not on file   Transportation Needs: Not on file   Alcohol Use: Not on file   Housing: Not on file   Physical Activity: Not on file   Utilities: Not on file   Stress: Not on file   Interpersonal Safety: Not on file   Substance Use: Not on file (05/24/2023)   Intimate Partner Violence: Not on file   Social Connections: Not on file   Financial Resource Strain: Not on file   Health Literacy: Not on file   Internet Connectivity: Not on file       Would you be willing to receive help with any of the needs that you have identified today? No       SHIPPING     Specialty Medication(s) to be Shipped:   Inflammatory Disorders: Hadlima     Other medication(s) to be shipped: No additional medications requested for fill at this time    Specialty Medications not needed at this time: Specialty Lite: Wegovy      Changes to insurance: No    Cost and Payment: Patient has a $0 copay, payment information is not required.    Delivery Scheduled: Yes, Expected medication delivery date: 05/07/2024.     Medication will be delivered via Same Day Courier to the confirmed prescription address in [facility].    The patient will receive a drug information handout for each medication shipped and additional FDA Medication Guides as required.  Verified that patient has previously received a Conservation officer, historic buildings and a Surveyor, mining.    The patient or caregiver noted above participated in the development of this care plan and knows that they can request review of or adjustments to the care plan at any time.      All of the patient's questions and concerns have been addressed.    Velma Ober, PharmD   [facility] Specialty Pharmacist       [1]   Current Outpatient Medications   Medication Sig Dispense Refill    adalimumab -bwwd (HADLIMA ,CF, PUSHTOUCH) 40 mg/0.4 mL AtIn Inject the contents of 1 pen (40 mg) under the skin every fourteen (14) days. 1.6 mL 5    chlorthalidone  (HYGROTON ) 25 MG tablet TAKE 1 TABLET BY MOUTH ONCE DAILY IN THE MORNING 90 tablet 0    empty container Misc use as directed 1 each 2 empty container Misc Use as directed to dispose of injectable medications 1 each 3    ibuprofen (MOTRIN) 600 MG tablet Take by mouth every hour as needed.      LARIN 24 FE 1 mg-20 mcg (24)/75 mg (4) Tab Take 1 tablet by mouth daily.      metFORMIN  (GLUCOPHAGE -XR) 500 MG 24 hr tablet Take 1 tablet (500 mg total) by mouth in the morning and  1 tablet (500 mg total) in the evening. Take with meals. 180 tablet 2    methylPREDNISolone  (MEDROL  DOSEPACK) 4 mg tablet follow package directions 21 each 0    multivitamin (TAB-A-VITE/THERAGRAN) per tablet Take 1 tablet by mouth daily.      WEGOVY  1 MG/0.5 ML SUBCUTANEOUS PEN INJECTOR Inject 1 mg under the skin every seven (7) days. 2 mL 3     No current facility-administered medications for this visit.   [2] No Known Allergies

## 2024-05-07 MED FILL — HADLIMA(CF) PUSHTOUCH 40 MG/0.4 ML SUBCUTANEOUS AUTO-INJECTOR: SUBCUTANEOUS | 28 days supply | Qty: 0.8 | Fill #3

## 2024-05-17 ENCOUNTER — Ambulatory Visit: Payer: Self-pay

## 2024-05-17 ENCOUNTER — Ambulatory Visit (INDEPENDENT_AMBULATORY_CARE_PROVIDER_SITE_OTHER): Admitting: Physician Assistant

## 2024-05-17 VITALS — BP 147/96 | HR 76 | Resp 14 | Ht 68.0 in | Wt 173.4 lb

## 2024-05-17 DIAGNOSIS — R42 Dizziness and giddiness: Secondary | ICD-10-CM

## 2024-05-17 DIAGNOSIS — H538 Other visual disturbances: Secondary | ICD-10-CM | POA: Diagnosis not present

## 2024-05-17 DIAGNOSIS — G4489 Other headache syndrome: Secondary | ICD-10-CM

## 2024-05-17 MED ORDER — CHLORTHALIDONE 25 MG PO TABS: 25.0000 mg | ORAL_TABLET | Freq: Every day | ORAL | 0 refills | Status: DC

## 2024-05-17 NOTE — Telephone Encounter (Signed)
 FYI Only or Action Required?: FYI only for provider: appointment scheduled on 05/17/24.  Patient was last seen in primary care on 08/27/2022 by Ostwalt, Janna, [name].  Called Nurse Triage reporting Dizziness, Nausea, Blurred Vision, and Headache.  Symptoms began several days ago.  Interventions attempted: OTC medications: ibuprofen  and Prescription medications: Zofran .  Symptoms are: headache, blurred vision, nausea, dizziness completely resolved.  Triage Disposition: See Physician Within 24 Hours  Patient/caregiver understands and will follow disposition?: Yes         Copied from CRM [id number]. Topic: Clinical - Red Word Triage >> May 17, 2024  8:46 AM Rea BROCKS wrote: Red Word that prompted transfer to Nurse Triage: Light headed (vision issues)/nausea/dizziness- started over the weekend. Not sure if it is blood pressure concerns. Reason for Disposition  [1] MODERATE dizziness (e.g., interferes with normal activities) AND [2] has NOT been evaluated by doctor (or NP/PA) for this  (Exception: Dizziness caused by heat exposure, sudden standing, or poor fluid intake.)  Answer Assessment - Initial Assessment Questions 1. SYMPTOM: What is the main symptom you are concerned about? (e.g., weakness, numbness)     Headache, dizziness, blurred vision.  2. ONSET: When did this start? (e.g., minutes, hours, days; while sleeping)     Saturday 2-3 am.  3. LAST NORMAL: When was the last time you (the patient) were normal (no symptoms)?     Friday night around 10-11pm.  4. PATTERN Does this come and go, or has it been constant since it started?  Is it present now?     Comes and goes, not present now.  5. CARDIAC SYMPTOMS: Have you had any of the following symptoms: chest pain, difficulty breathing, palpitations?     No.  6. NEUROLOGIC SYMPTOMS: Have you had any of the following symptoms: headache, dizziness, vision loss, double vision, changes in speech, unsteady on your feet?      Denies unilateral numbness or weakness, vision loss or double vision, changes in speech. Denies any issues with blood pressure that she is aware.  7. OTHER SYMPTOMS: Do you have any other symptoms?     Nausea intermittent (resolved today, last felt nauseated yesterday).  8. PREGNANCY: Is there any chance you are pregnant? When was your last menstrual period?     LMP: 05/14/24.  Protocols used: Neurologic Deficit-A-AH, Dizziness - Lightheadedness-A-AH

## 2024-05-17 NOTE — Progress Notes (Signed)
 " Established patient visit  Patient: Marie Moran   DOB: 26-Mar-1981   43 y.o. Female  MRN: [id number] Visit Date: 05/17/2024  Today's healthcare provider: Jolynn Spencer, [name]   Chief Complaint  Patient presents with   Headache    Nausea, dizzy, blurry vision otc: ibuprofen , aleve . Had some left over nausea medication. ongoing 2 days symptoms started when sleeping headache woke pt up. Pt drove here   Subjective     HPI     Headache    Additional comments: Nausea, dizzy, blurry vision otc: ibuprofen , aleve . Had some left over nausea medication. ongoing 2 days symptoms started when sleeping headache woke pt up. Pt drove here      Last edited by Wilfred Hargis RAMAN, CMA on 05/17/2024  4:05 PM.       Discussed the use of AI scribe software for clinical note transcription with the patient, who gave verbal consent to proceed.  History of Present Illness Marie Moran is a 43 year old female with sarcoidosis who presents with dizziness, headache, and blurry vision.  Dizziness, nausea, and blurry vision have been present for two days. Dizziness occurs particularly when standing or getting up. Blurry vision coincides with headaches and improves as the headache subsides.  A severe, throbbing headache began suddenly, waking her from sleep between 2 and 3 AM on Saturday. The headache is rated 8 or 9 out of 10 in severity, located unilaterally, and radiates to the back. It improves with ibuprofen  and Tylenol . There is no history of recent trauma or similar headaches.  Current medications include Hadlima for sarcoidosis, Loryna, metformin, and birth control. She has a history of elevated blood pressure, which improved with weight loss. She previously took chlorthalidone  for hypertension but discontinued it about a year ago. Her blood pressure was elevated during her last visit with her weight loss doctor.  There is no recent illness, upper respiratory infection, or sinus problems. She denies  chest pain, shortness of breath, weakness, tingling, numbness, or fever. Anxiety is present regarding the visit, but she denies depression. She participates in a nutritionist program through her employer and has lost significant weight, improving her blood pressure.  Sarcoidosis symptoms typically include body aches resembling flu-like symptoms during flare-ups, but she is not currently experiencing these symptoms. She has been taking Hadlima for sarcoidosis for about two years without issues.       05/17/2024    4:06 PM 08/27/2022    2:19 PM  Depression screen PHQ 2/9  Decreased Interest 0 1  Down, Depressed, Hopeless 0 0  PHQ - 2 Score 0 1  Altered sleeping  0  Tired, decreased energy  0  Change in appetite  0  Feeling bad or failure about yourself   0  Trouble concentrating  0  Moving slowly or fidgety/restless  0  Suicidal thoughts  0  PHQ-9 Score  1  Difficult doing work/chores  Not difficult at all       No data to display          Medications: Outpatient Medications Prior to Visit  Medication Sig   Adalimumab-bwwd (HADLIMA PUSHTOUCH) 40 MG/0.4ML SOAJ Inject into the skin.   chlorthalidone  (HYGROTON ) 25 MG tablet Take 25 mg by mouth every morning.   ibuprofen  (ADVIL ) 800 MG tablet Take 1 tablet (800 mg total) by mouth every 8 (eight) hours as needed.   LARIN  24 FE 1-20 MG-MCG(24) tablet Take 1 tablet by mouth daily.   metFORMIN (GLUCOPHAGE-XR) 500 MG  24 hr tablet Take 500 mg by mouth 2 (two) times daily.   Multiple Vitamin (MULTI-VITAMIN) tablet Take 1 tablet by mouth daily.   Semaglutide-Weight Management (WEGOVY) 1 MG/0.5ML SOAJ Inject into the skin.   Semaglutide-Weight Management (WEGOVY) 1.7 MG/0.75ML SOAJ Inject into the skin.   Sharps Container (GUARDIAN SHARPS COLLECTOR) MISC Use as directed to dispose of injectable medications   No facility-administered medications prior to visit.    Review of Systems All negative Except see HPI       Objective     BP (!) 147/96 (BP Location: Right Arm, Cuff Size: Large)   Pulse 76   Resp 14   Ht 5' 8 (1.727 m)   Wt 173 lb 6.4 oz (78.7 kg)   LMP 05/14/2024   SpO2 100%   BMI 26.37 kg/m     Physical Exam Vitals reviewed.  Constitutional:      General: She is not in acute distress.    Appearance: Normal appearance. She is well-developed. She is not diaphoretic.  HENT:     Head: Normocephalic and atraumatic.  Eyes:     General: No visual field deficit or scleral icterus.    Conjunctiva/sclera: Conjunctivae normal.  Neck:     Thyroid: No thyromegaly.  Cardiovascular:     Rate and Rhythm: Normal rate and regular rhythm.     Pulses: Normal pulses.     Heart sounds: Normal heart sounds. No murmur heard. Pulmonary:     Effort: Pulmonary effort is normal. No respiratory distress.     Breath sounds: Normal breath sounds. No wheezing, rhonchi or rales.  Musculoskeletal:     Cervical back: Neck supple.     Right lower leg: No edema.     Left lower leg: No edema.  Lymphadenopathy:     Cervical: No cervical adenopathy.  Skin:    General: Skin is warm and dry.     Findings: No rash.  Neurological:     Mental Status: She is alert and oriented to person, place, and time. Mental status is at baseline.     Cranial Nerves: No cranial nerve deficit, dysarthria or facial asymmetry.     Sensory: No sensory deficit.     Motor: No weakness.     Coordination: Coordination normal.     Gait: Gait normal.     Deep Tendon Reflexes: Reflexes normal.  Psychiatric:        Mood and Affect: Mood normal.        Speech: Speech normal.        Behavior: Behavior normal.      No results found for any visits on 05/17/24.      Assessment & Plan Dizziness, headache, and blurry vision Acute onset of severe unilateral hipnic headache with blurry vision. Symptoms improved today. Differential includes anxiety-related symptoms and possible neurological causes. Normal neuro exam . Elevated BP> Imaging considered  due to severity of symptoms in the setting of sarcoidosis - Ordered blood work to rule out underlying causes. - Advised contacting ophthalmologist for evaluation of blurry vision. - Instructed to seek emergency care if symptoms worsen or recur.  Elevated blood pressure Blood pressure elevated, possibly due to recent weight loss and discontinuation of antihypertensive medication. Previous hypertension managed with chlorthalidone . - Restarted chlorthalidone  at half dose and instructed to monitor blood pressure. - Advised to increase dose if blood pressure remains uncontrolled. - Scheduled follow-up in two weeks to reassess blood pressure management.  Sarcoidosis Managed with Hadlima. No recent flare-ups or  changes in symptoms. - Continue current management with Hadlima.  Dizziness (Primary) With nausea and blurry vision - EKG 12-Lead showed NSR - Comprehensive Metabolic Panel (CMET) - CBC w/Diff/Platelet - TSH - Sed Rate (ESR) - C-reactive protein - chlorthalidone  (HYGROTON ) 25 MG tablet; Take 1 tablet (25 mg total) by mouth daily.  Dispense: 90 tablet; Refill: 0 - MR Brain W Wo Contrast  Other headache syndrome  - MR Brain W Wo Contrast  Blurry vision, bilateral  - MR Brain W Wo Contrast   Orders Placed This Encounter  Procedures   MR Brain W Wo Contrast    If indicated for the ordered procedure, I authorize the administration of contrast media per Radiology protocol:   Yes    What is the patient's sedation requirement?:   No Sedation    Does the patient have a pacemaker or implanted devices?:   No    Preferred imaging location?:   [facility] (table limit-350lbs)   Comprehensive Metabolic Panel (CMET)   CBC w/Diff/Platelet   TSH   Sed Rate (ESR)   C-reactive protein   EKG 12-Lead    Return in about 2 weeks (around 05/31/2024) for BP f/u.   The patient was advised to call back or seek an in-person evaluation if the symptoms worsen or if the condition fails to  improve as anticipated.  I discussed the assessment and treatment plan with the patient. The patient was provided an opportunity to ask questions and all were answered. The patient agreed with the plan and demonstrated an understanding of the instructions.  I, Haisley Arens, [name] have reviewed all documentation for this visit. The documentation on 05/17/2024  for the exam, diagnosis, procedures, and orders are all accurate and complete.  Jolynn Spencer, [facility], MMS [employer] [phone] (phone) [phone] (fax)  [facility] "

## 2024-05-18 ENCOUNTER — Telehealth: Payer: Self-pay

## 2024-05-18 NOTE — Telephone Encounter (Signed)
 Spoke with pt to advised a stat referral for MRI was placed and she would get a call. Pt stated she had scheduled appt for 05/19/24 at 3 PM. During call pt asked did she actually need to have MRI. I spoke with provider and advised due to symptoms that were presented during OV it had raced some concerns but pt could refuse MRI if she wanted too. It would be pt decision, pt then stated Thank you and stated she would stop by office today to have blood work done. Was not able to do yesterday as lab was closed

## 2024-05-19 LAB — COMPREHENSIVE METABOLIC PANEL WITH GFR
ALT: 9 IU/L (ref 0–32)
AST: 12 IU/L (ref 0–40)
Albumin: 4.7 g/dL (ref 3.9–4.9)
Alkaline Phosphatase: 46 IU/L (ref 41–116)
BUN/Creatinine Ratio: 15 (ref 9–23)
BUN: 13 mg/dL (ref 6–24)
Bilirubin Total: 0.3 mg/dL (ref 0.0–1.2)
CO2: 24 mmol/L (ref 20–29)
Calcium: 10.5 mg/dL — ABNORMAL HIGH (ref 8.7–10.2)
Chloride: 99 mmol/L (ref 96–106)
Creatinine, Ser: 0.86 mg/dL (ref 0.57–1.00)
Globulin, Total: 2.9 g/dL (ref 1.5–4.5)
Glucose: 107 mg/dL — ABNORMAL HIGH (ref 70–99)
Potassium: 3.7 mmol/L (ref 3.5–5.2)
Sodium: 138 mmol/L (ref 134–144)
Total Protein: 7.6 g/dL (ref 6.0–8.5)
eGFR: 85 mL/min/1.73 (ref >59)

## 2024-05-19 LAB — SEDIMENTATION RATE: Sed Rate: 7 mm/h (ref 0–32)

## 2024-05-19 LAB — CBC WITH DIFFERENTIAL/PLATELET
Basophils Absolute: 0.1 x10E3/uL (ref 0.0–0.2)
Basos: 1 %
EOS (ABSOLUTE): 0 x10E3/uL (ref 0.0–0.4)
Eos: 1 %
Hematocrit: 46.3 % (ref 34.0–46.6)
Hemoglobin: 15 g/dL (ref 11.1–15.9)
Immature Grans (Abs): 0 x10E3/uL (ref 0.0–0.1)
Immature Granulocytes: 0 %
Lymphocytes Absolute: 2.3 x10E3/uL (ref 0.7–3.1)
Lymphs: 45 %
MCH: 27.8 pg (ref 26.6–33.0)
MCHC: 32.4 g/dL (ref 31.5–35.7)
MCV: 86 fL (ref 79–97)
Monocytes Absolute: 0.2 x10E3/uL (ref 0.1–0.9)
Monocytes: 5 %
Neutrophils Absolute: 2.5 x10E3/uL (ref 1.4–7.0)
Neutrophils: 48 %
Platelets: 339 x10E3/uL (ref 150–450)
RBC: 5.4 x10E6/uL — ABNORMAL HIGH (ref 3.77–5.28)
RDW: 13.2 % (ref 11.7–15.4)
WBC: 5.1 x10E3/uL (ref 3.4–10.8)

## 2024-05-19 LAB — C-REACTIVE PROTEIN: CRP: 3 mg/L (ref 0–10)

## 2024-05-19 LAB — TSH: TSH: 0.703 u[IU]/mL (ref 0.450–4.500)

## 2024-05-20 ENCOUNTER — Ambulatory Visit: Payer: Self-pay | Admitting: Physician Assistant

## 2024-05-20 MED FILL — WEGOVY 1 MG/0.5 ML SUBCUTANEOUS PEN INJECTOR: SUBCUTANEOUS | 28 days supply | Qty: 2 | Fill #3

## 2024-06-01 ENCOUNTER — Encounter: Payer: Self-pay | Admitting: Physician Assistant

## 2024-06-01 ENCOUNTER — Ambulatory Visit: Admitting: Physician Assistant

## 2024-06-01 VITALS — BP 130/90 | HR 92 | Ht 68.0 in | Wt 170.8 lb

## 2024-06-01 DIAGNOSIS — I10 Essential (primary) hypertension: Secondary | ICD-10-CM | POA: Diagnosis not present

## 2024-06-01 MED ORDER — VALSARTAN 40 MG PO TABS: 40.0000 mg | ORAL_TABLET | Freq: Every day | ORAL | 3 refills | Status: AC

## 2024-06-01 NOTE — Progress Notes (Signed)
 Established patient visit  Patient: Marie Moran   DOB: 07-Mar-1981   43 y.o. Female  MRN: [id number] Visit Date: 06/01/2024  Today's healthcare provider: Jolynn Spencer, [name]   Chief Complaint  Patient presents with   Follow-up    2 wk blood pressure f/u- pt reports doing good    Subjective      Discussed the use of AI scribe software for clinical note transcription with the patient, who gave verbal consent to proceed.  History of Present Illness Marie Moran is a 43 year old female with hypertension who presents for blood pressure management.  She experiences fluctuations in her blood pressure. She does not smoke and consumes coffee occasionally. Hypertension, follow-up  BP Readings from Last 3 Encounters:  06/01/24 (!) 130/90  05/17/24 (!) 147/96  10/02/23 128/74   Wt Readings from Last 3 Encounters:  06/01/24 170 lb 12.8 oz (77.5 kg)  05/17/24 173 lb 6.4 oz (78.7 kg)  10/02/23 171 lb (77.6 kg)     She was last seen for hypertension 2 weeks ago.  BP at that visit was 147/96. Management since that visit includes chlorthalidone.  She reports fair compliance with treatment. She is not having side effects.   Symptoms: No chest pain No chest pressure  No palpitations No syncope  No dyspnea No orthopnea  No paroxysmal nocturnal dyspnea No lower extremity edema   Pertinent labs Lab Results  Component Value Date   CHOL 180 09/07/2020   HDL 58 09/07/2020   LDLCALC 112 (H) 09/07/2020   TRIG 51 09/07/2020   CHOLHDL 3.1 09/07/2020   Lab Results  Component Value Date   NA 138 05/18/2024   K 3.7 05/18/2024   CREATININE 0.86 05/18/2024   EGFR 85 05/18/2024   GLUCOSE 107 (H) 05/18/2024   TSH 0.703 05/18/2024     The 10-year ASCVD risk score (Arnett DK, et al., 2019) is: 1.9%  ---------------------------------------------------------------------------------------------------       05/17/2024    4:06 PM 08/27/2022    2:19 PM  Depression screen PHQ 2/9   Decreased Interest 0 1  Down, Depressed, Hopeless 0 0  PHQ - 2 Score 0 1  Altered sleeping  0  Tired, decreased energy  0  Change in appetite  0  Feeling bad or failure about yourself   0  Trouble concentrating  0  Moving slowly or fidgety/restless  0  Suicidal thoughts  0  PHQ-9 Score  1   Difficult doing work/chores  Not difficult at all     Data saved with a previous flowsheet row definition       No data to display          Medications: Outpatient Medications Prior to Visit  Medication Sig   Adalimumab-bwwd (HADLIMA PUSHTOUCH) 40 MG/0.4ML SOAJ Inject into the skin.   chlorthalidone (HYGROTON) 25 MG tablet Take 25 mg by mouth every morning.   chlorthalidone (HYGROTON) 25 MG tablet Take 1 tablet (25 mg total) by mouth daily.   ibuprofen  (ADVIL ) 800 MG tablet Take 1 tablet (800 mg total) by mouth every 8 (eight) hours as needed.   LARIN  24 FE 1-20 MG-MCG(24) tablet Take 1 tablet by mouth daily.   metFORMIN (GLUCOPHAGE-XR) 500 MG 24 hr tablet Take 500 mg by mouth 2 (two) times daily.   Multiple Vitamin (MULTI-VITAMIN) tablet Take 1 tablet by mouth daily.   Semaglutide-Weight Management (WEGOVY) 1 MG/0.5ML SOAJ Inject into the skin.   Semaglutide-Weight Management (WEGOVY) 1.7 MG/0.75ML SOAJ Inject into  the skin.   Sharps Container (GUARDIAN SHARPS COLLECTOR) MISC Use as directed to dispose of injectable medications   No facility-administered medications prior to visit.    Review of Systems All negative Except see HPI       Objective    BP (!) 160/100   Pulse 92   Ht 5' 8 (1.727 m)   Wt 170 lb 12.8 oz (77.5 kg)   LMP 05/14/2024   SpO2 100%   BMI 25.97 kg/m     Physical Exam Vitals reviewed.  Constitutional:      General: She is not in acute distress.    Appearance: Normal appearance. She is well-developed. She is not diaphoretic.  HENT:     Head: Normocephalic and atraumatic.  Eyes:     General: No scleral icterus.    Conjunctiva/sclera:  Conjunctivae normal.  Neck:     Thyroid: No thyromegaly.  Cardiovascular:     Rate and Rhythm: Normal rate and regular rhythm.     Pulses: Normal pulses.     Heart sounds: Normal heart sounds. No murmur heard. Pulmonary:     Effort: Pulmonary effort is normal. No respiratory distress.     Breath sounds: Normal breath sounds. No wheezing, rhonchi or rales.  Musculoskeletal:     Cervical back: Neck supple.     Right lower leg: No edema.     Left lower leg: No edema.  Lymphadenopathy:     Cervical: No cervical adenopathy.  Skin:    General: Skin is warm and dry.     Findings: No rash.  Neurological:     Mental Status: She is alert and oriented to person, place, and time. Mental status is at baseline.  Psychiatric:        Mood and Affect: Mood normal.        Behavior: Behavior normal.      No results found for any visits on 06/01/24.   Assessment & Plan Essential hypertension Recently diagnosed.  Blood pressure elevated: first reading 160/100 Current regimen: valsartan 40mg , may need adjustment. Discussed lifestyle modifications and potential additional medication. - Continue current antihypertensive regimen. - Increase valsartan dosage if BP >150/90 mmHg. - Monitor BP regularly and report. - Avoid salty, spicy foods, caffeine. - Ensure adequate hydration. - Schedule follow-up in three weeks. Will follow-up  No orders of the defined types were placed in this encounter.   No follow-ups on file.   The patient was advised to call back or seek an in-person evaluation if the symptoms worsen or if the condition fails to improve as anticipated.  I discussed the assessment and treatment plan with the patient. The patient was provided an opportunity to ask questions and all were answered. The patient agreed with the plan and demonstrated an understanding of the instructions.  I, Ashtian Villacis, [name] have reviewed all documentation for this visit. The documentation on 06/01/2024   for the exam, diagnosis, procedures, and orders are all accurate and complete.  Jolynn Spencer, [facility], MMS [facility] [phone] (phone) [phone] (fax)  [facility]

## 2024-06-03 NOTE — Progress Notes (Signed)
 [facility] Specialty and Home Delivery Pharmacy Refill Coordination Note    Peggy Rojas, DOB: 04/25/1981  Phone: [phone] (home)       All above HIPAA information was verified with patient.         06/03/2024     9:12 AM   Specialty Rx Medication Refill Questionnaire   Which Medications would you like refilled and shipped? Hadlima    Please list all current allergies: None   Have you missed any doses in the last 30 days? No   Have you had any changes to your medication(s) since your last refill? No   How much of each medication do you have remaining at home? (eg. number of tablets, injections, etc.) 0   If receiving an injectable medication, next injection date is 06/11/2024   Have you experienced any side effects in the last 30 days? No   Please enter the full address (street address, city, state, zip code) where you would like your medication(s) to be delivered to. [address] [employer] [place] [address]   Please specify on which day you would like your medication(s) to arrive. Note: if you need your medication(s) within 3 days, please call the pharmacy to schedule your order at [phone]  06/08/2024   Has your insurance changed since your last refill? No   Would you like a pharmacist to call you to discuss your medication(s)? No   Do you require a signature for your package? (Note: if we are billing Medicare Part B or your order contains a controlled substance, we will require a signature) No   I have been provided my out of pocket cost for my medication and approve the pharmacy to charge the amount to my credit card on file. Yes         Completed refill call assessment today to schedule patient's medication shipment from the [facility] and Home Delivery Pharmacy [phone]).  All relevant notes have been reviewed.       Confirmed patient received a Conservation Officer, Historic Buildings and a Surveyor, Mining with first shipment. The patient will receive a drug information handout for each medication shipped and additional FDA Medication Guides as required.         REFERRAL TO PHARMACIST     Referral to the pharmacist: Not needed      [facility]     Shipping address confirmed in Epic.     Delivery Scheduled: Yes, Expected medication delivery date: 06/08/2024.     Medication will be delivered via Same Day Courier to the prescription address in Epic WAM.     Laymon Duty   [facility] Specialty and Home Delivery Pharmacy Specialty Technician

## 2024-06-08 MED FILL — HADLIMA(CF) PUSHTOUCH 40 MG/0.4 ML SUBCUTANEOUS AUTO-INJECTOR: SUBCUTANEOUS | 28 days supply | Qty: 0.8 | Fill #4

## 2024-06-15 MED FILL — METFORMIN ER 500 MG TABLET,EXTENDED RELEASE 24 HR: ORAL | 90 days supply | Qty: 180 | Fill #2

## 2024-06-19 MED ORDER — WEGOVY 1 MG/0.5 ML SUBCUTANEOUS PEN INJECTOR
SUBCUTANEOUS | 3 refills | 28.00000 days
Start: 2024-06-19 — End: ?

## 2024-06-21 NOTE — Telephone Encounter (Signed)
 Patient is requesting the following refill  Requested Prescriptions     Pending Prescriptions Disp Refills    WEGOVY  1 MG/0.5 ML SUBCUTANEOUS PEN INJECTOR 2 mL 3     Sig: Inject 1 mg under the skin every seven (7) days.       Recent Visits  Date Type Provider Dept   02/11/24 Office Visit Ro, Lauraine Gunner, MD [facility] [address] [facility]   07/29/23 Office Visit Ro, Lauraine Gunner, MD [facility] [address] [place]   Showing recent visits within past 365 days and meeting all other requirements  Future Appointments  Date Type Provider Dept   08/17/24 Appointment Ro, Lauraine Gunner, MD  Family Medicine [address] [place]   Showing future appointments within next 365 days and meeting all other requirements       Labs: Not applicable this refill

## 2024-06-22 ENCOUNTER — Ambulatory Visit: Admitting: Physician Assistant

## 2024-06-22 MED ORDER — WEGOVY 1 MG/0.5 ML SUBCUTANEOUS PEN INJECTOR
SUBCUTANEOUS | 1 refills | 28.00000 days | Status: CP
Start: 2024-06-22 — End: ?
  Filled 2024-06-23: qty 2, 28d supply, fill #0

## 2024-06-27 NOTE — Progress Notes (Unsigned)
 Established patient visit  Patient: Marie Moran   DOB: 26-Feb-1981   43 y.o. Female  MRN: [id number] Visit Date: 06/28/2024  Today's healthcare provider: Jolynn Spencer, [name]   No chief complaint on file.  Subjective       Discussed the use of AI scribe software for clinical note transcription with the patient, who gave verbal consent to proceed.  History of Present Illness        05/17/2024    4:06 PM 08/27/2022    2:19 PM  Depression screen PHQ 2/9  Decreased Interest 0 1  Down, Depressed, Hopeless 0 0  PHQ - 2 Score 0 1  Altered sleeping  0  Tired, decreased energy  0  Change in appetite  0  Feeling bad or failure about yourself   0  Trouble concentrating  0  Moving slowly or fidgety/restless  0  Suicidal thoughts  0  PHQ-9 Score  1   Difficult doing work/chores  Not difficult at all     Data saved with a previous flowsheet row definition       No data to display          Medications: Show/hide medication list[1]  Review of Systems  All other systems reviewed and are negative.  All negative Except see HPI   {Insert previous labs (optional):23779} {See past labs  Heme  Chem  Endocrine  Serology  Results Review (optional):1}   Objective    There were no vitals taken for this visit. {Insert last BP/Wt (optional):23777}{See vitals history (optional):1}   Physical Exam Vitals reviewed.  Constitutional:      General: She is not in acute distress.    Appearance: Normal appearance. She is well-developed. She is not diaphoretic.  HENT:     Head: Normocephalic and atraumatic.  Eyes:     General: No scleral icterus.    Conjunctiva/sclera: Conjunctivae normal.  Neck:     Thyroid: No thyromegaly.  Cardiovascular:     Rate and Rhythm: Normal rate and regular rhythm.     Pulses: Normal pulses.     Heart sounds: Normal heart sounds. No murmur heard. Pulmonary:     Effort: Pulmonary effort is normal. No respiratory distress.     Breath sounds:  Normal breath sounds. No wheezing, rhonchi or rales.  Musculoskeletal:     Cervical back: Neck supple.     Right lower leg: No edema.     Left lower leg: No edema.  Lymphadenopathy:     Cervical: No cervical adenopathy.  Skin:    General: Skin is warm and dry.     Findings: No rash.  Neurological:     Mental Status: She is alert and oriented to person, place, and time. Mental status is at baseline.  Psychiatric:        Mood and Affect: Mood normal.        Behavior: Behavior normal.      No results found for any visits on 06/28/24.      Assessment and Plan Assessment & Plan     No orders of the defined types were placed in this encounter.   No follow-ups on file.   The patient was advised to call back or seek an in-person evaluation if the symptoms worsen or if the condition fails to improve as anticipated.  I discussed the assessment and treatment plan with the patient. The patient was provided an opportunity to ask questions and all were answered. The patient agreed with the plan  and demonstrated an understanding of the instructions.  I, Roshelle Traub, [name] have reviewed all documentation for this visit. The documentation on 06/28/2024  for the exam, diagnosis, procedures, and orders are all accurate and complete.  Jolynn Spencer, [facility], MMS [facility] [phone] (phone) [phone] (fax)  [facility]    [1]  Outpatient Medications Prior to Visit  Medication Sig   Adalimumab-bwwd (HADLIMA PUSHTOUCH) 40 MG/0.4ML SOAJ Inject into the skin.   chlorthalidone  (HYGROTON ) 25 MG tablet Take 25 mg by mouth every morning.   chlorthalidone  (HYGROTON ) 25 MG tablet Take 1 tablet (25 mg total) by mouth daily.   ibuprofen  (ADVIL ) 800 MG tablet Take 1 tablet (800 mg total) by mouth every 8 (eight) hours as needed.   LARIN  24 FE 1-20 MG-MCG(24) tablet Take 1 tablet by mouth daily.   metFORMIN (GLUCOPHAGE-XR) 500 MG 24 hr tablet Take 500 mg by mouth  2 (two) times daily.   Multiple Vitamin (MULTI-VITAMIN) tablet Take 1 tablet by mouth daily.   Semaglutide-Weight Management (WEGOVY) 1 MG/0.5ML SOAJ Inject into the skin.   Semaglutide-Weight Management (WEGOVY) 1.7 MG/0.75ML SOAJ Inject into the skin.   Transport Planner (GUARDIAN SHARPS COLLECTOR) MISC Use as directed to dispose of injectable medications   valsartan  (DIOVAN ) 40 MG tablet Take 1 tablet (40 mg total) by mouth daily.   No facility-administered medications prior to visit.

## 2024-06-28 ENCOUNTER — Ambulatory Visit: Admitting: Physician Assistant

## 2024-06-28 ENCOUNTER — Encounter: Payer: Self-pay | Admitting: Physician Assistant

## 2024-06-28 VITALS — BP 135/91 | HR 82 | Resp 14 | Ht 68.0 in | Wt 171.6 lb

## 2024-06-28 DIAGNOSIS — I10 Essential (primary) hypertension: Secondary | ICD-10-CM

## 2024-06-29 NOTE — Progress Notes (Signed)
 [facility] Specialty and Home Delivery Pharmacy Refill Coordination Note    Specialty Medication(s) to be Shipped:   Inflammatory Disorders: Hadlima     Other medication(s) to be shipped: No additional medications requested for fill at this time    Specialty Medications not needed at this time: N/A     Peggy Rojas, DOB: 1981-03-10  Phone: [phone] (home)       All above HIPAA information was verified with patient.     Was a nurse, learning disability used for this call? No    Completed refill call assessment today to schedule patient's medication shipment from the [facility] and Home Delivery Pharmacy  [phone]).  All relevant notes have been reviewed.     Specialty medication(s) and dose(s) confirmed: Regimen is correct and unchanged.   Changes to medications: Eila reports no changes at this time.  Changes to insurance: No  New side effects reported not previously addressed with a pharmacist or physician: None reported  Questions for the pharmacist: No    Confirmed patient received a Conservation Officer, Historic Buildings and a Surveyor, Mining with first shipment. The patient will receive a drug information handout for each medication shipped and additional FDA Medication Guides as required.       DISEASE/MEDICATION-SPECIFIC INFORMATION        N/A    SPECIALTY MEDICATION ADHERENCE     Medication Adherence    Patient reported X missed doses in the last month: 0  Specialty Medication: adalimumab -bwwd: HADLIMA (CF) PUSHTOUCH 40 mg/0.4 mL Atin  Patient is on additional specialty medications: No              Were doses missed due to medication being on hold? No     adalimumab -bwwd: HADLIMA (CF) PUSHTOUCH 40 mg/0.4 mL Atin: 0 doses of medicine on hand       Specialty medication is an injection or given on a cycle: Yes, Next injection is scheduled for 07/02/2024.    REFERRAL TO PHARMACIST     Referral to the pharmacist: Not needed      [facility]     Shipping address confirmed in Epic.     Cost and Payment: Patient has a $0 copay, payment information is not required.    Delivery Scheduled: Yes, Expected medication delivery date: 07/01/2024.     Medication will be delivered via Next Day Courier to the prescription address in Epic WAM.    Lucie CHRISTELLA Forts   [facility] Specialty and Home Delivery Pharmacy  Specialty Technician

## 2024-06-30 MED FILL — HADLIMA(CF) PUSHTOUCH 40 MG/0.4 ML SUBCUTANEOUS AUTO-INJECTOR: SUBCUTANEOUS | 28 days supply | Qty: 0.8 | Fill #5

## 2024-07-27 MED FILL — WEGOVY 1 MG/0.5 ML SUBCUTANEOUS PEN INJECTOR: SUBCUTANEOUS | 28 days supply | Qty: 2 | Fill #1

## 2024-07-27 NOTE — Progress Notes (Signed)
 [facility] Specialty and Home Delivery Pharmacy Refill Coordination Note    Specialty Medication(s) to be Shipped:   Inflammatory Disorders: Hadlima     Other medication(s) to be shipped: No additional medications requested for fill at this time    Specialty Medications not needed at this time: N/A     Peggy Rojas, DOB: 1980-08-19  Phone: [phone] (home)       All above HIPAA information was verified with patient.     Was a nurse, learning disability used for this call? No    Completed refill call assessment today to schedule patient's medication shipment from the [facility] and Home Delivery Pharmacy  [phone]).  All relevant notes have been reviewed.     Specialty medication(s) and dose(s) confirmed: Regimen is correct and unchanged.   Changes to medications: Recie reports no changes at this time.  Changes to insurance: No  New side effects reported not previously addressed with a pharmacist or physician: None reported  Questions for the pharmacist: No    Confirmed patient received a Conservation Officer, Historic Buildings and a Surveyor, Mining with first shipment. The patient will receive a drug information handout for each medication shipped and additional FDA Medication Guides as required.       DISEASE/MEDICATION-SPECIFIC INFORMATION        N/A    SPECIALTY MEDICATION ADHERENCE     Medication Adherence    Patient reported X missed doses in the last month: 0  Specialty Medication: hadlima  pushtouch 40mg /0.54ml              Were doses missed due to medication being on hold? No    hadlima  pushtouch 40mg /0.70ml   : 0 doses of medicine on hand       Specialty medication is an injection or given on a cycle: Yes, Next injection is scheduled for 1/23.    REFERRAL TO PHARMACIST     Referral to the pharmacist: Not needed      [facility]     Shipping address confirmed in Epic.     Cost and Payment: Patient has a $0 copay, payment information is not required.    Delivery Scheduled: Yes, Expected medication delivery date: 1/16. Medication will be delivered via Next Day Courier to the prescription address in Epic WAM.    Dorothyann JAYSON Lands   [facility] Specialty and Home Delivery Pharmacy  Specialty Technician

## 2024-07-29 MED FILL — HADLIMA(CF) PUSHTOUCH 40 MG/0.4 ML SUBCUTANEOUS AUTO-INJECTOR: SUBCUTANEOUS | 28 days supply | Qty: 0.8 | Fill #6

## 2024-08-07 ENCOUNTER — Other Ambulatory Visit: Payer: Self-pay | Admitting: Obstetrics and Gynecology

## 2024-08-07 ENCOUNTER — Other Ambulatory Visit: Payer: Self-pay | Admitting: Physician Assistant

## 2024-08-07 DIAGNOSIS — D219 Benign neoplasm of connective and other soft tissue, unspecified: Secondary | ICD-10-CM

## 2024-08-07 DIAGNOSIS — Z3041 Encounter for surveillance of contraceptive pills: Secondary | ICD-10-CM

## 2024-08-07 DIAGNOSIS — R42 Dizziness and giddiness: Secondary | ICD-10-CM

## 2024-08-09 NOTE — Telephone Encounter (Signed)
 Requested medication (s) are due for refill today: yes   Requested medication (s) are on the active medication list: yes   Last refill:  05/17/24 #90 0 refills  Future visit scheduled: no   Notes to clinic:  last OV 1 month ago. No refills remain. Do you want to refill Rx?     Requested Prescriptions  Pending Prescriptions Disp Refills   chlorthalidone  (HYGROTON ) 25 MG tablet [Pharmacy Med Name: Chlorthalidone  25 MG Oral Tablet] 90 tablet 0    Sig: Take 1 tablet by mouth once daily     Cardiovascular: Diuretics - Thiazide Failed - 08/09/2024 12:00 PM      Failed - Last BP in normal range    BP Readings from Last 1 Encounters:  06/28/24 (!) 135/91         Passed - Cr in normal range and within 180 days    Creatinine, Ser  Date Value Ref Range Status  05/18/2024 0.86 0.57 - 1.00 mg/dL Final         Passed - K in normal range and within 180 days    Potassium  Date Value Ref Range Status  05/18/2024 3.7 3.5 - 5.2 mmol/L Final         Passed - Na in normal range and within 180 days    Sodium  Date Value Ref Range Status  05/18/2024 138 134 - 144 mmol/L Final         Passed - Valid encounter within last 6 months    Recent Outpatient Visits           1 month ago Primary hypertension   [facility] [place], [place], [name]   2 months ago Primary hypertension   [facility] [place], [place], [name]   2 months ago Dizziness   [facility] [place], [place], [name]
